# Patient Record
Sex: Male | Born: 1955 | Race: White | Hispanic: No | Marital: Married | Smoking: Former smoker
Health system: Southern US, Academic
[De-identification: ages and names within clinical notes are randomized; demographics above are authoritative.]

## PROBLEM LIST (undated history)

## (undated) DIAGNOSIS — D649 Anemia, unspecified: Secondary | ICD-10-CM

## (undated) DIAGNOSIS — E119 Type 2 diabetes mellitus without complications: Secondary | ICD-10-CM

## (undated) DIAGNOSIS — Z872 Personal history of diseases of the skin and subcutaneous tissue: Secondary | ICD-10-CM

## (undated) DIAGNOSIS — K635 Polyp of colon: Secondary | ICD-10-CM

## (undated) DIAGNOSIS — R9430 Abnormal result of cardiovascular function study, unspecified: Secondary | ICD-10-CM

## (undated) DIAGNOSIS — R339 Retention of urine, unspecified: Secondary | ICD-10-CM

## (undated) DIAGNOSIS — I2510 Atherosclerotic heart disease of native coronary artery without angina pectoris: Secondary | ICD-10-CM

## (undated) DIAGNOSIS — Z9989 Dependence on other enabling machines and devices: Secondary | ICD-10-CM

## (undated) DIAGNOSIS — L03119 Cellulitis of unspecified part of limb: Secondary | ICD-10-CM

## (undated) DIAGNOSIS — G629 Polyneuropathy, unspecified: Secondary | ICD-10-CM

## (undated) DIAGNOSIS — M109 Gout, unspecified: Secondary | ICD-10-CM

## (undated) DIAGNOSIS — R931 Abnormal findings on diagnostic imaging of heart and coronary circulation: Secondary | ICD-10-CM

## (undated) DIAGNOSIS — N183 Chronic kidney disease, stage 3 unspecified (CMS HCC): Secondary | ICD-10-CM

## (undated) DIAGNOSIS — Z7901 Long term (current) use of anticoagulants: Secondary | ICD-10-CM

## (undated) DIAGNOSIS — K5900 Constipation, unspecified: Secondary | ICD-10-CM

## (undated) DIAGNOSIS — Z950 Presence of cardiac pacemaker: Secondary | ICD-10-CM

## (undated) DIAGNOSIS — R609 Edema, unspecified: Secondary | ICD-10-CM

## (undated) DIAGNOSIS — M1990 Unspecified osteoarthritis, unspecified site: Secondary | ICD-10-CM

## (undated) DIAGNOSIS — I509 Heart failure, unspecified: Secondary | ICD-10-CM

## (undated) DIAGNOSIS — I4891 Unspecified atrial fibrillation: Secondary | ICD-10-CM

## (undated) DIAGNOSIS — I429 Cardiomyopathy, unspecified: Secondary | ICD-10-CM

## (undated) DIAGNOSIS — I10 Essential (primary) hypertension: Secondary | ICD-10-CM

## (undated) DIAGNOSIS — E785 Hyperlipidemia, unspecified: Secondary | ICD-10-CM

## (undated) HISTORY — PX: HX PACEMAKER DEFIBRILLATOR PLACEMENT: SHX43

## (undated) HISTORY — PX: HX COLONOSCOPY: 2100001147

## (undated) HISTORY — PX: HX CORONARY ARTERY BYPASS GRAFT: SHX141

## (undated) HISTORY — PX: HX CARDIAC ABLATION: 2100001323

---
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
## 2013-08-28 DIAGNOSIS — M869 Osteomyelitis, unspecified: Secondary | ICD-10-CM

## 2013-08-28 HISTORY — DX: Osteomyelitis, unspecified (CMS HCC): M86.9

## 2017-06-15 DIAGNOSIS — K8020 Calculus of gallbladder without cholecystitis without obstruction: Secondary | ICD-10-CM | POA: Insufficient documentation

## 2017-11-10 NOTE — Care Management Notes (Signed)
MARS INTAKE    Insurance Information:    Referring Provider and Contact Number:  Jerrell Belfast NP  Transfer Source and Contact Number:   Eula Listen [phone]  Transfer Emergent: emergent     Date Admitted:  11/10/2017  Diagnosis:  Urechal cyst, urinary retention , hypotensive   PMH:  DM2, A-Fib, Obesity, CHF, CKD, Pacemaker 2015      Dialysis:  no     Cancer:  no     Isolation:  no     Is patient established with family practice/attending?  no  Reason for transfer:  Higher level of care     Patient story/clinical presentation: As per Jerrell Belfast NP, patient admitted 11/10/2017 for UTI and hypotensive. Patient improving after getting NS boluses currently 119/93. CT scan shown urechal cyst. Patient had a few cc in bladder and unable to void. Staff unable to get foley placed. Morris NP spoke with Dr. Matthew Folks who recommended medicine admission. Patient Flu negative as well. Patient accepted to medicine step down.     qSOFA Score >= 2 = Positive                 Value                   Score    Respiratory Rate >= 22 breaths per minute = 1 point 20 0   Systolic Blood Pressure <= 100 mmHg = 1 point 119 0   Altered Mental Status: GCS <15 = 1 point Lethargic (improving with fluids) 1   Total Score   1     Serum Lactate Level >= 13mol/L (36 mg/ dL)= Positive:  Initial call level (if not available put NA) 3.0/2.6   Followed up call level        Positive Screen: no    Current vital signs:    HR:     77   BP:     119/93   Resp:     20   Temp:     38.0   Sats:     95   O2:     2LNC       Per MD labs WNL unless noted below.    Labs:  WBC (3.6-11) 10.4   HGB (13.1-17.3)    Hct (39.8-50.2)    PLT (140-400)    Na (135-145)    K+ (3.5-5.1)    CL (96-111)    CO2 (23-35)    BUN (8-26)    Cr (0.62-1.27) 3.6   Glucose (60-105)    Ca (8.5-10.4)    Mag (1.6-2.5)    Phos (2.4-4.7)    BNP (<100)    D-Dimer (<233)    AST (8-41)    ALT (<55)    Alk Phos (<150)    Amylase (25-125)    Lipase (10-80)    T. Bili (0.3-1.3)    PT (8.7-13.2)    PTT  (25.1-36.5)    INR (0.8-1.2) 2.0   Trip-1 (<0.03)    CK    CPK    CK-MB    ABG ph    ABG PCO2    ABG PO2    ABG HCO3    Base def/exc    LP Glucose    LP Neutrophil    LP Protein    LP WBC    LP Other            Radiology (please place images on image grid): CT scan abdomen shown 4.2 structure at dome  of bladder, urechal cyst  EKG:  IV Access:  Medications, IVF, Drips: Zosyn 3.75, Vanc 1g, NS boluses     Oxygen/Bipap/Vent settings:    TV:        Peep:        FiO2:        Rate:            Pt class and accommodation: inpatient, step down   Service and accepting MD: medicine. Dr. Westley Hummer    *Send Text Page to accepting service MD. *

## 2017-11-11 ENCOUNTER — Inpatient Hospital Stay (HOSPITAL_COMMUNITY): Payer: No Typology Code available for payment source

## 2017-11-11 ENCOUNTER — Encounter (HOSPITAL_COMMUNITY): Payer: Self-pay | Admitting: Anatomic Pathology & Clinical Pathology

## 2017-11-11 ENCOUNTER — Inpatient Hospital Stay (HOSPITAL_COMMUNITY): Payer: No Typology Code available for payment source | Admitting: Pediatrics

## 2017-11-11 ENCOUNTER — Inpatient Hospital Stay
Admission: EM | Admit: 2017-11-11 | Discharge: 2017-11-22 | DRG: 871 | Disposition: A | Discharge status: Home Health | Payer: No Typology Code available for payment source | Source: Other Acute Inpatient Hospital | Attending: Internal Medicine | Admitting: Internal Medicine

## 2017-11-11 DIAGNOSIS — E1122 Type 2 diabetes mellitus with diabetic chronic kidney disease: Secondary | ICD-10-CM | POA: Diagnosis present

## 2017-11-11 DIAGNOSIS — R578 Other shock: Secondary | ICD-10-CM

## 2017-11-11 DIAGNOSIS — I272 Pulmonary hypertension, unspecified: Secondary | ICD-10-CM | POA: Diagnosis present

## 2017-11-11 DIAGNOSIS — I130 Hypertensive heart and chronic kidney disease with heart failure and stage 1 through stage 4 chronic kidney disease, or unspecified chronic kidney disease: Secondary | ICD-10-CM | POA: Diagnosis present

## 2017-11-11 DIAGNOSIS — Z7901 Long term (current) use of anticoagulants: Secondary | ICD-10-CM

## 2017-11-11 DIAGNOSIS — N179 Acute kidney failure, unspecified: Secondary | ICD-10-CM

## 2017-11-11 DIAGNOSIS — R339 Retention of urine, unspecified: Secondary | ICD-10-CM

## 2017-11-11 DIAGNOSIS — K8020 Calculus of gallbladder without cholecystitis without obstruction: Secondary | ICD-10-CM

## 2017-11-11 DIAGNOSIS — K828 Other specified diseases of gallbladder: Secondary | ICD-10-CM

## 2017-11-11 DIAGNOSIS — N401 Enlarged prostate with lower urinary tract symptoms: Secondary | ICD-10-CM | POA: Diagnosis present

## 2017-11-11 DIAGNOSIS — M7981 Nontraumatic hematoma of soft tissue: Secondary | ICD-10-CM | POA: Diagnosis present

## 2017-11-11 DIAGNOSIS — L97319 Non-pressure chronic ulcer of right ankle with unspecified severity: Secondary | ICD-10-CM | POA: Diagnosis present

## 2017-11-11 DIAGNOSIS — N170 Acute kidney failure with tubular necrosis: Secondary | ICD-10-CM | POA: Diagnosis present

## 2017-11-11 DIAGNOSIS — E11622 Type 2 diabetes mellitus with other skin ulcer: Secondary | ICD-10-CM | POA: Diagnosis present

## 2017-11-11 DIAGNOSIS — E440 Moderate protein-calorie malnutrition: Secondary | ICD-10-CM | POA: Diagnosis present

## 2017-11-11 DIAGNOSIS — E1142 Type 2 diabetes mellitus with diabetic polyneuropathy: Secondary | ICD-10-CM | POA: Diagnosis present

## 2017-11-11 DIAGNOSIS — R6521 Severe sepsis with septic shock: Secondary | ICD-10-CM

## 2017-11-11 DIAGNOSIS — I255 Ischemic cardiomyopathy: Secondary | ICD-10-CM | POA: Diagnosis present

## 2017-11-11 DIAGNOSIS — Z6841 Body Mass Index (BMI) 40.0 and over, adult: Secondary | ICD-10-CM

## 2017-11-11 DIAGNOSIS — Z79899 Other long term (current) drug therapy: Secondary | ICD-10-CM

## 2017-11-11 DIAGNOSIS — I509 Heart failure, unspecified: Secondary | ICD-10-CM

## 2017-11-11 DIAGNOSIS — S81811A Laceration without foreign body, right lower leg, initial encounter: Secondary | ICD-10-CM | POA: Diagnosis present

## 2017-11-11 DIAGNOSIS — L03115 Cellulitis of right lower limb: Secondary | ICD-10-CM | POA: Diagnosis present

## 2017-11-11 DIAGNOSIS — Z794 Long term (current) use of insulin: Secondary | ICD-10-CM

## 2017-11-11 DIAGNOSIS — T83098A Other mechanical complication of other indwelling urethral catheter, initial encounter: Secondary | ICD-10-CM

## 2017-11-11 DIAGNOSIS — E7800 Pure hypercholesterolemia, unspecified: Secondary | ICD-10-CM | POA: Diagnosis present

## 2017-11-11 DIAGNOSIS — N189 Chronic kidney disease, unspecified: Secondary | ICD-10-CM

## 2017-11-11 DIAGNOSIS — E785 Hyperlipidemia, unspecified: Secondary | ICD-10-CM | POA: Diagnosis present

## 2017-11-11 DIAGNOSIS — I890 Lymphedema, not elsewhere classified: Secondary | ICD-10-CM | POA: Diagnosis present

## 2017-11-11 DIAGNOSIS — I2510 Atherosclerotic heart disease of native coronary artery without angina pectoris: Secondary | ICD-10-CM

## 2017-11-11 DIAGNOSIS — Z7952 Long term (current) use of systemic steroids: Secondary | ICD-10-CM

## 2017-11-11 DIAGNOSIS — R9431 Abnormal electrocardiogram [ECG] [EKG]: Secondary | ICD-10-CM

## 2017-11-11 DIAGNOSIS — Z95810 Presence of automatic (implantable) cardiac defibrillator: Secondary | ICD-10-CM

## 2017-11-11 DIAGNOSIS — Z7982 Long term (current) use of aspirin: Secondary | ICD-10-CM

## 2017-11-11 DIAGNOSIS — I959 Hypotension, unspecified: Secondary | ICD-10-CM

## 2017-11-11 DIAGNOSIS — R570 Cardiogenic shock: Secondary | ICD-10-CM | POA: Diagnosis present

## 2017-11-11 DIAGNOSIS — Z9861 Coronary angioplasty status: Secondary | ICD-10-CM

## 2017-11-11 DIAGNOSIS — I5023 Acute on chronic systolic (congestive) heart failure: Secondary | ICD-10-CM | POA: Diagnosis not present

## 2017-11-11 DIAGNOSIS — Z951 Presence of aortocoronary bypass graft: Secondary | ICD-10-CM

## 2017-11-11 DIAGNOSIS — Z87891 Personal history of nicotine dependence: Secondary | ICD-10-CM

## 2017-11-11 DIAGNOSIS — N183 Chronic kidney disease, stage 3 (moderate): Secondary | ICD-10-CM

## 2017-11-11 DIAGNOSIS — T45515A Adverse effect of anticoagulants, initial encounter: Secondary | ICD-10-CM | POA: Diagnosis present

## 2017-11-11 DIAGNOSIS — M549 Dorsalgia, unspecified: Secondary | ICD-10-CM | POA: Diagnosis not present

## 2017-11-11 DIAGNOSIS — I10 Essential (primary) hypertension: Secondary | ICD-10-CM

## 2017-11-11 DIAGNOSIS — I472 Ventricular tachycardia: Secondary | ICD-10-CM | POA: Diagnosis not present

## 2017-11-11 DIAGNOSIS — R748 Abnormal levels of other serum enzymes: Secondary | ICD-10-CM

## 2017-11-11 DIAGNOSIS — A419 Sepsis, unspecified organism: Principal | ICD-10-CM | POA: Diagnosis present

## 2017-11-11 DIAGNOSIS — I517 Cardiomegaly: Secondary | ICD-10-CM

## 2017-11-11 DIAGNOSIS — R338 Other retention of urine: Secondary | ICD-10-CM | POA: Diagnosis present

## 2017-11-11 DIAGNOSIS — E876 Hypokalemia: Secondary | ICD-10-CM | POA: Diagnosis not present

## 2017-11-11 DIAGNOSIS — E1165 Type 2 diabetes mellitus with hyperglycemia: Secondary | ICD-10-CM | POA: Diagnosis present

## 2017-11-11 DIAGNOSIS — I872 Venous insufficiency (chronic) (peripheral): Secondary | ICD-10-CM

## 2017-11-11 DIAGNOSIS — N390 Urinary tract infection, site not specified: Secondary | ICD-10-CM | POA: Diagnosis present

## 2017-11-11 DIAGNOSIS — I482 Chronic atrial fibrillation: Secondary | ICD-10-CM | POA: Diagnosis present

## 2017-11-11 DIAGNOSIS — M069 Rheumatoid arthritis, unspecified: Secondary | ICD-10-CM | POA: Diagnosis present

## 2017-11-11 DIAGNOSIS — R1310 Dysphagia, unspecified: Secondary | ICD-10-CM | POA: Diagnosis present

## 2017-11-11 DIAGNOSIS — I4891 Unspecified atrial fibrillation: Secondary | ICD-10-CM

## 2017-11-11 DIAGNOSIS — G4733 Obstructive sleep apnea (adult) (pediatric): Secondary | ICD-10-CM | POA: Diagnosis present

## 2017-11-11 DIAGNOSIS — R162 Hepatomegaly with splenomegaly, not elsewhere classified: Secondary | ICD-10-CM

## 2017-11-11 DIAGNOSIS — E119 Type 2 diabetes mellitus without complications: Secondary | ICD-10-CM

## 2017-11-11 DIAGNOSIS — M109 Gout, unspecified: Secondary | ICD-10-CM | POA: Diagnosis present

## 2017-11-11 DIAGNOSIS — N12 Tubulo-interstitial nephritis, not specified as acute or chronic: Secondary | ICD-10-CM | POA: Diagnosis present

## 2017-11-11 DIAGNOSIS — N329 Bladder disorder, unspecified: Secondary | ICD-10-CM

## 2017-11-11 DIAGNOSIS — N35919 Unspecified urethral stricture, male, unspecified site: Secondary | ICD-10-CM | POA: Diagnosis present

## 2017-11-11 DIAGNOSIS — D649 Anemia, unspecified: Secondary | ICD-10-CM | POA: Diagnosis not present

## 2017-11-11 HISTORY — DX: Chronic kidney disease, stage 3 unspecified (CMS HCC): N18.30

## 2017-11-11 HISTORY — DX: Constipation, unspecified: K59.00

## 2017-11-11 HISTORY — DX: Edema, unspecified: R60.9

## 2017-11-11 HISTORY — DX: Retention of urine, unspecified: R33.9

## 2017-11-11 HISTORY — DX: Polyp of colon: K63.5

## 2017-11-11 HISTORY — DX: Cellulitis of unspecified part of limb: L03.119

## 2017-11-11 HISTORY — DX: Presence of cardiac pacemaker: Z95.0

## 2017-11-11 HISTORY — DX: Essential (primary) hypertension: I10

## 2017-11-11 HISTORY — DX: Unspecified atrial fibrillation (CMS HCC): I48.91

## 2017-11-11 HISTORY — DX: Unspecified osteoarthritis, unspecified site: M19.90

## 2017-11-11 HISTORY — DX: Long term (current) use of anticoagulants: Z79.01

## 2017-11-11 HISTORY — DX: Heart failure, unspecified (CMS HCC): I50.9

## 2017-11-11 HISTORY — DX: Anemia, unspecified: D64.9

## 2017-11-11 HISTORY — DX: Gout, unspecified: M10.9

## 2017-11-11 HISTORY — DX: Polyneuropathy, unspecified: G62.9

## 2017-11-11 HISTORY — DX: Morbid (severe) obesity due to excess calories (CMS HCC): E66.01

## 2017-11-11 HISTORY — DX: Type 2 diabetes mellitus without complications (CMS HCC): E11.9

## 2017-11-11 HISTORY — DX: Atherosclerotic heart disease of native coronary artery without angina pectoris: I25.10

## 2017-11-11 HISTORY — DX: Personal history of diseases of the skin and subcutaneous tissue: Z87.2

## 2017-11-11 HISTORY — DX: Hyperlipidemia, unspecified: E78.5

## 2017-11-11 HISTORY — DX: Dependence on other enabling machines and devices: Z99.89

## 2017-11-11 HISTORY — DX: Cardiomyopathy, unspecified (CMS HCC): I42.9

## 2017-11-11 HISTORY — DX: Abnormal result of cardiovascular function study, unspecified: R94.30

## 2017-11-11 LAB — TYPE AND SCREEN
ABO/RH(D): A NEG
ANTIBODY SCREEN: NEGATIVE

## 2017-11-11 LAB — URINALYSIS, MICROSCOPIC
HYALINE CASTS: 4 /lpf — ABNORMAL HIGH (ref <4.0)
RBCS: 3 /hpf (ref <6.0)
WBCS: 22 /hpf — ABNORMAL HIGH (ref <4.0)

## 2017-11-11 LAB — HEPATIC FUNCTION PANEL
ALBUMIN: 2.6 g/dL — ABNORMAL LOW (ref 3.4–4.8)
ALBUMIN: 2.6 g/dL — ABNORMAL LOW (ref 3.4–4.8)
ALKALINE PHOSPHATASE: 93 U/L (ref <150)
ALKALINE PHOSPHATASE: 93 U/L (ref <150)
ALT (SGPT): 10 U/L (ref <55)
ALT (SGPT): 9 U/L (ref <55)
AST (SGOT): 35 U/L (ref 8–48)
AST (SGOT): 36 U/L (ref 8–48)
BILIRUBIN DIRECT: 2.1 mg/dL — ABNORMAL HIGH (ref <0.3)
BILIRUBIN DIRECT: 2.2 mg/dL — ABNORMAL HIGH (ref <0.3)
BILIRUBIN TOTAL: 3 mg/dL — ABNORMAL HIGH (ref 0.3–1.3)
BILIRUBIN TOTAL: 2.9 mg/dL — ABNORMAL HIGH (ref 0.3–1.3)
PROTEIN TOTAL: 6.3 g/dL (ref 6.0–8.0)
PROTEIN TOTAL: 6.1 g/dL (ref 6.0–8.0)

## 2017-11-11 LAB — VENOUS BLOOD GAS/LACTATE
%FIO2 (VENOUS): 32 %
BASE DEFICIT: 0.8 mmol/L (ref <=3.0)
BICARBONATE (VENOUS): 23.5 mmol/L (ref 22.0–26.0)
LACTATE: 1.8 mmol/L — ABNORMAL HIGH (ref 0.0–1.3)
PCO2 (VENOUS): 44 mm/Hg (ref 41.00–51.00)
PH (VENOUS): 7.36 (ref 7.31–7.41)
PO2 (VENOUS): 37 mm/Hg (ref 35.0–50.0)

## 2017-11-11 LAB — TROPONIN-I
TROPONIN I: 78 ng/L — ABNORMAL HIGH (ref 0–30)
TROPONIN I: 56 ng/L — ABNORMAL HIGH (ref 0–30)

## 2017-11-11 LAB — CBC WITH DIFF
BASOPHIL #: 0.05 x10ˆ3/uL (ref 0.00–0.20)
EOSINOPHIL #: 0 x10ˆ3/uL (ref 0.00–0.50)
HGB: 12.3 g/dL — ABNORMAL LOW (ref 12.5–16.3)
LYMPHOCYTE #: 0.28 x10ˆ3/uL — ABNORMAL LOW (ref 1.00–4.80)
MCH: 32 pg (ref 27.4–33.0)
MCHC: 33.3 g/dL (ref 32.5–35.8)
MCV: 96.2 fL (ref 78.0–100.0)
MONOCYTE #: 0.49 x10ˆ3/uL (ref 0.30–1.00)
MPV: 9.1 fL (ref 7.5–11.5)
NEUTROPHIL #: 8.87 x10ˆ3/uL — ABNORMAL HIGH (ref 1.50–7.70)
PLATELETS: 108 x10ˆ3/uL — ABNORMAL LOW (ref 140–450)
RBC: 3.83 x10ˆ6/uL — ABNORMAL LOW (ref 4.06–5.63)
RDW: 19.5 % — ABNORMAL HIGH (ref 12.0–15.0)

## 2017-11-11 LAB — URINALYSIS, MACROSCOPIC
BILIRUBIN: NEGATIVE mg/dL
NITRITE: NEGATIVE
PH: 6 (ref 5.0–8.0)
PROTEIN: 30 mg/dL — AB

## 2017-11-11 LAB — FIBRINOGEN: FIBRINOGEN: 550 mg/dL — ABNORMAL HIGH (ref 200–400)

## 2017-11-11 LAB — BASIC METABOLIC PANEL
ANION GAP: 12 mmol/L (ref 4–13)
BUN/CREA RATIO: 24 — ABNORMAL HIGH (ref 6–22)
BUN: 88 mg/dL — ABNORMAL HIGH (ref 8–25)
CALCIUM: 8.1 mg/dL — ABNORMAL LOW (ref 8.5–10.2)
CHLORIDE: 102 mmol/L (ref 96–111)
CO2 TOTAL: 23 mmol/L (ref 22–32)
CREATININE: 3.71 mg/dL — ABNORMAL HIGH (ref 0.62–1.27)
ESTIMATED GFR: 17 mL/min/1.73mˆ2 — ABNORMAL LOW (ref >59)
GLUCOSE: 212 mg/dL — ABNORMAL HIGH (ref 65–139)
POTASSIUM: 4.7 mmol/L (ref 3.5–5.1)
SODIUM: 137 mmol/L (ref 136–145)

## 2017-11-11 LAB — CLOSTRIDIUM DIFFICILE TOXIN DETECTION: CLOSTRIDIUM DIFFICILE TOXIN DETECTION: NEGATIVE

## 2017-11-11 LAB — PT/INR
INR: 2.23 — ABNORMAL HIGH (ref 0.80–1.20)
PROTHROMBIN TIME: 25.7 seconds — ABNORMAL HIGH (ref 9.5–14.1)

## 2017-11-11 LAB — POC BLOOD GLUCOSE (RESULTS): GLUCOSE, POC: 177 mg/dl — ABNORMAL HIGH (ref 70–105)

## 2017-11-11 LAB — PTT (PARTIAL THROMBOPLASTIN TIME): APTT: 82 seconds — ABNORMAL HIGH (ref 24.1–38.5)

## 2017-11-11 LAB — CREATININE URINE, RANDOM: CREATININE RANDOM URINE: 76 mg/dL

## 2017-11-11 LAB — SODIUM, RANDOM URINE: SODIUM RANDOM URINE: 29 mmol/L

## 2017-11-11 LAB — PROCALCITONIN: PROCALCITONIN: 15.54 ng/mL — ABNORMAL HIGH (ref <0.50)

## 2017-11-11 LAB — DIGOXIN LEVEL: DIGOXIN LEVEL: 0.6 ng/mL — ABNORMAL LOW (ref 0.8–2.0)

## 2017-11-11 LAB — MRSA SCREEN, PCR, RAPID: MRSA COLONIZATION SCREEN: NEGATIVE

## 2017-11-11 LAB — UREA NITROGEN, RANDOM URINE: UREA NITROGEN RANDOM URINE: 778 mg/dL

## 2017-11-11 LAB — C-REACTIVE PROTEIN(CRP),INFLAMMATION: CRP INFLAMMATION: 262.4 mg/L — ABNORMAL HIGH (ref <=8.0)

## 2017-11-11 MED ORDER — SENNOSIDES 8.6 MG-DOCUSATE SODIUM 50 MG TABLET
1.0000 | ORAL_TABLET | Freq: Two times a day (BID) | ORAL | Status: DC
Start: 2017-11-11 — End: 2017-11-22
  Administered 2017-11-11: 0 via ORAL
  Administered 2017-11-11 – 2017-11-12 (×3): 1 via ORAL
  Administered 2017-11-13 – 2017-11-15 (×5): 0 via ORAL
  Administered 2017-11-16 – 2017-11-20 (×9): 1 via ORAL
  Administered 2017-11-20: 0 via ORAL
  Administered 2017-11-21: 1 via ORAL
  Administered 2017-11-21 – 2017-11-22 (×2): 0 via ORAL
  Filled 2017-11-11 (×14): qty 1

## 2017-11-11 MED ORDER — SODIUM CHLORIDE 0.9 % (FLUSH) INJECTION SYRINGE
2.0000 mL | INJECTION | Freq: Three times a day (TID) | INTRAMUSCULAR | Status: DC
Start: 2017-11-11 — End: 2017-11-12
  Administered 2017-11-11: 0 mL
  Administered 2017-11-11 (×2): 2 mL
  Administered 2017-11-12 (×2): 0 mL

## 2017-11-11 MED ORDER — NOREPINEPHRINE BITARTRATE 16 MG/250 ML (64 MCG/ML) IN 0.9 % NACL IV
0.0300 ug/kg/min | INTRAVENOUS | Status: DC
Start: 2017-11-11 — End: 2017-11-12
  Administered 2017-11-11 (×3): 0.08 ug/kg/min via INTRAVENOUS
  Administered 2017-11-12: 0.03 ug/kg/min via INTRAVENOUS
  Administered 2017-11-12: 0.08 ug/kg/min via INTRAVENOUS
  Administered 2017-11-12: 0 ug/kg/min via INTRAVENOUS
  Administered 2017-11-12: 0.08 ug/kg/min via INTRAVENOUS
  Administered 2017-11-12: 0.06 ug/kg/min via INTRAVENOUS
  Administered 2017-11-12: 0.04 ug/kg/min via INTRAVENOUS
  Administered 2017-11-12: 0.08 ug/kg/min via INTRAVENOUS
  Filled 2017-11-11 (×2): qty 250

## 2017-11-11 MED ORDER — HYDROMORPHONE 2 MG/ML INJECTION SYRINGE
0.6000 mg | INJECTION | Freq: Once | INTRAMUSCULAR | Status: AC | PRN
Start: 2017-11-11 — End: 2017-11-11

## 2017-11-11 MED ORDER — SODIUM CHLORIDE 0.9 % (FLUSH) INJECTION SYRINGE
2.0000 mL | INJECTION | INTRAMUSCULAR | Status: DC | PRN
Start: 2017-11-11 — End: 2017-11-12

## 2017-11-11 MED ORDER — SODIUM CHLORIDE 0.9 % (FLUSH) INJECTION SYRINGE
2.0000 mL | INJECTION | INTRAMUSCULAR | Status: DC | PRN
Start: 2017-11-11 — End: 2017-11-22

## 2017-11-11 MED ORDER — INSULIN LISPRO 100 UNIT/ML SUB-Q SSIP
3.00 [IU] | INJECTION | Freq: Four times a day (QID) | SUBCUTANEOUS | Status: DC | PRN
Start: 2017-11-11 — End: 2017-11-14
  Administered 2017-11-11: 6 [IU] via SUBCUTANEOUS
  Administered 2017-11-11 – 2017-11-12 (×3): 3 [IU] via SUBCUTANEOUS
  Administered 2017-11-14: 9 [IU] via SUBCUTANEOUS
  Administered 2017-11-14: 3 [IU] via SUBCUTANEOUS
  Filled 2017-11-11: qty 3

## 2017-11-11 MED ORDER — CEFEPIME 2 GRAM/100 ML IN DEXTROSE PREMIX IVPB - EXTENDED INFUSION
2.0000 g | INJECTION | Freq: Once | INTRAVENOUS | Status: AC
Start: 2017-11-11 — End: 2017-11-11
  Administered 2017-11-11: 2 g via INTRAVENOUS
  Administered 2017-11-11: 0 g via INTRAVENOUS
  Filled 2017-11-11: qty 100

## 2017-11-11 MED ORDER — NOREPINEPHRINE BITARTRATE 1 MG/ML INTRAVENOUS SOLUTION
INTRAVENOUS | Status: AC
Start: 2017-11-11 — End: 2017-11-11
  Filled 2017-11-11: qty 12

## 2017-11-11 MED ORDER — PERFLUTREN LIPID MICROSPHERES 1.1 MG/ML INTRAVENOUS SUSPENSION
2.00 mL | INTRAVENOUS | Status: AC
Start: 2017-11-11 — End: 2017-11-12
  Administered 2017-11-12: 2 mL via INTRAVENOUS

## 2017-11-11 MED ORDER — LINEZOLID 600 MG TABLET
600.0000 mg | ORAL_TABLET | Freq: Two times a day (BID) | ORAL | Status: DC
Start: 2017-11-11 — End: 2017-11-13
  Administered 2017-11-11 – 2017-11-13 (×5): 600 mg via ORAL
  Filled 2017-11-11 (×6): qty 1

## 2017-11-11 MED ORDER — SODIUM CHLORIDE 0.9 % (FLUSH) INJECTION SYRINGE
2.0000 mL | INJECTION | Freq: Three times a day (TID) | INTRAMUSCULAR | Status: DC
Start: 2017-11-11 — End: 2017-11-22
  Administered 2017-11-11: 0 mL
  Administered 2017-11-11 – 2017-11-12 (×3): 2 mL
  Administered 2017-11-12: 0 mL
  Administered 2017-11-12: 2 mL
  Administered 2017-11-13 (×2): 0 mL
  Administered 2017-11-13: 2 mL
  Administered 2017-11-14: 0 mL
  Administered 2017-11-14 – 2017-11-16 (×7): 2 mL
  Administered 2017-11-16 – 2017-11-17 (×2): 0 mL
  Administered 2017-11-17: 2 mL
  Administered 2017-11-17: 0 mL
  Administered 2017-11-18: 10 mL
  Administered 2017-11-18 (×2): 0 mL
  Administered 2017-11-19 – 2017-11-20 (×5): 2 mL
  Administered 2017-11-21: 0 mL
  Administered 2017-11-21 – 2017-11-22 (×3): 2 mL

## 2017-11-11 MED ORDER — DIGOXIN 125 MCG (0.125 MG) TABLET
125.0000 ug | ORAL_TABLET | Freq: Every day | ORAL | Status: DC
Start: 2017-11-12 — End: 2017-11-22
  Administered 2017-11-12 – 2017-11-22 (×11): 125 ug via ORAL
  Filled 2017-11-11 (×11): qty 1

## 2017-11-11 MED ORDER — HYDROMORPHONE 2 MG/ML INJECTION SYRINGE
INJECTION | INTRAMUSCULAR | Status: AC
Start: 2017-11-11 — End: 2017-11-11
  Administered 2017-11-11: 12:00:00 0.6 mg via INTRAVENOUS
  Filled 2017-11-11: qty 1

## 2017-11-11 MED ORDER — PIPERACILLIN-TAZOBACTAM 3.375 GRAM/50 ML DEXTROSE(ISO-OS) IV PIGGYBACK
3.3750 g | INJECTION | Freq: Three times a day (TID) | INTRAVENOUS | Status: DC
Start: 2017-11-11 — End: 2017-11-11

## 2017-11-11 MED ORDER — ELECTROLYTE-A INTRAVENOUS SOLUTION BOLUS
1000.00 mL | INTRAVENOUS | Status: AC
Start: 2017-11-11 — End: 2017-11-11
  Administered 2017-11-11: 1000 mL via INTRAVENOUS
  Administered 2017-11-11: 0 mL via INTRAVENOUS

## 2017-11-11 MED ORDER — IOVERSOL 320 MG IODINE/ML INTRAVENOUS SOLUTION
30.00 mL | INTRAVENOUS | Status: AC
Start: 2017-11-11 — End: 2017-11-11
  Administered 2017-11-11: 30 mL via INTRAVESICAL

## 2017-11-11 MED ORDER — DOCUSATE SODIUM 100 MG CAPSULE
100.0000 mg | ORAL_CAPSULE | Freq: Two times a day (BID) | ORAL | Status: DC
Start: 2017-11-11 — End: 2017-11-22
  Administered 2017-11-11: 100 mg via ORAL
  Administered 2017-11-11: 0 mg via ORAL
  Administered 2017-11-12: 100 mg via ORAL
  Administered 2017-11-13 – 2017-11-15 (×6): 0 mg via ORAL
  Administered 2017-11-16 – 2017-11-20 (×9): 100 mg via ORAL
  Administered 2017-11-20 – 2017-11-22 (×3): 0 mg via ORAL
  Filled 2017-11-11 (×14): qty 1

## 2017-11-11 MED ORDER — WARFARIN 5 MG TABLET
5.0000 mg | ORAL_TABLET | Freq: Every day | ORAL | Status: DC
Start: 2017-11-11 — End: 2017-11-12
  Administered 2017-11-11: 5 mg via ORAL
  Filled 2017-11-11 (×2): qty 1

## 2017-11-11 MED ORDER — PIPERACILLIN-TAZOBACTAM 4.5 GRAM/100 ML DEXTROSE(ISO-OSM) IV PIGGYBACK
4.5000 g | INJECTION | Freq: Once | INTRAVENOUS | Status: DC
Start: 2017-11-11 — End: 2017-11-11

## 2017-11-11 MED ORDER — CEFEPIME 2 GRAM/100 ML IN DEXTROSE PREMIX IVPB - EXTENDED INFUSION
2.0000 g | INJECTION | INTRAVENOUS | Status: DC
Start: 2017-11-12 — End: 2017-11-12
  Administered 2017-11-12: 0 g via INTRAVENOUS
  Filled 2017-11-11: qty 100

## 2017-11-11 MED ADMIN — ketorolac 30 mg/mL (1 mL) injection solution: INTRAVENOUS | @ 13:00:00

## 2017-11-11 MED ADMIN — nystatin 100,000 unit/gram topical powder: INTRAVENOUS | @ 13:00:00 | NDC 00574200815

## 2017-11-11 NOTE — Nurses Notes (Signed)
MICU-2 resident paged about pt 16 beat run of vtach, asymptomatic. BP 101/66. No new orders at this time, will continue to monitor.

## 2017-11-11 NOTE — Care Management Notes (Signed)
QSOFA SCREENING    qSOFA Score >= 2 = Positive              Value               Score    Respiratory Rate >= 22 breaths per minute = 1 point 20 0   Systolic Blood Pressure <= 100 mmHg = 1 point 64/46 1   Altered Mental Status: GCS <15 = 1 point yes 1   Total Score  2     Serum Lactate Level > = 47mmol/L (36 mg/ dL)= Positive:  Initial call level (if not available put NA) 3.0   Followed up call level 2.6        Positive Screen: yes  Antibiotics:  yes - Vancomycin, Zosyn  Fluids/Pressor:  yes - 2L NS  Blood Cultures:  yes - Pending  Serum Lactate:   yes - 3.0 - 2.6    Jerrell Belfast, NP @ [facility] ICU spoke with Dr. Silas Flood, MICU Fellow. Patient accepted to [facility] for Septic Shock and UTI.      Steve Rattler, RN

## 2017-11-11 NOTE — Procedures (Signed)
Difficult Foley Catheter Placement (URO)   Procedure Date: 11/11/2017 Procedure Time: 12:45     Indication: Difficult Foley Catheter Placement per primary team    Procedure:   1.  Urethral dilation (10-20 Fr) with Heyman dilators  2.  MD Foley Catheter Placement    Requesting Service: MICU    Reason For Consult: unable to pass catheter due to resistance at tip of penis    ICU Status: YES  Cystoscopy Required: NO  Dilation Required: YES  Guidewire Required: YES  Catheter Placed: 16 Fr council  Reason for Difficulty by Primary Team/Nursing (determined by Urologist): fossa navicularis stricture    Level of Difficulty: 5  1 Foley from Kit placed (16 or 18 Fr Latex)   2 Different Catheter Placed, no intervention   3 Difficult Exposure (Edema, Phimosis, Retropubic Urethra in Fem)   4 Guidewire Required (Council or modified Council Catheter)   5 Dilation Required (Heyman, S-curve, or dilation meatus)   6 Cystoscopy Required (Bedside)   7 Failed (Required SPT or Aspiration/OR)       DESCRIPTION OF PROCEDURE: The risks and benefits of Foley catheter placement were discussed with the patient who expressed understanding and verbally agreed to continuing with the procedure. He was laid in the supine position and his genitalia were prepped and draped in the standard sterile fashion. Ten mL of 2% Lidocaine Uro-Jet were infused into the urethra. Using strict sterile technique, a standard zip wire was inserted atraumatically through the meatus and into the bladder without resistance.  Next,    Serial dilatation from 10-20 [place] with Heyman dilators was performed.  A 16 French council Foley catheter was then placed over the wire and through urethral meatus and into bladder with no resistance encountered. Immediate return of clear-yellow urine was noted. The balloon was inflated with 10 mL of sterile normal saline. The catheter was then attached to a urinary drainage bag and the catheter was secured using a stat lock. The patient  tolerated this procedure well with no known complications. Dr. Thurmon Fair was present for the entire procedure.    Lavon Paganini. Christoper Fabian, MD  Resident, PGY-2  [facility] Department of Urology  Pager [phone]  11/11/2017, 12:45    I was present, participated and supervised the entire procedure.  Thurmon Fair, MD  11/11/2017, 14:25  Raymond Gurney., MD, [place] [facility]  Associate Program Director, Urology  Associate Professor of Urology  [facility] Department of Urology

## 2017-11-11 NOTE — H&P (Signed)
MICU H&P     Name: Arias, Andrew, 62 y.o. male Date of Service:  11/11/2017   Date of Birth:  [birth year] Date of Admission:  11/11/2017   PCP: No Pcp Length of Stay:  LOS: 0 days    MRN: [id number] Attending: Zachery Conch, MD   Code Status: Full Code Admitted for: Septic shock (CMS HCC)     HPI:   Information obtained from: patient and history reviewed via medical record    Andrew Arias is a 62 y.o. male with CAD s/p CABG, CHF, DM2, A-fib s/p ablation on warfarin, paced with AICD, CKD III, and BPH who presents with septic shock likely from a UTI/cystitis.  Pt is oriented but lethargic and a poor historian, so the majority of hx is obtained from records review as pt was transferred from [place] presented to [facility] yesterday d/t AMS, fatigue, and anorexia with decreased PO intake for 2-3 days prior.  Pt cannot recall events of going to the hospital or transfer here.  Denies hx of recent dysuria or polyuria, F/C.  ROS notable only for SOB, but pt states this is unchanged from baseline.  Appeared to have urinary retention with 300 cc on bladder scan and straight cath was difficult to pass, producing minimal prurulent fluid.  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  CT head for AMS was unremarkable.  Pt received one dose each of vancomycin and zosyn at the OSH and 2L fluid bolus.  He began having further confusion and was hypotensive requiring levophed support.  ECG at OSH also notable for possible pacemaker failure, but the pt had no cardiac symptoms.  He was transferred on 100 ml/hr mIVF but no total given was reported; central line placed prior to transfer.        ROS    Other than ROS in the HPI, all other systems were negative.    Medical History:    Past Medical \ Surgical History:   Past Medical History:   Diagnosis Date   . A-fib (CMS HCC)     s/p ablation   . Anemia    . Cardiomyopathy (CMS [place])    . CHF (congestive heart  failure) (CMS HCC)    . CKD (chronic kidney disease), stage III (CMS HCC)    . Colonic polyp    . Constipation    . DM2 (diabetes mellitus, type 2) (CMS HCC)    . Gout    . History of cellulitis    . HLD (hyperlipidemia)    . HTN (hypertension)    . Pacemaker     Past Surgical History:   Procedure Laterality Date   . HX CORONARY ARTERY BYPASS GRAFT        Home Medications:  Outpatient Medications Marked as Taking for the 11/11/17 encounter [facility] Encounter)   Medication Sig   . allopurinol (ZYLOPRIM) 300 mg Oral Tablet Take 300 mg by mouth Once a day   . aspirin 81 mg Oral Tablet, Chewable Take 324 mg by mouth Once a day   . bumetanide (BUMEX) 2 mg Oral Tablet Take 2 mg by mouth Twice daily   . carvedilol (COREG) 12.5 mg Oral Tablet Take 12.5 mg by mouth Twice daily with food   . colchicine 0.6 mg Oral Tablet Take 0.6 mg by mouth Every Monday, Wednesday and Friday   . cyanocobalamin (VITAMIN B12) 1,000 mcg/mL Injection Solution 1,000 mcg by Subcutaneous route  Once a day   . digoxin (LANOXIN) 125 mcg Oral Tablet Take 0.125 mg by mouth Once a day   . gabapentin (NEURONTIN) 300 mg Oral Capsule Take 300 mg by mouth Every morning   . gabapentin (NEURONTIN) 300 mg Oral Capsule Take 600 mg by mouth Every evening   . insulin lispro (HUMALOG) 100 unit/mL Subcutaneous Solution by Subcutaneous route Three times daily before meals   . Leflunomide (ARAVA) 20 mg Oral Tablet Take 20 mg by mouth Once a day   . magnesium oxide (MAG-OX) 400 mg (241.3 mg magnesium) Oral Tablet Take 400 mg by mouth Once a day   . metFORMIN (GLUCOPHAGE) 500 mg Oral Tablet Take 500 mg by mouth Twice daily with food   . pioglitazone (ACTOS) 15 mg Oral Tablet Take 15 mg by mouth Once a day   . simvastatin (ZOCOR) 20 mg Oral Tablet Take 20 mg by mouth Every evening   . warfarin (COUMADIN) 1 mg Oral Tablet Take 1 mg by mouth Every evening      Allergies:  He has No Known Allergies.      Family History:  His family history includes Coronary Artery Disease  in his father.   Social History:  He  reports that he drinks alcohol. He  reports that he quit smoking about 7 years ago. He does not have any smokeless tobacco history on file. He  reports that he does not use drugs.       Objective:     VITAL SIGNS:  Temperature: 36.6 C (97.9 F) BP (Non-Invasive): (!) 83/58 Heart Rate: 72   Respiratory Rate: 18 SpO2-1: 99 % Weight: (!) 162.5 kg (358 lb 4 oz)        INTAKE & OUTPUT:    Intake/Output Summary (Last 24 hours) at 11/11/2017 1314  Last data filed at 11/11/2017 1300  Gross per 24 hour   Intake 1090.95 ml   Output 300 ml   Net 790.95 ml           VENTILATOR SETTINGS:  Not on Ventilator     WEANING PARAMETERS:        PHYSICAL EXAMINATION:  General: NAD, appears chronically ill  HENT: NC/AT; oral mucous membranes dry  Eyes: conjunctivae clear, non-icteric; PERRLA, L lateral upper lid lesion  Neck: trachea midline  Cardiovascular: RRR; normal N9/G9; holosystolic murmur  Pulmonary / Chest: Normal respiratory effort; CTAB but mild crackles at bilateral bases.  NC in place  GI / Abdominal: Morbidly obese, soft, non-distended; BS normal; no TTP  Extremities: bilateral pitting edema; no cyanosis; well-perfused  Skin: Warm and dry, venous stasis dermatitis changes over bilateral legs with hyperpigmentation and some superficial fluid blisters, R>L  Genitourinary: mild suprapubic tenderness  Neurologic: CN grossly intact.  Oriented but lethargic with slowed mentation and speech  [place]: Eye opening: 4 spontaneous, Verbal resonse:  5 oriented, Best motor response:  6 obeys commands     LINES / TUBES / DRAINS:   Patient Lines/Drains/Airways Status    Active Line / Dialysis Catheter / Dialysis Graft / Drain / Airway / Wound     Name: Placement date: Placement time: Site: Days:    Peripheral IV Right Dorsal Metacarpals  (top of hand)  -   -       Peripheral IV Right Dorsal Metacarpals  (top of hand)  -   -       Central Triple Lumen  11/11/17   -   less than 1  Wound (Non-Surgical)  Lower;Left;Right Leg  11/11/17   0748   less than 1    Wound (Non-Surgical) Right Ankle  11/11/17   0730   less than 1                   Labs:     I have reviewed all labs    CBC Differential   Recent Labs     11/11/17  0757   WBC 9.7   HGB 12.3*   HCT 36.8   PLTCNT 108*    Recent Labs     11/11/17  0757   PMNS 92   LYMPHOCYTES 3   MONOCYTES 5   EOSINOPHIL 0   BASOPHILS 1  0.05   PMNABS 8.87*   LYMPHSABS 0.28*   MONOSABS 0.49   EOSABS 0.00      BMP LFTs   Recent Labs     11/11/17  0757   SODIUM 137   POTASSIUM 4.7   CHLORIDE 102   CO2 23   BUN 88*   CREATININE 3.71*   GLUCOSENF 212*   ANIONGAP 12   BUNCRRATIO 24*   GFR 17*   CALCIUM 8.1*   MAGNESIUM 2.0   PHOSPHORUS 3.3   ALBUMIN 2.6*     Recent Labs     11/11/17  0757   CALCIUM 8.1*   ALBUMIN 2.6*   MAGNESIUM 2.0   PHOSPHORUS 3.3    Recent Labs     11/11/17  0757 11/11/17  0923   TOTALPROTEIN 6.3  --    ALBUMIN 2.6*  --    TOTBILIRUBIN 3.0*  --    BILIRUBINCON 2.1*  --    UROBILINOGEN  --  Negative   AST 35  --    ALT 10  --    ALKPHOS 93  --       CoAgs Blood Gas:   Recent Labs     11/11/17  0756   PROTHROMTME 25.7*   INR 2.23*   APTT 82.0*   FIBRINOGEN 550*    Recent Labs     11/11/17  0757   FI02 32.0   PH 7.36   PCO2 44.00   PO2 37.0   BICARBONATE 23.5   BASEDEFICIT 0.8       Cardiac Markers Lipid Panel   Recent Labs     11/11/17  0757   TROPONINI 78*   BNP 663*    No results found for this encounter   Urine Analysis Other Labs   Recent Labs     11/11/17  0923   APPEARANCE Clear   COLOR Normal (Yellow)   SPECGRAVUR 1.015   GLUCOSE Negative   BILIRUBIN Negative   KETONES Negative   PHURINE 6.0   PROTEIN 30 *   UROBILINOGEN Negative   NITRITE Negative   LEUKOCYTES Moderate*   RBCS 3.0   WBCS 22.0*   BACTERIA Occasional or less    Recent Labs     11/11/17  0757   CREAPROINFLA 262.4*          Inpatient Medications:        Current Facility-Administered Medications:  ceFEPime (MAXIPIME) 2 g in iso-osmotic 100 mL premix IVPB 2 g Intravenous Once   [START ON  11/12/2017] ceFEPime (MAXIPIME) 2 g in iso-osmotic 100 mL premix IVPB 2 g Intravenous Q24H   docusate sodium (COLACE) capsule 100 mg Oral 2x/day   linezolid (ZYVOX) tablet 600 mg Oral 2x/day   norepinephrine (LEVOPHED) 16 mg in  NS 230mL premix infusion 0.03 mcg/kg/min Intravenous Continuous   NS flush syringe 2 mL Intracatheter Q8HRS   And      NS flush syringe 2-6 mL Intracatheter Q1 MIN PRN   NS flush syringe 2 mL Intracatheter Q8HRS   And      NS flush syringe 2-6 mL Intracatheter Q1 MIN PRN   perflutren lipid microspheres (DEFINITY) 1.3 mL in NS 10 mL (tot vol) injection 2 mL Intravenous Give in Cardiology   sennosides-docusate sodium (SENOKOT-S) 8.6-50mg  per tablet 1 Tab Oral 2x/day   SSIP insulin lispro (HUMALOG) 100 units/mL injection 3-9 Units Subcutaneous 4x/day PRN   warfarin (COUMADIN) tablet 5 mg Oral Daily       Drips:  Current Facility-Administered Medications   Medication Dose Frequency Last Rate   . norepinephrine (LEVOPHED) 16 mg in NS 261mL premix infusion  0.03 mcg/kg/min Continuous 0.08 mcg/kg/min (11/11/17 0800)       Imaging / Studies:    I have reviewed all imaging studies    Microbiology:    - blood Cx 3/17: pending  - urine Cx 3/17: pending  - MRSA screen: pending  - C diff negative    Antimicrobials Start date End date   1. linezolid 3/17    2. cefepime 3/17    3.      4.      5.          Medical Decision Making:     Assessment / Plan: Yer Olivencia is a 62 y.o. male here for Septic shock (CMS [place])    Septic shock 2/2 probable UTI  hypotension  bladder lesion, possible diverticulum:   Status/DDx: Stable, hemodynamics improving with fluid resuscitation and levophed.  S/p 2L at OSH  Workup:   - blood, urine Cx, CXR  - urology consulted, appreciate recs  - CT from [facility] uploaded  Tx:   - linezolid, cefepime; narrow pending Cx  - 1L plasmalyte  - tele     CHF, subtype undetermined  A-fib on warfarin  concern for pacemaker failure  elevated troponin:   Status/DDx: mild crackles on exam  but appears intravascularly depleted 2/2 sepsis.  No cardiac symptoms, no baseline O2 requirement  Workup:   - repeat ECG with possible pacemaker failure  - cardiology EPS consulted  - TTE  - BNP 663  - trop 71; continue trend  Tx:   - tele  - monitor INR; per pt pharmacy, takes 5 mg 1-2 tabs daily  - warfarin 5 mg qd, titrate prn  - digoxin level; hold med pending result    AKI on CKD III:  - likely 2/2 septic shock; per report, baseline Cr ~1.6; was 3.6 at OSH  - urine lytes  - strict I/Os    DM2:  - hold home PO agents  - SSI; titrate as needed  - holding gabapentin given AMS      Chronic medical problems:  - CAD s/p CABG  HTN  HLD: hold coreg, bumex, entresto (non-formulary), statin  - gout: hold allopurinol, colchicine  - BPH:   - Hypomagnesemia: hold PO supplement, replace prn  - leflunamide; unclear indication, will hold    ____________________________________________________________________    Prophylaxis:   - DVT: Warfarin   - GI: Not indicated  Analgesia: None  Nutrition: NPO  Therapy: OT and PT  Sedation: none  Bowel regimen: senna, colace    Consults: cardiology and urology      Virl Cagey. Caron Presume, MD  Transitional Year PGY-1  Pager: [phone]      ------------------------------------------------------------------------------------------------------------------------------------------------  Overnight events noted. Patient was examined during morning rounds on 11/11/17.     Likely septic shock from urosepsis and hypovolemia from poor PO intake. Uretheral stricture s/p dilation and placement of foley by Urology. CAD/ CHF s/p AICD. EPS/ AICD interrogation. Broad spectrum abx for now. Consult wound for LE lymphedema/bleb. Will need aggressive diuresis once stable hemodynamics. Trend renal function/lytes. Prognosis guarded.     Prophylaxis, nutrition and devices: as noted above    I saw and examined the patient, personally performed the critical or key portions of the service and discussed patient management with  the ICU team including the resident, fellow, nurse and ancillary staff. I reviewed the resident's note and agree with the history, findings and plan of care. Exceptions are edited/noted above. Laboratory, hemodynamic, respiratory support, and radiological data were also reviewed and discussed.     Critical Care time: 63 mins    Lovell Nuttall Jacquelyne Balint, MD  Section of Pulmonary, Critical Care and Sleep Medicine  Department of Medicine, [facility]  11/11/2017, 14:06

## 2017-11-11 NOTE — Consults (Addendum)
[facility]  INITIAL CONSULT NOTE    Patient: Andrew Arias, Andrew Arias, 62 y.o. male  MRN: [id number]  Date of Admission:  11/11/2017  Date of Service:  11/11/2017  Date of Birth:  18-Dec-1955  PCP: No Pcp.  Information obtained from: Patient, health care provider, history reviewed via medical record  Consult requested by: MICU    Consult for: foley placement, bladder diverticulum      HPI:    Andrew Arias is a 62 y.o. male with a history of DM, CAD s/p CABG, CHF, CKD who presents to our facility as a transfer from [place] with concern for septic shock believed to be due to UTI. He initially presented to outside hospital yesterday with a 2 day history of AMS and fatigue. There, a UA was indicative of infection and he was found to be in urinary retention with 300 cc on bladder scan. There, they had trouble passing a catheter. 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 At their facility he became hypotensive; although he initially responded to fluid resuscitation, he ultimately required pressors and was transferred to our MICU. On initial consultation, he is in no distress. Denies any pain or fevers/chills. He does not remember any information prior to admission. Creatinine elevated at 3.71 on arrival however I am unsure of his baseline.     ROS:     ROS Other than ROS in the HPI, all other systems were negative.    PAST MEDICAL/ FAMILY/ SOCIAL HISTORY:    PAST MEDICAL:    Past Medical History:   Diagnosis Date   . A-fib (CMS HCC)     s/p ablation   . Anemia    . Cardiomyopathy (CMS [place])    . CHF (congestive heart failure) (CMS HCC)    . CKD (chronic kidney disease), stage III (CMS HCC)    . Colonic polyp    . Constipation    . DM2 (diabetes mellitus, type 2) (CMS HCC)    . Gout    . History of cellulitis    . HLD (hyperlipidemia)    . HTN (hypertension)    . Pacemaker         Past  Surgical History:   Procedure Laterality Date   . HX CORONARY ARTERY BYPASS GRAFT              Medications Prior to Admission     Prescriptions    allopurinol (ZYLOPRIM) 300 mg Oral Tablet    Take 300 mg by mouth Once a day    aspirin 81 mg Oral Tablet, Chewable    Take 324 mg by mouth Once a day    bumetanide (BUMEX) 2 mg Oral Tablet    Take 2 mg by mouth Twice daily    carvedilol (COREG) 12.5 mg Oral Tablet    Take 12.5 mg by mouth Twice daily with food    colchicine 0.6 mg Oral Tablet    Take 0.6 mg by mouth Every Monday, Wednesday and Friday    cyanocobalamin (VITAMIN B12) 1,000 mcg/mL Injection Solution    1,000 mcg by Subcutaneous route Once a day    digoxin (LANOXIN) 125 mcg Oral Tablet    Take 0.125 mg by mouth Once a day    gabapentin (NEURONTIN) 300 mg Oral Capsule    Take 300 mg by mouth Every morning    gabapentin (NEURONTIN)  300 mg Oral Capsule    Take 600 mg by mouth Every evening    insulin lispro (HUMALOG) 100 unit/mL Subcutaneous Solution    by Subcutaneous route Three times daily before meals    Leflunomide (ARAVA) 20 mg Oral Tablet    Take 20 mg by mouth Once a day    magnesium oxide (MAG-OX) 400 mg (241.3 mg magnesium) Oral Tablet    Take 400 mg by mouth Once a day    metFORMIN (GLUCOPHAGE) 500 mg Oral Tablet    Take 500 mg by mouth Twice daily with food    pioglitazone (ACTOS) 15 mg Oral Tablet    Take 15 mg by mouth Once a day    simvastatin (ZOCOR) 20 mg Oral Tablet    Take 20 mg by mouth Every evening    warfarin (COUMADIN) 1 mg Oral Tablet    Take 1 mg by mouth Every evening        No Known Allergies    Family History  Family Medical History:     Problem Relation (Age of Onset)    Coronary Artery Disease Father          Social History  Social History     Socioeconomic History   . Marital status: Married     Spouse name: Not on file   . Number of children: Not on file   . Years of education: Not on file   . Highest education level: Not on file   Occupational History   . Not on file   Social  Needs   . Financial resource strain: Not on file   . Food insecurity:     Worry: Not on file     Inability: Not on file   . Transportation needs:     Medical: Not on file     Non-medical: Not on file   Tobacco Use   . Smoking status: Former Smoker     Last attempt to quit: 2012     Years since quitting: 7.2   Substance and Sexual Activity   . Alcohol use: Yes     Frequency: 2-4 times a month   . Drug use: Never   . Sexual activity: Not on file   Lifestyle   . Physical activity:     Days per week: Not on file     Minutes per session: Not on file   . Stress: Not on file   Relationships   . Social connections:     Talks on phone: Not on file     Gets together: Not on file     Attends religious service: Not on file     Active member of club or organization: Not on file     Attends meetings of clubs or organizations: Not on file     Relationship status: Not on file   . Intimate partner violence:     Fear of current or ex partner: Not on file     Emotionally abused: Not on file     Physically abused: Not on file     Forced sexual activity: Not on file   Other Topics Concern   . Not on file   Social History Narrative   . Not on file        PHYSICAL EXAMINATION:    Temperature: 36.6 C (97.9 F)  Heart Rate: 72  BP (Non-Invasive): (!) 83/58  Respiratory Rate: 18  SpO2-1: 99 %  Constitutional (e.g. vital signs, general  appearance) - 62 y.o. male, NAD  Eyes: Conjunctiva clear, sclera white   Ears, nose, mouth, and throat -  Trachea midline. Patient has two ears. Nose is midline, nostrils appear patent. Moist mucous membranes.    Cardiovascular - Pulse palpable.  Respiratory - Unlabored respiratory effort  Abdomen - Soft, non-distended, non-tender  Musculoskeletal - All four extremities present  Skin - Warm and dry  Neurological - CN II-XII grossly intact, A&O x 3  Psychiatric - Normal affect  Hematologic/lymphatic/immunologic - No petechiae.  Genitourinary - circumcised penis without masses or lesions. Attempted to pass 16 Fr  catheter however he has a fossa navicularis stricture. Bilateral descended testes without masses or lesions      Labs Ordered/ Reviewed   Reviewed: Labs:  Lab Results for Last 24 Hours:   Results for orders placed or performed during the hospital encounter of 11/11/17 (from the past 24 hour(s))   PTT (PARTIAL THROMBOPLASTIN TIME)   Result Value Ref Range    APTT 82.0 (H) 24.1 - 38.5 seconds   PT/INR   Result Value Ref Range    PROTHROMBIN TIME 25.7 (H) 9.5 - 14.1 seconds    INR 2.23 (H) 0.80 - 1.20   FIBRINOGEN   Result Value Ref Range    FIBRINOGEN 550 (H) 200 - 400 mg/dL   BASIC METABOLIC PANEL   Result Value Ref Range    SODIUM 137 136 - 145 mmol/L    POTASSIUM 4.7 3.5 - 5.1 mmol/L    CHLORIDE 102 96 - 111 mmol/L    CO2 TOTAL 23 22 - 32 mmol/L    ANION GAP 12 4 - 13 mmol/L    CALCIUM 8.1 (L) 8.5 - 10.2 mg/dL    GLUCOSE 212 (H) 65 - 139 mg/dL    BUN 88 (H) 8 - 25 mg/dL    CREATININE 3.71 (H) 0.62 - 1.27 mg/dL    BUN/CREA RATIO 24 (H) 6 - 22    ESTIMATED GFR 17 (L) >59 mL/min/1.7m^2   MAGNESIUM   Result Value Ref Range    MAGNESIUM 2.0 1.6 - 2.5 mg/dL   PHOSPHORUS   Result Value Ref Range    PHOSPHORUS 3.3 2.3 - 4.0 mg/dL   HEPATIC FUNCTION PANEL   Result Value Ref Range    ALBUMIN 2.6 (L) 3.4 - 4.8 g/dL    ALKALINE PHOSPHATASE 93 <150 U/L    ALT (SGPT) 10 <55 U/L    AST (SGOT) 35 8 - 48 U/L    BILIRUBIN TOTAL 3.0 (H) 0.3 - 1.3 mg/dL    BILIRUBIN DIRECT 2.1 (H) <0.3 mg/dL    PROTEIN TOTAL 6.3 6.0 - 8.0 g/dL   PROCALCITONIN   Result Value Ref Range    PROCALCITONIN SERUM 15.54 (H) <0.50 ng/mL   C-REACTIVE PROTEIN(CRP),INFLAMMATION   Result Value Ref Range    CRP INFLAMMATION 262.4 (H) <=8.0 mg/L   B-TYPE NATRIURETIC PEPTIDE   Result Value Ref Range    BNP 663 (H) <=100 pg/mL   TROPONIN-I   Result Value Ref Range    TROPONIN I 78 (H) 0 - 30 ng/L   VENOUS BLOOD GAS/LACTATE   Result Value Ref Range    %FIO2 (VENOUS) 32.0 %    PH (VENOUS) 7.36 7.31 - 7.41    PCO2 (VENOUS) 44.00 41.00 - 51.00 mm/Hg    PO2 (VENOUS) 37.0  35.0 - 50.0 mm/Hg    BASE DEFICIT 0.8 -3.0 - 3.0 mmol/L    BICARBONATE (VENOUS) 23.5 22.0 - 26.0 mmol/L  LACTATE 1.8 (H) 0.0 - 1.3 mmol/L   TYPE AND SCREEN   Result Value Ref Range    UNITS ORDERED NOT STATED         ABO/RH(D) A NEGATIVE     ANTIBODY SCREEN NEGATIVE     SPECIMEN EXPIRATION DATE 11/14/2017    CBC WITH DIFF   Result Value Ref Range    WBC 9.7 3.5 - 11.0 x10^3/uL    RBC 3.83 (L) 4.06 - 5.63 x10^6/uL    HGB 12.3 (L) 12.5 - 16.3 g/dL    HCT 36.8 36.7 - 47.0 %    MCV 96.2 78.0 - 100.0 fL    MCH 32.0 27.4 - 33.0 pg    MCHC 33.3 32.5 - 35.8 g/dL    RDW 19.5 (H) 12.0 - 15.0 %    PLATELETS 108 (L) 140 - 450 x10^3/uL    MPV 9.1 7.5 - 11.5 fL    NEUTROPHIL % 92 %    LYMPHOCYTE % 3 %    MONOCYTE % 5 %    EOSINOPHIL % 0 %    BASOPHIL % 1 %    NEUTROPHIL # 8.87 (H) 1.50 - 7.70 x10^3/uL    LYMPHOCYTE # 0.28 (L) 1.00 - 4.80 x10^3/uL    MONOCYTE # 0.49 0.30 - 1.00 x10^3/uL    EOSINOPHIL # 0.00 0.00 - 0.50 x10^3/uL    BASOPHIL # 0.05 0.00 - 0.20 x10^3/uL   POC BLOOD GLUCOSE (RESULTS)   Result Value Ref Range    GLUCOSE, POC 199 (H) 70 - 105 mg/dl   URINALYSIS, MACROSCOPIC   Result Value Ref Range    SPECIFIC GRAVITY 1.015 1.005 - 1.030    GLUCOSE Negative Negative mg/dL    PROTEIN 30  (A) Negative mg/dL    BILIRUBIN Negative Negative mg/dL    UROBILINOGEN Negative Negative mg/dL    PH 6.0 5.0 - 8.0    BLOOD Moderate (A) Negative mg/dL    KETONES Negative Negative mg/dL    NITRITE Negative Negative    LEUKOCYTES Moderate (A) Negative WBCs/uL    APPEARANCE Clear Clear    COLOR Normal (Yellow) Normal (Yellow)   URINALYSIS, MICROSCOPIC   Result Value Ref Range    WBCS 22.0 (H) <4.0 /hpf    RBCS 3.0 <6.0 /hpf    BACTERIA Occasional or less Occasional or less /hpf    HYALINE CASTS 4.0 (H) <4.0 /lpf    SQUAMOUS EPITHELIAL CELLS Occasional or less Occasional or less /lpf   CLOSTRIDIUM DIFFICILE TOXIN DETECTION   Result Value Ref Range    CLOSTRIDIUM DIFFICILE TOXIN DETECTION Negative Negative, Indeterminate        Radiology  Tests Ordered/ Reviewed          Impression:   62 y.o. male with:    1. Septic shock (likely urosepsis)  2. Urinary retention  3. 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 I suspect that this contributed to his urinary retention which then put him at risk for UTI/urosepsis  -recommend maintaining foley catheter during this admission and likely on discharge   -continue broad spectrum antibiotics, de-escalate per cultures  -continue to trend renal parameters  -Please call/page [facility] Urology with any questions, concerns, or changes in the interim     Lavon Paganini. Christoper Fabian,  MD  Resident, PGY-2  [facility] Department of Urology  Pager [phone]  11/11/2017, 12:44    I saw and examined the patient with resident.  I reviewed the resident's note.  I agree with the findings and plan of care as documented in the resident's note.  Any exceptions/additions are edited/noted.    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    Lavon Paganini. Christoper Fabian, MD  Resident, PGY-2  [facility] Department of Urology  Pager [phone]  11/11/2017, 20:19      On my review of CT renal calculi series and CT cystogram, I do not appreciate any stones, hydronephrosis, urachal remnant, or bladder diverticulum    Maintain foley catheter on discharge    We will have patient come back in 3 weeks for cystoscopy and urodynamic studies (schedulers have been notified).      Lavon Paganini. Christoper Fabian, MD  Resident, PGY-2  [facility] Department of Urology  Pager  [phone]  11/12/2017, 04:51

## 2017-11-11 NOTE — Ancillary Notes (Cosign Needed)
ICU INTAKE    62 year old male admitted to [facility] with lethargy, decreased urine output, poor PO intake.  Was hypotensive in ED.  Given 2L NS with improvement in BP.  Concern was for UTI.  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  Patient apparently incontinent with purulent appearing urine.      He was admitted to [facility] to await bed at [place] for medicine admit with urology consult after outside provider spoke with Dr. Matthew Folks.  Was started on maintenance @ 100cc/hr.  Given Vancomycin and Zosyn.  Overnight, BP trended down.  Given another liter bolus.  At time of call, central line was being placed to start levophed.  Last BP 64/46.      Patient accepted to MICU 2 for UTI and septic shock.      Chronic medical conditions include CHF, DM, A fib (on warfarin) morbid obesity, CKD (baseline Cr around 1.6).  Current Cr 3.6.        Valere Dross, MD

## 2017-11-12 ENCOUNTER — Inpatient Hospital Stay (HOSPITAL_COMMUNITY): Payer: No Typology Code available for payment source

## 2017-11-12 DIAGNOSIS — L989 Disorder of the skin and subcutaneous tissue, unspecified: Secondary | ICD-10-CM

## 2017-11-12 DIAGNOSIS — I509 Heart failure, unspecified: Secondary | ICD-10-CM

## 2017-11-12 DIAGNOSIS — I361 Nonrheumatic tricuspid (valve) insufficiency: Secondary | ICD-10-CM

## 2017-11-12 DIAGNOSIS — S91001A Unspecified open wound, right ankle, initial encounter: Secondary | ICD-10-CM

## 2017-11-12 DIAGNOSIS — E1122 Type 2 diabetes mellitus with diabetic chronic kidney disease: Secondary | ICD-10-CM

## 2017-11-12 DIAGNOSIS — E11628 Type 2 diabetes mellitus with other skin complications: Secondary | ICD-10-CM

## 2017-11-12 DIAGNOSIS — I340 Nonrheumatic mitral (valve) insufficiency: Secondary | ICD-10-CM

## 2017-11-12 DIAGNOSIS — I959 Hypotension, unspecified: Secondary | ICD-10-CM

## 2017-11-12 LAB — TROPONIN-I
TROPONIN I: 23 ng/L (ref 0–30)
TROPONIN I: 26 ng/L (ref 0–30)

## 2017-11-12 LAB — CBC
HCT: 36 % — ABNORMAL LOW (ref 36.7–47.0)
HGB: 12 g/dL — ABNORMAL LOW (ref 12.5–16.3)
MCH: 31.8 pg (ref 27.4–33.0)
MCHC: 33.2 g/dL (ref 32.5–35.8)
MCV: 95.8 fL (ref 78.0–100.0)
MPV: 9.3 fL (ref 7.5–11.5)
PLATELETS: 107 10*3/uL — ABNORMAL LOW (ref 140–450)
RBC: 3.76 10*6/uL — ABNORMAL LOW (ref 4.06–5.63)
RDW: 19.3 % — ABNORMAL HIGH (ref 12.0–15.0)
WBC: 9.1 10*3/uL (ref 3.5–11.0)

## 2017-11-12 LAB — ECG 12-LEAD
Atrial Rate: 64 {beats}/min
Calculated R Axis: -103 degrees
Calculated T Axis: 60 degrees
QRS Duration: 186 ms
QT Interval: 490 ms

## 2017-11-12 LAB — BASIC METABOLIC PANEL
ANION GAP: 11 mmol/L (ref 4–13)
BUN: 78 mg/dL — ABNORMAL HIGH (ref 8–25)
CALCIUM: 8.2 mg/dL — ABNORMAL LOW (ref 8.5–10.2)
CHLORIDE: 101 mmol/L (ref 96–111)
CO2 TOTAL: 24 mmol/L (ref 22–32)
CREATININE: 2.56 mg/dL — ABNORMAL HIGH (ref 0.62–1.27)
ESTIMATED GFR: 26 mL/min/{1.73_m2} — ABNORMAL LOW (ref >59)
GLUCOSE: 171 mg/dL — ABNORMAL HIGH (ref 65–139)
POTASSIUM: 4 mmol/L (ref 3.5–5.1)
SODIUM: 136 mmol/L (ref 136–145)

## 2017-11-12 LAB — PHOSPHORUS: PHOSPHORUS: 3 mg/dL (ref 2.3–4.0)

## 2017-11-12 LAB — INFLUENZA VIRUS TYPE A AND TYPE B, AND RESPIRATORY SYNCYTIAL VIRUS (RSV), PCR: INFLUENZA VIRUS TYPE A: NOT DETECTED

## 2017-11-12 LAB — POC BLOOD GLUCOSE (RESULTS)
GLUCOSE, POC: 178 mg/dl — ABNORMAL HIGH (ref 70–105)
GLUCOSE, POC: 132 mg/dl — ABNORMAL HIGH (ref 70–105)

## 2017-11-12 LAB — PT/INR: INR: 2.96 — ABNORMAL HIGH (ref 0.80–1.20)

## 2017-11-12 LAB — URINE CULTURE: URINE CULTURE: NO GROWTH

## 2017-11-12 LAB — HGA1C (HEMOGLOBIN A1C WITH EST AVG GLUCOSE): HEMOGLOBIN A1C: 8.2 % — ABNORMAL HIGH (ref 4.0–6.0)

## 2017-11-12 MED ORDER — CEFEPIME 2 GRAM/100 ML IN DEXTROSE PREMIX IVPB - EXTENDED INFUSION
2.00 g | INJECTION | Freq: Two times a day (BID) | INTRAVENOUS | Status: DC
Start: 2017-11-12 — End: 2017-11-13
  Administered 2017-11-12 (×2): 2 g via INTRAVENOUS
  Administered 2017-11-12: 0 g via INTRAVENOUS
  Administered 2017-11-13: 2 g via INTRAVENOUS
  Administered 2017-11-13: 0 g via INTRAVENOUS
  Filled 2017-11-12 (×3): qty 100

## 2017-11-12 MED ORDER — HEPARIN (PORCINE) 25,000 UNIT/250 ML (100 UNIT/ML) IN DEXTROSE 5 % IV
12.0000 [IU]/kg/h | INTRAVENOUS | Status: DC
Start: 2017-11-12 — End: 2017-11-12

## 2017-11-12 MED ORDER — NOREPINEPHRINE BITARTRATE 16 MG/250 ML (64 MCG/ML) IN 0.9 % NACL IV
0.0300 ug/kg/min | INTRAVENOUS | Status: DC
Start: 2017-11-12 — End: 2017-11-15
  Administered 2017-11-12 (×4): 0.03 ug/kg/min via INTRAVENOUS
  Administered 2017-11-13: 0.01 ug/kg/min via INTRAVENOUS
  Administered 2017-11-13 (×2): 0.03 ug/kg/min via INTRAVENOUS
  Administered 2017-11-13: 0 ug/kg/min via INTRAVENOUS
  Administered 2017-11-13 (×2): 0.03 ug/kg/min via INTRAVENOUS
  Administered 2017-11-13: 0.05 ug/kg/min via INTRAVENOUS
  Administered 2017-11-13: 0.07 ug/kg/min via INTRAVENOUS
  Administered 2017-11-13 (×3): 0.03 ug/kg/min via INTRAVENOUS
  Administered 2017-11-13: 0 ug/kg/min via INTRAVENOUS
  Administered 2017-11-13 – 2017-11-14 (×4): 0.03 ug/kg/min via INTRAVENOUS
  Administered 2017-11-14: 0.01 ug/kg/min via INTRAVENOUS
  Administered 2017-11-14: 0 ug/kg/min via INTRAVENOUS
  Administered 2017-11-14: 0.03 ug/kg/min via INTRAVENOUS
  Administered 2017-11-14: 0.01 ug/kg/min via INTRAVENOUS
  Administered 2017-11-14 (×2): 0.05 ug/kg/min via INTRAVENOUS
  Administered 2017-11-14: 0.01 ug/kg/min via INTRAVENOUS
  Administered 2017-11-14: 0 ug/kg/min via INTRAVENOUS
  Filled 2017-11-12 (×2): qty 250

## 2017-11-12 MED ORDER — SIMVASTATIN 20 MG TABLET
20.0000 mg | ORAL_TABLET | Freq: Every evening | ORAL | Status: DC
Start: 2017-11-12 — End: 2017-11-16
  Administered 2017-11-12 – 2017-11-15 (×4): 20 mg via ORAL
  Filled 2017-11-12 (×5): qty 1

## 2017-11-12 MED ORDER — ALBUMIN, HUMAN 5 % INTRAVENOUS SOLUTION
12.5000 g | Freq: Once | INTRAVENOUS | Status: AC
Start: 2017-11-12 — End: 2017-11-12
  Administered 2017-11-12: 12.5 g via INTRAVENOUS
  Administered 2017-11-12: 0 g via INTRAVENOUS

## 2017-11-12 MED ORDER — INSULIN GLARGINE (U-100) 100 UNIT/ML SUBCUTANEOUS SOLUTION
10.0000 [IU] | Freq: Every evening | SUBCUTANEOUS | Status: DC
Start: 2017-11-12 — End: 2017-11-14
  Administered 2017-11-12 – 2017-11-13 (×2): 10 [IU] via SUBCUTANEOUS
  Filled 2017-11-12 (×2): qty 10

## 2017-11-12 MED ORDER — ACETAMINOPHEN 325 MG TABLET
650.0000 mg | ORAL_TABLET | ORAL | Status: DC | PRN
Start: 2017-11-12 — End: 2017-11-22
  Administered 2017-11-12 – 2017-11-21 (×16): 650 mg via ORAL
  Filled 2017-11-12 (×17): qty 2

## 2017-11-12 MED ORDER — NYSTATIN 100,000 UNIT/GRAM TOPICAL POWDER
Freq: Two times a day (BID) | CUTANEOUS | Status: DC
Start: 2017-11-12 — End: 2017-11-22
  Administered 2017-11-17 – 2017-11-22 (×6): 0 via TOPICAL
  Filled 2017-11-12 (×2): qty 15

## 2017-11-12 MED ORDER — HEPARIN (PORCINE) 5,000 UNITS/ML BOLUS
60.0000 [IU]/kg | Freq: Once | INTRAMUSCULAR | Status: DC
Start: 2017-11-12 — End: 2017-11-12

## 2017-11-12 MED ORDER — WARFARIN 5 MG TABLET
5.00 mg | ORAL_TABLET | Freq: Every evening | ORAL | Status: DC
Start: 2017-11-12 — End: 2017-11-12
  Filled 2017-11-12: qty 1

## 2017-11-12 MED ADMIN — sodium chloride 0.9 % intravenous solution: ORAL | @ 08:00:00 | NDC 00338004903

## 2017-11-12 MED ADMIN — sodium chloride 0.9 % intravenous solution: ORAL | @ 08:00:00

## 2017-11-12 NOTE — Care Plan (Signed)
Discharge Plan:  Undetermined at this time  Met with patient and also spoke to patient's wife, Pamala Hurry via phone to complete initial care management assessment. Patient sitting up at bedside and answering questions appropriately but unable to stay awake. Contacted patient's wife via phone. Patient lives with his wife in a 2 story home with a 2 step entry with railing. Patient does not go upstairs to the 2nd floor. PCP: Dr. Lieutenant Diego, last visit was 2 weeks ago. Pharmacy: [place], [place]. Patient manages his own medications and they are affordable. Patient is not currently active with a home health agency but has been in the past. Patient's wife states he has used CPAP in the past, but not currently. Patient does not have a MPOA and does not wish to complete at this time. Discharge Plan: undetermined, PT/OT evals pending.     The patient will continue to be evaluated for developing discharge needs.

## 2017-11-12 NOTE — Progress Notes (Signed)
ECG comment:    The patient's ECG showed adequate bi-V capture with most likely posterolateral LV lead placement. There is no device failure as noted on unconfirmed ECG report.

## 2017-11-12 NOTE — Consults (Signed)
Andrew Arias  Vascular Surgery Consult   Note      Andrew Arias, Andrew Arias, 62 y.o. male  Date of Service: 11/12/2017  Date of Birth:  [birth year]    Hospital Day:  LOS: 1 day     REASON FOR CONSULT:  R Lower extremity wounds    CONSULTING PHYSICIAN: MICU    HPI:   Andrew Arias is a largely obese WM with h/o of tobacco abuse (quit smoking 1998), DM (A1c 8.2), peripheral neuropathy, CAD s/p CABG, CHF, CKD who presents to [facility] as a transfer from [place] with concern for septic shock believed to be due to UTI. While at [facility] wounds noted on admission and vascular surgery consulted for wound recommendations.     RLE wounds :    Onset: Patient reports that wound developed last Friday 3/15 and another wound developed >10 years ago.   Location: RLE, below the knee mid-shin and lateral malleus   Duration: Right lateral malleus wound has been consisently present since 2015. 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 Wound will not heal. He reports previous infection in ankle since 2015 and was treated at [facility]. Wounds to Right Mid shin are new since Friday.      Character: Right lateral malleus wound is scaly and about quarter size. Patient describes wound as mid right shin from swelling with fluid blisters.   Aggravating: Patient states that RLE blisters and lower extremity swelling is related to not having home Bumex for > 5 days.   Relief: denies positions or activity that aids in relief of s/s. Reports that he just wants his bumex given to reduce his swelling.    Patient denies having to wrap legs in past with compression or ever going to a wound center to have right malleus treated. He denies any known lower extremity PAD; ambulatory muscle cramping or rest pain, pulselessness of either lower extremities or motor dysfunction.     ROS   General: Denies fever, chills  Cardiac: Denies chest pain  Respiratory: Denies SOB  Gastrointestinal: Denies nausea,  vomiting, diarrhea or problems moving bowels  Musculoskeletal: Denies rest pain or claudication lower extremities. Reports new onset swelling since Friday.   Integumentary: Reports blisters on RLE and old wound right lateral Malleus since 2015.     All other systems were negative     Objective     Temperature: 36.5 C (97.7 F)  Heart Rate: 77  BP (Non-Invasive): 107/68  Respiratory Rate: (!) 25  SpO2-1: 99 %  Pain Score (Numeric, Faces): 4    Physical Exam  General:   Alert, calm, cooperative and in no acute distress.   HEENT:   Pupils equal, round, conjunctivae w/o injection, no icterus. Moist mucous membranes in oropharynx.   Cardiovascular:   + permeant bi-V pacer with rate in 70's. No JVD.    Right Left   Dorsalis pedis pulse: Palpable , + signal and Multiphasic Dorsalis pedis pulse: Palpable , + signal and Multiphasic   Posterior tibial: + signal and Multiphasic Posterior tibial: + signal     Lungs:   No wheezes. No accessory muscle use or cyanosis. On NC.  Abdomen:   Soft, largely round, non-tender and non-distended.   Skin:   Warm, dry, well-perfused;   Skin breakdown/lesion noted:   LLE :brownish discoloration/hyperpigmentation above ankles with scaly flaky skin.     RLE:   Mid-shin 7 cm L x 4.5 cm W x 0  cm D ruptured fluid filled lesion. + serious drainage.       Right Medial leg: several fluid filled lesion.      Right posterior calf: Fluid filled lesion.      Lateral Malleus: 2cm W x 2 cm L open lesion, with surrounding hard and dry plaquing skin. no drainage or foul odor.      Extremities:    No lower extremity tenderness. positive edema +4 Right; legs are asymmetric in appearance: R>L.  MAE x4   GU:   Foley and clear yellow  Neuro:   Alert and oriented to person, place, and time. Able to communicate well. Cranial nerves 2-12 grossly intact. 5/5 strength in all extremities bilaterally. Sensation intact in all extremities.   Psych:   Appropriate affect, behavior and cognition     Data Reviewed:     Labs:     BMP:     Recent Labs     11/12/17  0339   SODIUM 136   POTASSIUM 4.0   CHLORIDE 101   CO2 24   BUN 78*   CREATININE 2.56*   GLUCOSENF 171*   ANIONGAP 11   BUNCRRATIO 30*   GFR 26*   CALCIUM 8.2*     CBC Results Differential Results   Recent Labs     11/12/17  0339   WBC 9.1   HGB 12.0*   HCT 36.0*   PLTCNT 107*    Recent Results (from the past 30 hour(s))   CBC WITH DIFF    Collection Time: 11/11/17  7:57 AM   Result Value    WBC 9.7    NEUTROPHIL % 92    LYMPHOCYTE % 3    MONOCYTE % 5    EOSINOPHIL % 0    BASOPHIL % 1    BASOPHIL # 0.05        Coags:    Recent Labs     11/12/17  0339   PROTHROMTME 33.8*   INR 2.96*     Assessment  Patient is a largely obese 62 y.o WM with h/o of tobacco abuse (quit smoking 1998), DM (A1c 8.2), peripheral neuropathy, CAD s/p CABG, CHF, CKD who presents to [facility] as a transfer from [place] with concern for septic shock believed to be due to UTI. While at [facility] wounds noted on admission and vascular surgery consulted for wound recommendations.     Current Comorbid Conditions/Vascular Risk Factors:   -CAD Yes   -MI CAD Presentation - No symptoms or angina in last 14 days  -Pacemaker:Yes  -Congestive Heart Failure Yes Heart Failure Unspecified  -Dyslipidemia (total chol >200, LDL 130, HDL men <40, HDL women <50)Yes  -HTN- Yes on medications: Yes    -DM  Yes Type 2  Foot Ulcer Otherright malleus, Neuropathy  -HbA1C: 8.2  -CKD Yes Other   Stage 3    -Renal Disease:Yes . Currently on dialysis:  No  -Decubitus Ulcer:  No Ankle,   Right   Stage II (abrasion, blister, and partial thickness skin loss)     No  -Open Wound:  Yes  Yes    -Liver disease (chronic ETOH, Hep B, Hep C, cirrhosis, portal HTN, esophageal varices, congestive hepatopathy) : No  -Coagulopathy: No    -Dyspnea with rest : Yes with activity Yes  -COPDNo (chronic bronchitis, emphysema, treated with chronic inhaled meds)  -Inhaled medication or oral bronchodilator therapy at home:No  -Sleep apnea and uses  Bipap:No  -Active tobacco or nicotine use: No  -  Oxygen Dependent:No    History of or current Depression: No  Immunocompromised at present (systemic steroids, chemo, anti-rejection meds): No  Cancer within 5 years (doesn't include basal cell or squamous cell CA):No     CSHA Clinical Frailty Scale: 4- Vulnerable: Not dependent on others for daily help. Symptoms often limit activities.  Functional Health Status: Partially dependent - Requires assistance from another person for activities of daily living  Functional Disability: No    CARDIACVASULAR MEDICATIONS:     ASA: home medication, no on in hospital INR 2.9  Beta Blocker: home coreg -currently not on  ACEI/ARB: No  Statin: home meds yes, not on currently  Antiplatelets (Plavix, Brilinta, Effient): No  Anticoagulant:on warfarin at home  Spironolactone Indicated (Heart Failure): on Bumex at home, currently no on     Recommendations/Plan:   Patient has + palpable pulses and a negative H&P for lower extremity ischemia. No vascular surgical interventions are needed.     Primary team please call and order [facility] wound care team to evaluate wounds and make recommendations.    Wound orders until wound care team (Dr. Marcille Blanco) is consulted:                 -RLE mid shin and calf dressing: change daily. Remove mepilex from anterior calf. Appy xeroform to mid shin and calf daily. Cover with ABD pad, kerlix and ace compressive wrap.                -RLE lateral malleus wound: leave mepilex in place and change every 2 days. Wrap foot with kerlix and compressive ace wrap when wrapping wounds above ankle.                 - LLE: wrap with kerlix wrap and ace wrap daily.    When patient is hemodynamically stable, consider restarting home Bumex.   Please place official consult if not already done so.    Call with questions are concerns   Thanks for the referral      Teodora Medici, APRN,AGACNP-BC      I have reviewed the chart and examined the patient and I agree with the  NP as above.  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  Palpable distal pulses and acute right lower extremity venous stasis ulcer CEAP 6.  Recommend compressive wraps and elevation and antibiotic for cellulitis. follow up with [facility] as out patient and will assess for venous reflux.  Thanks    Bryna Colander, MD  11/12/2017, 17:38

## 2017-11-12 NOTE — Care Plan (Signed)
Patient out of bed to chair with sera steady, PT/OT orders implemented. Continuing antibiotics. Encouraging dietary intake, closely monitoring UO. Once levo gtt is weaned appropriately will be SDS.

## 2017-11-12 NOTE — Nurses Notes (Signed)
Patient transported to 5W with RN escort. Levo gtt infusing per MAR. Care relinquished.

## 2017-11-12 NOTE — Progress Notes (Signed)
Medical Intensive Care Unit, daily progress note  Patient: Andrew Arias, 62 y.o. male   MRN:  [id number]  Date: 11/12/2017   LOS: 1 Days    CC/Reason for ICU admission: Septic shock (CMS [place])    Subjective/Overnight events:   No acute events overnight.  Remains on a small dose of Levophed.  Denies any fevers or chills.  He feels very thirsty.    Vitals:  BP (!) 87/64   Pulse 72   Temp 36.7 C (98.1 F)   Resp (!) 37   Ht 1.93 m (6\' 4" )   Wt (!) 162.5 kg (358 lb 4 oz)   SpO2 96%   BMI 43.61 kg/m     I/O:      Intake/Output Summary (Last 24 hours) at 11/12/2017 0700  Last data filed at 11/12/2017 0700  Gross per 24 hour   Intake 2520.37 ml   Output 3575 ml   Net -1054.63 ml     Ventilator settings:   Not on a ventilator    Lines/Drains/Foley:   Peripheral IV, central triple-lumen, Foley catheter    Inpatient medications:    Reviewed.    Review of Systems - ALL NEGATIVE UNLESS BOLDED AND UNDERLINED:    Constitutional: night sweats, weight loss, fevers   Integumentary: jaundice, rash, skin lesions.   Eyes: blurred vision, eye pain, discharge, redness.   ENT: tinnitus, hearing loss, dysphagia  Cardiac: chest pain, shortness of breath, orthopnea, palpitations, syncope.   Respiratory: wheeze, dyspnea on exertion, cough, shortness of breath.  Heme/Lymph: lymphadenopathy in neck, axilla or groin.  Endocrine: polyuria, polydipsia, polyphagia.    GI: diarrhea, constipation, heartburn, nausea, vomiting.  GU: urgency, hematuria, dysuria.   Musculoskeletal: arthralgias, myalgias.  Neuro: focal weakness, loss of sensation.    Physical Exam:  Constitutional: Appears alert, well-developed and pleasant. No apparent distress. Obese.   Head/Neck:  Normocephalic and atraumatic.   Eyes: Non icteric and reactive.    Cardio: S1/S2, regular rate and rhythm. No murmurs or additional heart sounds appreciated.   Pulm: No increased work of breathing. Breathing rate normal. Clear to auscultation bilaterally. No wheezes, rales, crackles, or  rhonchi.    Extremities: erythematous right lower extremity with areas of weeping bullae.  Both lower extremities with lymphedematous changes.   Abdomen: Normal bowel sounds. Soft, nontender to palpation. Non-distended.   Skin: Skin warm and dry. No apparent lesions or rashes.   Neuro: Alert and oriented x3.    Psychiatric:  Normal affect, behavior, memory, thought content, judgement, and speech.       Labs:  BUN 70, creatinine 2.56, white count 9.1    Imaging:  CT cystogram essentially unremarkable  CT renal calculus with no obstruction, however there is perinephric stranding.    Assessment and Plan:  Patient Active Problem List   Diagnosis   . UTI (urinary tract infection)   . Morbid obesity   . Septic shock (CMS HCC)     Andrew Arias is a 62 y.o. male with history of coronary artery disease status post CABG, CHF, type 2 diabetes mellitus, AFib status post ablation, AICD, chronic kidney disease stage 3 and BPH who was admitted to the MICU for septic shock.    Septic shock secondary to urinary tract infection versus lower extremity cellulitis  - procal elevated at 15  - urology consult, appreciate recs.  - urine culture no growth today  - Remains on a small dose of Levophed, will wean as tolerated  - imaging findings suggest perinephric  stranding but no obstruction.  - continue linezolid and cefepime    Congestive heart failure  atrial fibrillation on warfarin  concern for pacemaker failure  -TTE pending BNP 663  -continue telemetry  -discontinue warfarin and hold heparin drip  -INR 2.96 continue to monitor daily  -continue digoxin, dig level normal  -once Levophed is weaned off will reintroduce Bumex.  -cardiology EP consulted for pacemaker interrogation    AKI on CKD stage III  -baseline creatinine 1.6. 3.6 at outside hospital.  -Strict I/O  -improving overall.    Lymphedema / lower extremity wounds  - wound care consulted, appreciate recs    T2DM  -blood sugars in the high 100; low 200s. Hgb A1C 8.2  -holding  home oral agents  -Sliding scale insulin; started lantus 10 units nightly     Chronic medical problems:  Neuropathy: hold gabapentin in setting of AKI  CAD s/p CABG / HTN / HLD: hold coreg, bumex, entresto. Restart statin.    Discussed with wife, who knows nothing about her husbands health or medications.    Prophylaxis:   DVT:   Holding at this time.   GI:  Not indicated  Analgesia: None  Antibiotics: Cefepime, linezolid  Nutrition: Diabetic diet  Therapy: PT/OT  Sedation: None    Andrew Albeiruti, MD 11/12/2017 08:15  [facility] of Medicine  Internal Medicine, PGY-3  Pager [phone]    ------------------------------------------------------------------------------------------------------------------------------------------------  Septic shock, unclear source. Cultures negative till date. 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 Weaning pressors. Wound consult noted. Broad spectrum abx for 48 hours, de-escalate as per the work up. AICD interrogated. Will need IV diuresis once shock status resolves. AKI improving.    I saw and examined the patient, personally performed the critical or key portions of the service and discussed patient management with the ICU team including the resident, fellow, nurse and ancillary staff. I reviewed the resident's note and agree with the history, findings and plan of care. Laboratory, hemodynamic, respiratory support, and radiological data were also reviewed and discussed. Exceptions are edited/noted above.    Critical Care time: 38 mins    Andrew Sailors Jacquelyne Balint, MD  Section of Pulmonary, Critical Care and Sleep Medicine  Department of Medicine, [facility]  11/12/2017, 15:20

## 2017-11-12 NOTE — Nurses Notes (Signed)
MICU-2 Resident notified of pt increasing ectopy and length of runs of vtach increasing. Last run of vtach was 17 beats long. Discussed pt's electrolytes, which were within normal limits. Resident stated to monitor for now, and notify of any changes or sustaining vtach. Will continue to monitor.

## 2017-11-12 NOTE — Nurses Notes (Addendum)
11/12/17 1738   Vital Signs   Heart Rate 75   Respiratory Rate (!) 7   BP (Non-Invasive) (!) 70/47   MAP (Non-Invasive) 56 mmHG       Call made to MICU 2 Resident. Nurse starting levo gtt. Per MICU, want to give albumin and hold off on restarting levophed. Awaiting albumin orders.     1800: Service text paged on orders. Awaiting reply/orders to be given.    1807: NBP 96/64 (72)  1830: NBP 75/43 (52)    MICU fellow notified. Restarting levo gtt per fellow until albumin is adminstered. Will continue to monitor.     1903: 73/52 (61) after albumin was administered. Levo gtt being ordered. Per fellow if titrating up will insert arterial line.

## 2017-11-12 NOTE — Care Management Notes (Addendum)
[place] Management Initial Evaluation    Patient Name: Andrew Arias  Date of Birth: 1956-03-05  Sex: male  Date/Time of Admission: 11/11/2017  7:20 AM  Room/Bed: 21/A  Payor: [place] / Plan: [place] [facility] / Product Type: MEDICARE [facility] /   Primary Care Providers:  Pcp, No (General)    Pharmacy Info:   Preferred Pharmacy     None        Emergency Contact Info:   Extended Emergency Contact Information  Primary Emergency Contact: Andrew Arias  Home Phone: [phone]  Relation: Wife  Secondary Emergency Contact: Andrew Arias Phone: [phone]  Relation: Other    History:   Andrew Arias is a 62 y.o., male, admitted inpatient with UTI/sepsis    Height/Weight: 193 cm ('6\' 4"' ) / (!) 162.5 kg (358 lb 4 oz)     LOS: 1 day   Admitting Diagnosis: UTI (urinary tract infection) [N39.0]    Assessment:      11/12/17 1042   Assessment Details   Assessment Type Admission   Date of Care Management Update 11/12/17   Date of Next DCP Update 11/15/17   Care Management Plan   Discharge Planning Status initial meeting   Projected Discharge Date 11/19/17   Discharge Needs Assessment   Equipment Currently Used at Home   (wife states he used a CPAP in the past but does not currently )   Equipment Needed After Discharge   (TBD)   Transportation Available family or friend will provide;car   Referral Information   Admission Type inpatient   Address Verified verified-no changes   Arrived From acute hospital, other   [place] Other - See Comments  [facility])   Insurance Verified verified-no change   ADVANCE DIRECTIVES   Does the Patient have an Advance Directive? No, Information Offered and Refused   LAY CAREGIVER    Appointed Lay Caregiver? I Decline   Employment/Financial   Financial Concerns none   Living Environment   Select an age group to open "lives with" row.  Adult   Lives With spouse   Living Arrangements house   Able to Return to Prior Arrangements yes   Home Safety   Home  Assessment: Stairs in Home  (patient states he does not go upstairs to the 2nd floor)   Home Accessibility stairs (2 railings present);stairs to enter home;bed and bath on same level   62 year old male admitted inpatient with UTI/septic shock. CXR, blood/urine cultures, CT renal calculi and CT cystogram completed. Urology consulted...urethral dilatation and foley placed at bedside. Patient on IV antibiotics (Cefepime and Zyvox).  PT/OT evals ordered.        Discharge Plan:  Undetermined at this time  Met with patient and also spoke to patient's wife, Andrew Arias via phone to complete initial care management assessment. Patient sitting up at bedside and answering questions appropriately but unable to stay awake. Contacted patient's wife via phone. Patient lives with his wife in a 2 story home with a 2 step entry with railing. Patient does not go upstairs to the 2nd floor. PCP: Dr. Nunzio Arias, last visit was 2 weeks ago. Pharmacy: [facility], [place]. Patient manages his own medications and they are affordable. Patient is not currently active with a home health agency but has been in the past. Patient's wife states he has used CPAP in the past, but not currently. Patient does not have a MPOA and does not wish to complete  at this time. Discharge Plan: undetermined, PT/OT evals pending.     **unable to locate patient's PCP in Epic Dr. Nunzio Arias [phone]      The patient will continue to be evaluated for developing discharge needs.     Case Manager: Harriette Ohara, [place] COORDINATOR  Phone: [phone]

## 2017-11-13 ENCOUNTER — Inpatient Hospital Stay (HOSPITAL_COMMUNITY): Payer: No Typology Code available for payment source

## 2017-11-13 ENCOUNTER — Encounter (HOSPITAL_COMMUNITY): Payer: Self-pay | Admitting: Family

## 2017-11-13 DIAGNOSIS — I493 Ventricular premature depolarization: Secondary | ICD-10-CM

## 2017-11-13 DIAGNOSIS — K820 Obstruction of gallbladder: Secondary | ICD-10-CM

## 2017-11-13 DIAGNOSIS — I771 Stricture of artery: Secondary | ICD-10-CM

## 2017-11-13 DIAGNOSIS — R570 Cardiogenic shock: Secondary | ICD-10-CM

## 2017-11-13 DIAGNOSIS — K8020 Calculus of gallbladder without cholecystitis without obstruction: Secondary | ICD-10-CM

## 2017-11-13 DIAGNOSIS — R160 Hepatomegaly, not elsewhere classified: Secondary | ICD-10-CM

## 2017-11-13 DIAGNOSIS — I5030 Unspecified diastolic (congestive) heart failure: Secondary | ICD-10-CM

## 2017-11-13 LAB — BASIC METABOLIC PANEL
BUN/CREA RATIO: 35 — ABNORMAL HIGH (ref 6–22)
BUN/CREA RATIO: 38 — ABNORMAL HIGH (ref 6–22)
BUN: 70 mg/dL — ABNORMAL HIGH (ref 8–25)
CALCIUM: 8.2 mg/dL — ABNORMAL LOW (ref 8.5–10.2)
CHLORIDE: 101 mmol/L (ref 96–111)
CO2 TOTAL: 25 mmol/L (ref 22–32)
CREATININE: 1.98 mg/dL — ABNORMAL HIGH (ref 0.62–1.27)
CREATININE: 1.88 mg/dL — ABNORMAL HIGH (ref 0.62–1.27)
ESTIMATED GFR: 37 mL/min/{1.73_m2} — ABNORMAL LOW (ref >59)
GLUCOSE: 135 mg/dL (ref 65–139)
GLUCOSE: 157 mg/dL — ABNORMAL HIGH (ref 65–139)
POTASSIUM: 3.8 mmol/L (ref 3.5–5.1)
POTASSIUM: 4.1 mmol/L (ref 3.5–5.1)

## 2017-11-13 LAB — CBC
HCT: 32.3 % — ABNORMAL LOW (ref 36.7–47.0)
HGB: 10.8 g/dL — ABNORMAL LOW (ref 12.5–16.3)
MCH: 31.8 pg (ref 27.4–33.0)
MCHC: 33.6 g/dL (ref 32.5–35.8)
MCV: 94.6 fL (ref 78.0–100.0)
MPV: 8.3 fL (ref 7.5–11.5)
PLATELETS: 70 10*3/uL — ABNORMAL LOW (ref 140–450)
RBC: 3.41 10*6/uL — ABNORMAL LOW (ref 4.06–5.63)
RDW: 18.4 % — ABNORMAL HIGH (ref 12.0–15.0)
WBC: 9.5 10*3/uL (ref 3.5–11.0)

## 2017-11-13 LAB — MAGNESIUM
MAGNESIUM: 2.2 mg/dL (ref 1.6–2.5)
MAGNESIUM: 2.4 mg/dL (ref 1.6–2.5)

## 2017-11-13 LAB — TROPONIN-I: TROPONIN I: 22 ng/L (ref 0–30)

## 2017-11-13 LAB — POC BLOOD GLUCOSE (RESULTS): GLUCOSE, POC: 129 mg/dl — ABNORMAL HIGH (ref 70–105)

## 2017-11-13 LAB — PHOSPHORUS
PHOSPHORUS: 2.5 mg/dL (ref 2.3–4.0)
PHOSPHORUS: 2.6 mg/dL (ref 2.3–4.0)

## 2017-11-13 LAB — PT/INR: INR: 3.78 — ABNORMAL HIGH (ref 0.80–1.20)

## 2017-11-13 MED ORDER — PREDNISONE 2.5 MG TABLET
2.50 mg | ORAL_TABLET | Freq: Every day | ORAL | Status: DC
Start: 2017-11-13 — End: 2017-11-13
  Administered 2017-11-13: 2.5 mg via ORAL
  Filled 2017-11-13 (×2): qty 1

## 2017-11-13 MED ORDER — LEFLUNOMIDE 10 MG TABLET
20.0000 mg | ORAL_TABLET | Freq: Every day | ORAL | Status: DC
Start: 2017-11-13 — End: 2017-11-13
  Administered 2017-11-13: 20 mg via ORAL
  Filled 2017-11-13 (×2): qty 2

## 2017-11-13 MED ORDER — CEFTRIAXONE 1 GRAM/50 ML IN DEXTROSE (ISO-OSMOT) INTRAVENOUS PIGGYBACK
1.0000 g | INJECTION | INTRAVENOUS | Status: AC
Start: 2017-11-13 — End: 2017-11-17
  Administered 2017-11-13: 0 g via INTRAVENOUS
  Administered 2017-11-13 – 2017-11-14 (×2): 1 g via INTRAVENOUS
  Administered 2017-11-14 – 2017-11-15 (×2): 0 g via INTRAVENOUS
  Administered 2017-11-15: 1 g via INTRAVENOUS
  Administered 2017-11-16: 0 g via INTRAVENOUS
  Administered 2017-11-16: 1 g via INTRAVENOUS
  Administered 2017-11-17: 0 g via INTRAVENOUS
  Administered 2017-11-17: 1 g via INTRAVENOUS
  Filled 2017-11-13 (×5): qty 50

## 2017-11-13 MED ORDER — ONDANSETRON HCL (PF) 4 MG/2 ML INJECTION SOLUTION
4.0000 mg | Freq: Three times a day (TID) | INTRAMUSCULAR | Status: DC | PRN
Start: 2017-11-13 — End: 2017-11-22
  Administered 2017-11-18: 4 mg via INTRAVENOUS
  Filled 2017-11-13: qty 2

## 2017-11-13 MED ORDER — HYDROCORTISONE SOD SUCCINATE 100 MG/2 ML VIAL WRAPPER
50.0000 mg | Freq: Four times a day (QID) | INTRAMUSCULAR | Status: DC
Start: 2017-11-13 — End: 2017-11-15
  Administered 2017-11-13 – 2017-11-15 (×7): 50 mg via INTRAVENOUS
  Filled 2017-11-13 (×7): qty 2

## 2017-11-13 MED ORDER — ALBUMIN, HUMAN 5 % INTRAVENOUS SOLUTION
12.50 g | INTRAVENOUS | Status: AC
Start: 2017-11-13 — End: 2017-11-13
  Administered 2017-11-13: 0 g via INTRAVENOUS
  Administered 2017-11-13: 12.5 g via INTRAVENOUS
  Filled 2017-11-13: qty 250

## 2017-11-13 MED ORDER — ONDANSETRON HCL (PF) 4 MG/2 ML INJECTION SOLUTION
INTRAMUSCULAR | Status: AC
Start: 2017-11-13 — End: 2017-11-13
  Administered 2017-11-13 (×2): 4 mg via INTRAVENOUS
  Filled 2017-11-13: qty 2

## 2017-11-13 MED ADMIN — norepinephrine bitartrate 16 mg/250 mL (64 mcg/mL) in 0.9 % NaCl IV: INTRAVENOUS | @ 04:00:00

## 2017-11-13 MED ADMIN — pantoprazole 40 mg intravenous solution: INTRAVENOUS | @ 16:00:00

## 2017-11-13 MED ADMIN — sodium chloride 0.9 % (flush) injection syringe: INTRAVENOUS | @ 01:00:00

## 2017-11-13 MED ADMIN — DEXTROSE 10% E48 W/ ADDITIVES: INTRAVENOUS | @ 15:00:00

## 2017-11-13 NOTE — Consults (Signed)
[facility]  HVI Advanced Wound Care Initial Consult    Andrew Arias, Andrew Arias, 62 y.o. male  Date of Birth:  12/28/1955  Date of service: 11/13/2017  Encounter Start Date: 11/11/2017  Inpatient Admission Date:  11/11/2017    Information Obtained from: patient and history reviewed via medical record  Chief Complaint: lower extremity venous wounds    PCP: No Pcp  Consult Requested By: lower extremity venous wounds  11/12/17 1738  IP CONSULT TO HVI - ADVANCED WOUND CARE OF LOWER EXTREMITY ONE TIME Complete   Process Instructions: Please page [phone] from Monday-Friday 7:30am - 3:30pm.   References: ON CALL ([place])   Provider: (Not yet assigned)   Question Answer Comment   Reason for Consult lower extremity venous wounds    Evaluate and: WRITE ORDERS AND GIVE RECOMMENDATIONS    Is Discharge Pending This Being Completed? No               HPI: (must include no less than 4 of the following main descriptors) Location (of pain): Quality (character of pain) Severity (minimal, mild, severe, scale or 1-10) Duration (how long has pain/sx present) Timing (when does pain/sx occur)  Context (activity at/before onset) Modifying Factors (what makes pain/sx  Better/worse) Associate Sign/Sx (what accompanies main pain/sx)     Andrew Arias is a 62 y.o., White male who presents with lower extremity venous wounds. He has a past medical history of CAD s/p CABG, CHF, Atrial fibrillation (s/p cardiac ablation), Pacemaker and defibrillator, Cardiomyopathy of EF of 25 %, Long Term anticoagulation on Warfarin, CKD III, Diabetes Mellitus type II (Hgb A1 c of 8.6), Neuropathy of bilateral feet,BPH, Gout, Arthritis, Osteomyelitis of right lateral malleolus in 2015, Anemia, Morbid Obesity, cellulitis. He states that Friday 11/09/2017 he "was out of it and I do not remember. My wife called an ambulance to go to the ER.". Patient was admitted to outside facility and was transferred here for a higher level of care. He states his last Bumex was 11/08/2017  night dose. He was found to be hypotensive and IV fluid was provided. He was later started on Levophed. The patient was admitted to ICU with continued Levophed. The patient was admitted for sepsis possible urosepsis with urinary retention.The HVI advanced wound care team was consulted for right lower extremity shin wound as well as open right ankle wound.    Consult day 2 for right lower extremity wound and right ankle wound    ROS:  MUST comment on all "Abnormal" findings   ROS   Constitutional: positive for alteration of consciousness  Eyes: negative  Ears, nose, mouth, throat, and face: negative  Respiratory: positive for dyspnea on exertion  Cardiovascular: positive for lower extremity edema  Gastrointestinal: negative  Genitourinary:positive for urinary retension  Integument/breast: positive for right ankle wound  Hematologic/lymphatic: negative  Musculoskeletal:positive for muscle weakness  Neurological: negative  Behavioral/Psych: negative  Endocrine: positive for diabetes  Allergic/Immunologic: negative      PAST MEDICAL/ FAMILY/ SOCIAL HISTORY:       Past Medical History:   Diagnosis Date   . A-fib (CMS HCC)     s/p ablation   . Anemia    . Cardiomyopathy (CMS [place])    . CHF (congestive heart failure) (CMS HCC)    . CKD (chronic kidney disease), stage III (CMS HCC)    . Colonic polyp    . Constipation    . DM2 (diabetes mellitus, type 2) (CMS HCC)    . Gout    .  History of cellulitis    . HLD (hyperlipidemia)    . HTN (hypertension)    . Pacemaker          No Known Allergies  Medications Prior to Admission     Prescriptions    allopurinol (ZYLOPRIM) 300 mg Oral Tablet    Take 300 mg by mouth Once a day    aspirin 81 mg Oral Tablet, Chewable    Take 324 mg by mouth Once a day    bumetanide (BUMEX) 2 mg Oral Tablet    Take 2 mg by mouth Twice daily    carvedilol (COREG) 12.5 mg Oral Tablet    Take 12.5 mg by mouth Twice daily with food    colchicine 0.6 mg Oral Tablet    Take 0.6 mg by mouth Every Monday,  Wednesday and Friday    digoxin (LANOXIN) 125 mcg Oral Tablet    Take 0.125 mg by mouth Once a day    gabapentin (NEURONTIN) 300 mg Oral Capsule    Take 300 mg by mouth Three times a day Take 600mg  in the morning, followed by 300mg  in the afternoon, then 600mg  in the evening.    Leflunomide (ARAVA) 20 mg Oral Tablet    Take 20 mg by mouth Once a day    magnesium oxide (MAG-OX) 400 mg (241.3 mg magnesium) Oral Tablet    Take 400 mg by mouth Once a day    metFORMIN (GLUCOPHAGE) 500 mg Oral Tablet    Take 500 mg by mouth Twice daily with food    pioglitazone (ACTOS) 15 mg Oral Tablet    Take 15 mg by mouth Once a day    predniSONE (DELTASONE) 2.5 mg Oral Tablet    Take 2.5 mg by mouth Once a day before lunch    sacubitril-valsartan (ENTRESTO) 49-51 mg Oral Tablet    Take 1 Tab by mouth Twice daily    simvastatin (ZOCOR) 20 mg Oral Tablet    Take 20 mg by mouth Every evening           Current Facility-Administered Medications:  acetaminophen (TYLENOL) tablet 650 mg Oral Q4H PRN   ceFEPime (MAXIPIME) 2 g in iso-osmotic 100 mL premix IVPB 2 g Intravenous Q12H   digoxin (LANOXIN) tablet 125 mcg Oral Daily   docusate sodium (COLACE) capsule 100 mg Oral 2x/day   insulin glargine (LANTUS) 100 units/mL injection 10 Units Subcutaneous NIGHTLY   linezolid (ZYVOX) tablet 600 mg Oral 2x/day   norepinephrine (LEVOPHED) 16 mg in NS 2101mL premix infusion 0.03 mcg/kg/min Intravenous Continuous   NS flush syringe 2 mL Intracatheter Q8HRS   And      NS flush syringe 2-6 mL Intracatheter Q1 MIN PRN   nystatin (NYSTOP) 100,000 units/g topical powder  Apply Topically 2x/day   sennosides-docusate sodium (SENOKOT-S) 8.6-50mg  per tablet 1 Tab Oral 2x/day   simvastatin (ZOCOR) tablet 20 mg Oral QPM   SSIP insulin lispro (HUMALOG) 100 units/mL injection 3-9 Units Subcutaneous 4x/day PRN     Past Surgical History:   Procedure Laterality Date   . HX CORONARY ARTERY BYPASS GRAFT           Family History:  Family Medical History:     Problem  Relation (Age of Onset)    Coronary Artery Disease Father        Social History     Tobacco Use   . Smoking status: Former Smoker     Last attempt to quit: 2012  Years since quitting: 7.2   Substance Use Topics   . Alcohol use: Yes     Frequency: 2-4 times a month   . Drug use: Never         PHYSICAL EXAMINATION: MUST comment on all "Abnormal" findings    Exam Temperature: 36.8 C (98.2 F)  Heart Rate: 79  BP (Non-Invasive): 109/62  Respiratory Rate: 19  SpO2-1: 96 %  Pain Score (Numeric, Faces): 0  Constitutional:  acutely ill, morbidly obese, appears stated age, no distress and vital signs reviewed  Eyes:  Conjunctiva clear.  ENT:  Nose without erythema. , Mouth mucous membranes moist. , face symmetrical  Neck:  supple, symmetrical, trachea midline  Respiratory:  respirations are non labored and regular with oxygen supplementation via nasal cannula  Cardiovascular:  pedal edema 2 plus+ right > left     with positive hemosideran staining of bilateral lower extremity, feet cool and dry and unable to palpate pedal pulses  Gastrointestinal:  Soft, non-tender, non-distended  Genitourinary:  indwelling foley catheter in place with dark tea colored urine in collection system  Musculoskeletal:  Head atraumatic and normocephalic and AROM  Integumentary:  skin warm to upper extremites and cool to bilateral feet, dystrophic toenails, right lateral open malleous wound, right shin anterior traumatic wound from possible skin tear vs ruptur of bullae, right ankle open wound with macerated edges, dry skin to bilateral lower extremities  Neurologic:  Grossly normal  Psychiatric:  Normal     Wound Assessment  Right lower extremity dressing      Right lower extremity shin wound      Wound #1  Type: Traumatic vs skin tear vs vascular ulceration  Location: Anterior, Lower and Right leg  Length: 7.1 cm  Width: 4.4 cm  Depth: 0.1 cm  Undermining/Tunneling: none noted on this exam  Wound Base %: pink tissue  Wound Edges:  rolled  Drainage amt: Large  Serous drainage with odor Absent  Peri-wd Skin: peri wound slight erythema amongst hemosiderin stain, no fluctuance, no crepitous and no necrosis      Right lateral malleolus      Wound #2  Type: Diabetic  Location:  Right ankle  Length: 2.4 cm  Width: 1.5 cm  Depth: 0.2 cm  Undermining/Tunneling: non noted on this exam  Wound Base %: fibrinous tissue  Wound Edges: rolled  Drainage amt: Small amount  Serous drainage with odor Absent  Peri-wd Skin: Peri wound skin with thick Xerosis, macerated edges, no crepitous, no necrosis and no fluctuance.      Labs Ordered/ Reviewed (Please indicate ordered or reviewed)   Reviewed: Labs:  Lab Results Today:    Results for orders placed or performed during the hospital encounter of 11/11/17 (from the past 24 hour(s))   INFLUENZA VIRUS TYPE A AND TYPE B, AND RESPIRATORY SYNCYTIAL VIRUS (RSV), PCR   Result Value Ref Range    INFLUENZA VIRUS TYPE A Not Detected Not Detected    INFLUENZA VIRUS TYPE B Not Detected Not Detected    RESPIRATORY SYNCYTIAL VIRUS (RSV) Not Detected Not Detected   POC BLOOD GLUCOSE (RESULTS)   Result Value Ref Range    GLUCOSE, POC 178 (H) 70 - 105 mg/dl   POC BLOOD GLUCOSE (RESULTS)   Result Value Ref Range    GLUCOSE, POC 124 (H) 70 - 105 mg/dl   TROPONIN-I   Result Value Ref Range    TROPONIN I 23 0 - 30 ng/L   POC BLOOD GLUCOSE (RESULTS)  Result Value Ref Range    GLUCOSE, POC 132 (H) 70 - 105 mg/dl   TROPONIN-I   Result Value Ref Range    TROPONIN I 26 0 - 30 ng/L   PT/INR daily X 7   Result Value Ref Range    PROTHROMBIN TIME 42.9 (H) 9.5 - 14.1 seconds    INR 3.78 (H) 0.80 - 1.20   CBC - DAY 2   Result Value Ref Range    WBC 9.5 3.5 - 11.0 x10^3/uL    RBC 3.41 (L) 4.06 - 5.63 x10^6/uL    HGB 10.8 (L) 12.5 - 16.3 g/dL    HCT 32.3 (L) 36.7 - 47.0 %    MCV 94.6 78.0 - 100.0 fL    MCH 31.8 27.4 - 33.0 pg    MCHC 33.6 32.5 - 35.8 g/dL    RDW 18.4 (H) 12.0 - 15.0 %    PLATELETS 70 (L) 140 - 450 x10^3/uL    MPV 8.3 7.5 - 11.5  fL   BASIC METABOLIC PANEL   Result Value Ref Range    SODIUM 134 (L) 136 - 145 mmol/L    POTASSIUM 3.8 3.5 - 5.1 mmol/L    CHLORIDE 101 96 - 111 mmol/L    CO2 TOTAL 24 22 - 32 mmol/L    ANION GAP 9 4 - 13 mmol/L    CALCIUM 8.2 (L) 8.5 - 10.2 mg/dL    GLUCOSE 135 65 - 139 mg/dL    BUN 70 (H) 8 - 25 mg/dL    CREATININE 1.98 (H) 0.62 - 1.27 mg/dL    BUN/CREA RATIO 35 (H) 6 - 22    ESTIMATED GFR 34 (L) >59 mL/min/1.52m^2   MAGNESIUM   Result Value Ref Range    MAGNESIUM 2.2 1.6 - 2.5 mg/dL   PHOSPHORUS   Result Value Ref Range    PHOSPHORUS 2.5 2.3 - 4.0 mg/dL   TROPONIN-I   Result Value Ref Range    TROPONIN I 22 0 - 30 ng/L   POC BLOOD GLUCOSE (RESULTS)   Result Value Ref Range    GLUCOSE, POC 129 (H) 70 - 105 mg/dl       Radiology Tests Ordered/ Reviewed (Please indicate ordered or reviewed)   Reviewed:  CXR 11/11/2017      ADDENDUM:    Body of the report should state biventricular pacemaker defibrillator is  noted.        Radiologist location ID: [id number]     Addended by Garen Lah, MD on 11/12/2017 9:14 AM      Study Result     [place]  Male, 62 years old.    XR AP MOBILE CHEST performed on 11/11/2017 10:50 AM.    REASON FOR EXAM:  Admitted with sepsis. Likely urinary source but  evaluating others.    TECHNIQUE: 1 view/1 image(s) submitted for interpretation.    FINDINGS: No priors available for comparison. The heart is enlarged and  there are postoperative changes from median sternotomy with a broken  sternotomy wire identified. Left basilar atelectasis is present. No  evidence for pulmonary edema.    Right central line tip projects over the mid SVC. Negative defibrillator is  noted.      IMPRESSION:  Cardiomegaly without acute process.            EKG 11/11/2017  Narrative     Ventricular-paced rhythm  Abnormal ECG  No previous ECGs available  Confirmed by [facility] MD, ROBERT ([id number]) on 11/12/2017 12:54:35  PM   Result Data     Result Status Result Component Value Units   Final result [3]  Ventricular rate 74 BPM    Atrial Rate 64 BPM    QRS Duration 186 ms    QT Interval 490 ms    QTC Calculation 543 ms    Calculated R Axis -103 degrees    Calculated T Axis 60 degrees     ECHO 11/12/2017  Conclusions:  1. Normal left ventricular size. Severely depressed left ventricular systolic function. LV Ejection Fraction is  25 %. Normal geometry.  2. Resting Segmental Wall Motion Analysis: Total wall motion score is 2.31. There is akinesis of the mid to  apical anterolateral wall. There is akinesis of the apical cap. There is akinesis of the apical inferoseptal  wall. The mid to apical anterior wall is not visualized. The mid to apical inferior wall is not visualized. The  remaining left ventricular segments demonstrate hypokinesis.  3. Mildly dilated right ventricle. Mildly depressed right ventricular systolic function.    ABI/PVR pending     IMPRESSION:    Right anterior lower extremity (shin) with traumatic wound of possible skin tear  Right ankle lateral malleolus with chronic open wound,Osteomyelitis of right lateral malleolus in 2015,  Moderate Protein Calorie Malnutrition with albumin of 2.6 on 11/11/2017  Sepsis with possible Urosepsis   Hypotension- fluid bolus and Levophed - being weaned as of 11/13/2017  BPH- urinary retention- indwelling foley catheter  CAD s/p CABG  CHF  Atrial fibrillation (s/p cardiac ablation)  Pacemaker and defibrillator  Cardiomyopathy of EF of 25 %  Long Term anticoagulation on Warfarin  CKD III  Diabetes Mellitus type II (Hgb A1 c of 8.6 on 11/12/2017)  Neuropathy of bilateral feet  BPH  Gout  Arthritis  Anemia  Morbid Obesity  Cellulitis.     Mr. Andrew Arias is a 62 year old caucasian male that was admitted with sepsis and hypotension. He has had fluid resuscitation as well as Levophed,. Levophed was being weaned. Patient has stated that Friday 11/09/2016 he had altered level of consciousness and was taken to the nearest hospital. He was treated and transferred to [facility] for a  higher level of care. He states he had not had Bumex since Thursday night and had increased edema of lower extremities. The right anterior shin with open wound traumatic possible skin care vs bullae. Right lateral malleolus open wound. Patient states he had osteomyelitis of the right ankle in 2015. He states he did follow with wound care at an outside facility. The osteo was treated with external fixator for 4 months and was in a skilled care facility during that time. He remained to follow with that medical provider. He stated after one year the wound closed. He has neuropathy of bilateral feet and did not know he had an open wound and no care has been done. He ambulates with a cane due to the right ankle.    He is multiple co morbidities the first one being morbidly obese with limited movement and ambulation with a cane. He has Diabetes Mellitus that is not controlled and will need tight glycemic control. He also will be having ABI and PVR of lower extremities. I would avoid compression of bilateral lower extremities due to EF of 25%. He also will need correction of moderate calorie protein malnutrition.   Legs elevated   Application of Rooke boots  Tight glycemic control of diabetes mellitus  Correction of Protein Calorie Malnutrition  Due to the patient over all health status and multiple co morbidities of decreased mobility, moderate protein malnutrition and skin fragility, pre exsiting wounds, Mr. Claiborne Stroble is high risk for further skin breakdown.    Recommendations (if Consult)   -Specialty Bed may stay on the bed he is presently on  -Meticulous Hygiene to the involved area  -Clean wound with Saline/Wound Cleanser and apply dressing of Santyl nickel thick with fluffed 4 x 4 to the right lateral malleolus, cover with ABD and rolled gauze  Q daily and prn  -Clean wound with saline/wound cleanser and apply dressing of Melgisorb AG , cover with ABD and kerlix to the right lower extremity Q day and  prn  -Offload heels from bed with placement of pillow underneath calves  - Rooke boots bilateral lower extrmities  -Albumin was 2.6 on 11/11/2017  -HA1C Level was 8.2 on 11/12/2017  -Nutrition Services consult for Protein Malnutrition  -PT/ OT to evaluate and treat for lower extremity weakness  -Case management for home health needs and discharge planning    I saw this patient independently of Dr.Fintan Grater and spent greater than 50% of a 45 minute visit counseling the patient on dressing changes as well as his bedside nurse. The nurse is aware not to apply compression to bilateral lower extremities. The right anterior lower extremity wound with Melgisorb AG. The right ankle will be dressed with Santyl daily. I spoke with the patient that he does not wear diabetic shoes. I informed him that with his insurance he can get new diabetic shoes yearly due to the neuropathy. He also is aware he should see a podiatrist yearly.    Steva Colder, APRN,FNP-BC beeper [phone]      The patient was seen independently by the APP  Unless directly noted, I did not evaluate the patient.  Signature required by employer    Roslyn Smiling, MD

## 2017-11-13 NOTE — Care Management Notes (Signed)
[facility]  Care Management Note    Patient Name: Andrew Arias  Date of Birth: 12/23/1955  Sex: male  Date/Time of Admission: 11/11/2017  7:20 AM  Room/Bed: 520/A  Payor: [place] / Plan: [place] [facility] / Product Type: MEDICARE [facility] /    LOS: 2 days   Primary Care Providers:  Pcp, No (General)    Admitting Diagnosis:  UTI (urinary tract infection) [N39.0]    Assessment:      11/13/17 0834   Patient Hand-Off   Clinical/Discharge Plan of Care Information Communicated to:  Medical Social Worker   Comments MSW Hensch     The patient will continue to be evaluated for developing discharge needs.     Case Manager: Harriette Ohara, [place] COORDINATOR  Phone: [phone]

## 2017-11-13 NOTE — Progress Notes (Signed)
MICU Progress Note     Name: Andrew, Arias, 62 y.o. male Date of Service:  11/13/2017   Date of Birth:  01-Jan-1956 Date of Admission:  11/11/2017   PCP: No Pcp Length of Stay:  LOS: 2 days    MRN: [id number] Attending: Zachery Conch, MD   Code Status: Full Code Admitted for: Septic shock (CMS [place])     Subjective / Interval Events:   No acute events overnight.  Mildly soft MAPs overnight, asymptomatic in terms of mentation; pt received albumin and levophed at low dose was restarted.        Objective:     VITAL SIGNS:  Temperature: 36.8 C (98.2 F) BP (Non-Invasive): (!) 94/58 Heart Rate: 84   Respiratory Rate: 19 SpO2-1: 93 % Weight: (!) 162 kg (357 lb 2.3 oz)        INTAKE & OUTPUT:    Intake/Output Summary (Last 24 hours) at 11/13/2017 1852  Last data filed at 11/13/2017 1800  Gross per 24 hour   Intake 837.06 ml   Output 1785 ml   Net -947.94 ml     Last Bowel Movement: 11/13/17     HEMODYNAMICS:        VENTILATOR SETTINGS:  Not on Ventilator     WEANING PARAMETERS:   N/A     PHYSICAL EXAMINATION:  General: NAD, appears chronically ill  HENT: NC/AT; oral mucous membranes moist  Eyes: conjunctivae clear, non-icteric; PERRLA, L lateral upper lid lesion  Neck: trachea midline  Cardiovascular: RRR; normal U9/N2; holosystolic murmur  Pulmonary / Chest: Normal respiratory effort; CTAB but mild crackles at bilateral bases.  NC in place  GI / Abdominal: Morbidly obese, soft, non-distended; BS normal; no TTP  Extremities: bilateral pitting edema; no cyanosis; well-perfused  Skin: Warm and dry, venous stasis dermatitis changes over bilateral legs with hyperpigmentation and some superficial fluid blisters, R>L; wound dressings in place  Neurologic: CN grossly intact. Mentation improved     LINES / TUBES / DRAINS:   - central line L arm  - PIV  - foley     Labs:     I have reviewed all labs    Inpatient Medications:    I have reviewed all medications    Imaging / Studies:    I have reviewed all imaging studies    Microbiology:     - blood Cx 3/17: ngtd  - blood Cx 3/18: ngtd  - urine Cx 3/17: ngtd  - MRSA negative  - C diff negative    Antimicrobials Start date End date   1. cefepime 3/17 3/19   2. linezolid 3/17 3/19   3. rocephin 3/19    4.      5.          Medical Decision Making:     Assessment / Plan: Andrew Arias is a 62 y.o. male here for Septic shock (CMS [place])    Septic shock 2/2 UTI/pyelonephritis vs LL cellulitis:   Status/DDx: Stable, but still requiring levophed.  Mentation greatly improved  Workup:   - blood, urine Cx negative  - urology consulted, appreciate recs  - RUQ to r/o cholecystitis  Tx:   - d/c linezolid, cefepime;   - narrow Abx to rocephin  - levophed, wean as tolerated     HFrEF  A-fib on warfarin  supratherapeutic INR  concern for cardiogenic shock component:   Status/DDx: No cardiac symptoms, no baseline O2 requirement.  TTE results and decreased UOP off low dose  levophed are concerning for possible cardiorenal syndrome  Workup:   - cardiology EPS consulted; pacer functioning correctly  - TTE; EF 25% with multiple regions of marked akinesis/hypokinesis  Tx:   - tele  - monitor INR  - hold warfarin  - continue digoxin    Bladder dome lesion:  - CT renal calculus protocol and cystogram reviewed  - urology consulted   - s/p uretheral stricture dilation   - no additional imaging requiring   - pt will need f/u cystoscopy outpatient; urology has referred   - pt to be d/c with foley    AKI on CKD III (improving):  - likely 2/2 septic shock; per report, baseline Cr ~1.6; was 3.6 at OSH  - urine lytes suggest pre-renal etioogy  - strict I/Os    Probable left psoas hematoma:  - noted on CT 3/18  - no hx recent trauma; likely 2/2 supratherapeutic INR    Lymphedema  lower extremity wounds:  - wound care consulted; appreciate recs    DM2, uncontrolled:  - A1c 8.2  - hold home PO agents  - SSI  - lantus 10 u qhs  - holding gabapentin given AMS on presentation and AKI      Chronic medical problems:  - CAD s/p CABG   HTN  HLD: hold coreg, bumex, entresto (non-formulary), statin  - gout: hold allopurinol, colchicine  - BPH: hold tamsulosin  - Hypomagnesemia: hold PO supplement, replace prn  - RA: restart leflunamide, prednisone    ____________________________________________________________________    Prophylaxis:   - DVT: held d/t INR   - GI: Not indicated  Analgesia: None  Nutrition: Diabetic  Therapy: OT and PT  Sedation: none  Last bowel movement: 3/19  Bowel regimen: senna, colace    Consults: urology      Virl Cagey. Caron Presume, MD  Transitional Year PGY-1  Pager: [phone]    -----------------------------------------------------------------------------------------------------------------------------------------------------  Shock- likely mixed... Septic (pyelonephritis and/or cellulitis) + cardiogenic component. Stress dose steroids to avoid secondary adrenal insufficiency. Pt on chronic prednisone therapy for RA. Abx de-escalated as above. Weaning pressors as tolerated by renal function/ mental status and hemodynamics. Prn albumin boluses. Consider inotropic support/ advanced HF consult for persistent pressors requirement. Prognosis guarded.     I saw and examined the patient, personally performed the critical or key portions of the service and discussed patient management with the ICU team including the resident, fellow, nurse and ancillary staff. I reviewed the resident's note and agree with the history, findings and plan of care. Laboratory, hemodynamic, respiratory support, and radiological data were also reviewed and discussed. Exceptions are edited/noted above.    Critical Care time: 37 mins    Dannilynn Gallina Jacquelyne Balint, MD  Section of Pulmonary, Critical Care and Sleep Medicine  Department of Medicine, [facility]  11/13/2017, 22:36

## 2017-11-13 NOTE — Nurses Notes (Signed)
11/13/17 2200   Vital Signs   Heart Rate 76   BP (Non-Invasive) (!) 82/41   MAP (Non-Invasive) 54 mmHG   Oxygen Therapy   SpO2-1 97 %   increased levo gtt

## 2017-11-13 NOTE — Nurses Notes (Signed)
Pt BP slowing trending down along with urine output. MICU 2 stated restart levo. Zenaida Niece, RN  11/13/2017, 13:26

## 2017-11-13 NOTE — Nurses Notes (Signed)
Called MICU, Larkin Ina, and notified him of pt having a 6 beat run of vtach. Pt is back in the 70s now. No new orders given. Will continue to monitor.

## 2017-11-13 NOTE — Nurses Notes (Signed)
Pt having 20 beat run [place]. MICU notified. Order for EKG/LABS being placed. Zenaida Niece, RN  11/13/2017, 13:50

## 2017-11-14 DIAGNOSIS — I255 Ischemic cardiomyopathy: Secondary | ICD-10-CM

## 2017-11-14 DIAGNOSIS — I5023 Acute on chronic systolic (congestive) heart failure: Secondary | ICD-10-CM

## 2017-11-14 LAB — ECG 12-LEAD
Atrial Rate: 82 {beats}/min
QT Interval: 478 ms
QTC Calculation: 558 ms
Ventricular rate: 82 {beats}/min

## 2017-11-14 LAB — URINALYSIS, MACROSCOPIC
COLOR: NORMAL
KETONES: NEGATIVE mg/dL
LEUKOCYTES: NEGATIVE WBCs/uL
NITRITE: NEGATIVE
PROTEIN: 100 mg/dL — AB
SPECIFIC GRAVITY: 1.01 (ref 1.005–1.030)
UROBILINOGEN: 2 mg/dL — AB

## 2017-11-14 LAB — BASIC METABOLIC PANEL
ANION GAP: 14 mmol/L — ABNORMAL HIGH (ref 4–13)
CALCIUM: 8.6 mg/dL (ref 8.5–10.2)
CO2 TOTAL: 17 mmol/L — ABNORMAL LOW (ref 22–32)
CREATININE: 1.78 mg/dL — ABNORMAL HIGH (ref 0.62–1.27)
ESTIMATED GFR: 39 mL/min/{1.73_m2} — ABNORMAL LOW (ref >59)
GLUCOSE: 282 mg/dL — ABNORMAL HIGH (ref 65–139)
POTASSIUM: 4.3 mmol/L (ref 3.5–5.1)
SODIUM: 132 mmol/L — ABNORMAL LOW (ref 136–145)

## 2017-11-14 LAB — TROPONIN-I
TROPONIN I: 17 ng/L (ref 0–30)
TROPONIN I: 13 ng/L (ref 0–30)

## 2017-11-14 LAB — PT/INR: PROTHROMBIN TIME: 46.8 seconds — ABNORMAL HIGH (ref 9.5–14.1)

## 2017-11-14 LAB — CBC
HCT: 33.6 % — ABNORMAL LOW (ref 36.7–47.0)
HGB: 11 g/dL — ABNORMAL LOW (ref 12.5–16.3)
MCH: 31.1 pg (ref 27.4–33.0)
MCHC: 32.9 g/dL (ref 32.5–35.8)
MCV: 94.6 fL (ref 78.0–100.0)
MPV: 9.3 fL (ref 7.5–11.5)
PLATELETS: 86 10*3/uL — ABNORMAL LOW (ref 140–450)
RBC: 3.55 10*6/uL — ABNORMAL LOW (ref 4.06–5.63)
RDW: 18.3 % — ABNORMAL HIGH (ref 12.0–15.0)
WBC: 13.8 10*3/uL — ABNORMAL HIGH (ref 3.5–11.0)

## 2017-11-14 LAB — URINALYSIS, MICROSCOPIC

## 2017-11-14 LAB — POC BLOOD GLUCOSE (RESULTS): GLUCOSE, POC: 282 mg/dl — ABNORMAL HIGH (ref 70–105)

## 2017-11-14 LAB — LIPASE: LIPASE: 96 U/L — ABNORMAL HIGH (ref 10–80)

## 2017-11-14 LAB — PHOSPHORUS: PHOSPHORUS: 2.6 mg/dL (ref 2.3–4.0)

## 2017-11-14 MED ORDER — COLLAGENASE CLOSTRIDIUM HISTOLYTICUM 250 UNIT/GRAM TOPICAL OINTMENT
TOPICAL_OINTMENT | Freq: Every day | CUTANEOUS | Status: DC
Start: 2017-11-14 — End: 2017-11-21
  Administered 2017-11-20: 0 via TOPICAL
  Filled 2017-11-14: qty 30

## 2017-11-14 MED ORDER — FUROSEMIDE 10 MG/ML INJECTION SOLUTION
40.0000 mg | Freq: Once | INTRAMUSCULAR | Status: AC
Start: 2017-11-14 — End: 2017-11-14
  Administered 2017-11-14: 40 mg via INTRAVENOUS
  Filled 2017-11-14: qty 4

## 2017-11-14 MED ORDER — INSULIN GLARGINE (U-100) 100 UNIT/ML SUBCUTANEOUS SOLUTION
20.0000 [IU] | Freq: Every day | SUBCUTANEOUS | Status: DC
Start: 2017-11-14 — End: 2017-11-15
  Administered 2017-11-14: 20 [IU] via SUBCUTANEOUS
  Filled 2017-11-14: qty 20

## 2017-11-14 MED ORDER — INSULIN LISPRO 100 UNIT/ML SUB-Q SSIP
4.00 [IU] | INJECTION | Freq: Four times a day (QID) | SUBCUTANEOUS | Status: DC | PRN
Start: 2017-11-14 — End: 2017-11-18
  Administered 2017-11-14: 12 [IU] via SUBCUTANEOUS
  Administered 2017-11-14 – 2017-11-15 (×5): 8 [IU] via SUBCUTANEOUS
  Administered 2017-11-16: 4 [IU] via SUBCUTANEOUS
  Administered 2017-11-16: 8 [IU] via SUBCUTANEOUS
  Administered 2017-11-16 – 2017-11-17 (×3): 4 [IU] via SUBCUTANEOUS

## 2017-11-14 MED ADMIN — magnesium hydroxide 400 mg/5 mL oral suspension: @ 21:00:00

## 2017-11-14 MED ADMIN — electrolyte-A intravenous solution: ORAL | @ 21:00:00 | NDC 00338022104

## 2017-11-14 MED ADMIN — ELECTROLYTE-A W/ ADDITIVES: INTRAVENOUS | @ 10:00:00

## 2017-11-14 NOTE — Progress Notes (Signed)
MICU Progress Note     Name: Torrian, [place], 62 y.o. male Date of Service:  11/15/2017   Date of Birth:  12-Sep-1955 Date of Admission:  11/11/2017   PCP: No Pcp Length of Stay:  LOS: 4 days    MRN: [id number] Attending: Zachery Conch, MD   Code Status: Full Code Admitted for: Septic shock (CMS [place])     Subjective / Interval Events:   No acute events overnight.  MAPs stable with levophed d/c early last evening.  Pt tolerating diet, has no pain/complaints.        Objective:     VITAL SIGNS:  Temperature: 36.6 C (97.9 F) BP (Non-Invasive): 115/82 Heart Rate: 77   Respiratory Rate: 20 SpO2: 95 % Weight: (!) 162 kg (357 lb 2.3 oz)        INTAKE & OUTPUT:    Intake/Output Summary (Last 24 hours) at 11/15/2017 1254  Last data filed at 11/15/2017 0600  Gross per 24 hour   Intake 7.75 ml   Output 1825 ml   Net -1817.25 ml     Last Bowel Movement: 11/14/17     HEMODYNAMICS:        VENTILATOR SETTINGS:  Not on Ventilator     WEANING PARAMETERS:   N/A     PHYSICAL EXAMINATION:  General: NAD, appears chronically ill  HENT: NC/AT; oral mucous membranes moist  Eyes: conjunctivae clear, non-icteric; PERRLA, L lateral upper lid lesion  Neck: trachea midline  Cardiovascular: RRR; normal V4/U9; holosystolic murmur  Pulmonary / Chest: Normal respiratory effort; CTAB but mild crackles at bilateral bases.  NC in place  GI / Abdominal: Morbidly obese, soft, non-distended; BS normal; no TTP  Extremities: bilateral pitting edema; no cyanosis; well-perfused  Skin: Warm and dry, venous stasis dermatitis changes over bilateral legs with hyperpigmentation and some superficial fluid blisters, R>L; wound dressings in place  Neurologic: CN grossly intact. Mentation improved     LINES / TUBES / DRAINS:   - central line L arm  - PIV  - foley     Labs:     I have reviewed all labs    Inpatient Medications:    I have reviewed all medications    Imaging / Studies:    I have reviewed all imaging studies    Microbiology:    - blood Cx 3/17: ngtd  - blood  Cx 3/18: ngtd  - urine Cx 3/17: ngtd  - MRSA negative  - C diff negative    Antimicrobials Start date End date   1. cefepime 3/17 3/19   2. linezolid 3/17 3/19   3. rocephin 3/19 3/23   4.      5.          Medical Decision Making:     Assessment / Plan: Dianna Deshler is a 62 y.o. male here for Septic shock (CMS [place])    Shock, likely combined septic and cardiogenic 2/2 UTI/pyelonephritis vs LL cellulitis (resolved):   Status/DDx: Stable, off pressors; nob obvious source identified  Workup:   - blood, urine Cx negative  - urology consulted, appreciate recs  - RUQ w/o acute cholecystitis but possible chronic changes  Tx:   - s/p linezolid, cefepime; continue rocephin x 7 days   - s/p levophed  - decrease hydrocortisone 50 to q 12hr, continue home prednisone 2.5  - hold home antihypertensives     HFrEF  A-fib on warfarin  supratherapeutic INR  cardiogenic shock (resolved):   Status/DDx: Pt follows  in cardiology with Dr. Jerral [place]ito in [place], [place]  Workup:   - cardiology EPS consulted; pacer functioning correctly  - TTE; EF 25% with multiple regions of marked akinesis/hypokinesis  - cardiology HF consulted; appreciate recs  Tx:   - tele  - monitor INR  - hold warfarin  - continue digoxin  - restart bumex 2 mg qd  - fluid restriction 2L, daily weight    Bladder dome lesion:  - CT renal calculus protocol and cystogram reviewed  - urology consulted   - s/p uretheral stricture dilation   - no additional imaging requiring   - pt will need f/u cystoscopy outpatient; urology has referred   - pt to be d/c with foley    AKI on CKD III (improving):  - likely 2/2 septic shock; per report, baseline Cr ~1.6; was 3.6 at OSH  - urine lytes suggest pre-renal etioogy  - strict I/Os    Probable left psoas hematoma:  - noted on CT 3/18, asymptomatic  - no hx recent trauma; likely 2/2 supratherapeutic INR    Lymphedema  lower extremity wounds:  - wound care and lymphedema OT consulted; appreciate recs    DM2, uncontrolled:  - A1c  8.2  - hold home PO agents  - SSI  - increase lantus to 25 u qhs  - holding gabapentin given AMS on presentation and AKI      Chronic medical problems:  - CAD s/p CABG  HTN  HLD: hold coreg, bumex, entresto (non-formulary), statin  - gout: hold allopurinol, colchicine  - BPH: hold tamsulosin  - Hypomagnesemia: hold PO supplement, replace prn  - RA: continue prednisone, hold leflunamide    ____________________________________________________________________    Prophylaxis:   - DVT: held d/t INR   - GI: Not indicated  Analgesia: None  Nutrition: Diabetic and Cardiac  Therapy: OT and PT  Sedation: none  Last bowel movement: 3/20  Bowel regimen: senna, colace    Consults: urology, cardiology EPS, cardiology HF, wound care      Virl Cagey. Caron Presume, MD  Transitional Year PGY-1  Pager: [phone]  ------------------------------------------------------------------------------------------------------------------------------------------------  Mixed shock resolved. Could have component of cardiogenic/septic and endocrine. Wean hydrocortisone. Resume PTA bumex, volume optimization. AKI- Monitor UOP. Tolerating PO. Wound care. PT/OT.     I saw and examined the patient, personally performed the critical or key portions of the service and discussed patient management with the ICU team including the resident, fellow, nurse and ancillary staff. I reviewed the resident's note and agree with the history, findings and plan of care. Laboratory, hemodynamic, respiratory support, and radiological data were also reviewed and discussed. Exceptions are edited/noted above.    Critical Care time: 36 mins    Marcianna Daily Jacquelyne Balint, MD  Section of Pulmonary, Critical Care and Sleep Medicine  Department of Medicine, [facility]  11/15/2017, 14:33

## 2017-11-14 NOTE — Consults (Signed)
[place]  Initial    Delford, Wingert, 62 y.o. male  Date of Service: 11/14/2017  Date of Birth: [birth year]  LOS: 3 days  Requesting Provider/Service: MICU 2    Reason for Admission: Heart Failure  History of Present Illness:       Consult H and P    62 y/o man with below PMH hx came with septic shock management and on levophed from [facility]. Pt was diagnosed with possible urosepsis here. He stated that he was out on Friday 11/09/2017 and does not remember much then wife called EMS and he was taken to ED and was admitted [facility]  And got transferred here for higher level of care. His home meds include Bumex 2 mg BID at home which he did not get here due to being on levophed. Pt also had R leg cellulitis and getting treatment with abx here and also wound care is f/u with this. Pt also had TTE here which again showed LVEF of 15% with dilated IVC here. Pt was also found to have bladder dome lesion and urology will f/u as outpt.  Pt stated that he live with his wife and his wife help him for daily activities but he can walk around house without getting sob, he can drive himself and he can take some steps by himself. Pt is compliant with his medications and he is very much into with his medication. Pt is on warfarin for his AF management here and currently holding due to L psoas hematoma and supratheraputic INR          CAD hx:  October 2015 he had undergone surgical revascularization having received a LIMA sequenced to the diagonal and LAD and saphenous vein graft to the obtuse marginal and free radial to the PDA of the right coronary artery. Unfortunately, there is no meaningful improvement with regard to his left ventricular function. His last echocardiogram of January 2017 reported an ejection fraction of 15%. This was similar to a study performed in October 2016.        PMH:  Diagnosis   . Essential hypertension, benign   . Pure hypercholesterolemia   . Coronary atherosclerosis of  native coronary artery   . Splenomegaly   . Postsurgical percutaneous transluminal coronary angioplasty status   . Cardiomyopathy (Notable Code)   . Dyspnea   . Long term current use of anticoagulant therapy   . Fatigue   . Shortness of breath   . Edema   . Chronic a-fib (Notable Code)   . Ventricular tachycardia seen on cardiac monitor (Notable Code)   . Acute on chronic systolic heart failure (Notable Code)   . Morbid obesity with BMI of 40.0-44.9, adult (Notable Code)   . CABG x 4 --LIMA->D,LIMA->LAD, SVG->OM, RIMA->PDA on 06/15/14 by Dr.Navid   . AICD (automatic cardioverter/defibrillator) present BiV ICD  . Sleep apnea   . Bacteremia   . Osteomyelitis of ankle (Notable Code)   . Pulmonary HTN (Notable Code)   . Decubitus ulcer of right ankle, stage 4 (Notable Code)   . Primary cardiomyopathy (Notable Code)   . Type 2 diabetes mellitus without complication (Notable Code)   . Encounter for long-term (current) use of medications   . CHF exacerbation (Notable Code)   . Screening for thyroid disorder   . Screening, anemia, deficiency, iron   . Cardiac LV ejection fraction 15% as shown on Echocardiogram of 09-08-2015  Home Meds:  Medication Sig   . allopurinol (ZYLOPRIM) 300 mg oral tablet Take 300 mg by mouth daily   . aspirin (ECOTRIN) 325 mg oral delayed-release tablet Take 325 mg by mouth daily   . bumetanide (BUMEX) 2 mg oral tablet Take by mouth daily 1-2 mg As need   . carvedilol (COREG) 12.5 mg oral tablet Take 12.5 mg by mouth 2 times a day with meals   . colchicine (COLCRYS) 0.6 mg oral tablet Take 0.6 mg by mouth 3 times per week   . cyanocobalamin (VITAMIN B-12) 1,000 mcg oral tablet Take 1,000 micrograms by mouth daily   . digoxin (LANOXIN) 125 mcg oral tablet Take 125 micrograms by mouth daily   . ferrous gluconate 324 mg (37.5 mg iron) oral tablet Take 1 tablet by mouth daily   . folic acid (FOLVITE) 478 mcg oral tablet Take 800 micrograms by mouth daily   . gabapentin (NEURONTIN) 300 mg oral  capsule Take 300 mg by mouth Patient is currently taking 300 mg in AM and 600 mg in PM   . insulin detemir (LEVEMIR FLEXPEN) 100 unit/mL (3 mL) subcutaneous flextouch Inject into the skin every morning and at bedtime 24 units in AM and 52 in PM    . isosorbide mononitrate (IMDUR) 30 mg oral extended-release tablet Take 30 mg by mouth daily   . magnesium oxide (MAG-OX) 400 mg oral tablet Take 400 mg by mouth daily   . potassium bicarbonate (EFFER-K) 25 mEq oral disintegrating tablet Take 25 mEq by mouth as needed   . predniSONE (DELTASONE) 2.5 mg oral tablet Take 2.5 mg by mouth daily   . warfarin (COUMADIN) 5 mg oral tablet TAKE ONE TO TWO TABLETS BY MOUTH DAILY AS DIRECTED BY [facility] DAPS   . docusate sodium (COLACE) 100 mg oral capsule Take 100 mg by mouth daily   . Ferrous Fumarate (FERRETTS) 325 mg (106 mg iron) oral tablet Take 1 tablet by mouth daily   . insulin aspart (NOVOLOG FLEXPEN) 100 unit/mL subcutaneous flexpen Inject into the skin 2 times a day before meals Sliding scale   . leflunomide (ARAVA) 10 mg oral tablet Take 10 mg by mouth daily   . metOLazone (ZAROXOLYN) 2.5 mg oral tablet Take 2.5 mg by mouth daily As need per kidney MD   . polyethylene glycol (MIRALAX) 17 gram oral packet Take 17 g by mouth daily   . Saccharomyces boulardii (FLORASTOR) 250 mg oral capsule Take 250 mg by mouth daily   . sacubitril-valsartan (ENTRESTO) 24-26 mg oral tablet Take by mouth 2 times a day   . simvastatin (ZOCOR) 20 mg oral tablet Take 20 mg by mouth at bedtime   . tamsulosin (FLOMAX) 0.4 mg oral extended-release capsule Take 0.4 mg by mouth at bedtime           Dyspnea:  Admits:  With moderate exertion   Edema: Admits Lower Extremity Edema: bilaterally  knee(s)  1+   Orthostasis  denies    pillow orthopnea denies    Paroxysmal nocturnal dyspnea denies    right upper quadrant pain denies    abdominal bloating denies    early satiety denies    nausea denies    anorexia denies    syncope denies    presyncope denies       exertional lightheadedness denies    defibrillator firings denies    palpations denies    chest pain Denies   ethanol denies    tobacco denies  Substance use denies     Risk Factors  Family history of heart failure: Yes. mother  Family history of sudden cardiac death: no  Arrhythmia present?No  Renal Failure:Yes,       - Chronic Kidney Disease  (CKD) Stage 3   Pulmonary Hypertension: no  Anemia: Yes,   Acute  Anemia source: blood loss  Malnutrition: No, dietitian consult      Past Medical History:   Diagnosis Date   . A-fib (CMS HCC)     s/p ablation   . Anemia    . Anticoagulant long-term use     Warfarin   . Arthritis    . CAD (coronary artery disease)     s/p CABG   . Cardiac LV ejection fraction 21-30%    . Cardiomyopathy (CMS [place])     EF 25%   . Cellulitis of lower extremity    . CHF (congestive heart failure) (CMS HCC)    . CKD (chronic kidney disease), stage III (CMS HCC)    . Colonic polyp    . Constipation    . DM2 (diabetes mellitus, type 2) (CMS HCC)    . Edema     bilateral lower extremity   . Gout    . History of cellulitis    . HLD (hyperlipidemia)    . HTN (hypertension)    . Morbid obesity (CMS [place])    . Neuropathy (CMS HCC)     bilateral feet   . Osteomyelitis (CMS [place]) 2015    right ankle and was treated with external fixator for 4 months followed by multiple debridements   . Pacemaker    . Urinary retention    . Use of cane as ambulatory aid     states due to his right ankle- also has a walker and wheelchair           Past Surgical History:   Procedure Laterality Date   . HX CARDIAC ABLATION     . HX COLONOSCOPY     . HX CORONARY ARTERY BYPASS GRAFT     . HX PACEMAKER DEFIBRILLATOR PLACEMENT             Patient Active Problem List   Diagnosis   . UTI (urinary tract infection)   . Morbid obesity   . Septic shock (CMS HCC)       Prior to Admission Medications:  Medications Prior to Admission     Prescriptions    allopurinol (ZYLOPRIM) 300 mg Oral Tablet    Take 300 mg by mouth Once a day     aspirin 81 mg Oral Tablet, Chewable    Take 324 mg by mouth Once a day    bumetanide (BUMEX) 2 mg Oral Tablet    Take 2 mg by mouth Twice daily    carvedilol (COREG) 12.5 mg Oral Tablet    Take 12.5 mg by mouth Twice daily with food    colchicine 0.6 mg Oral Tablet    Take 0.6 mg by mouth Every Monday, Wednesday and Friday    digoxin (LANOXIN) 125 mcg Oral Tablet    Take 0.125 mg by mouth Once a day    gabapentin (NEURONTIN) 300 mg Oral Capsule    Take 300 mg by mouth Three times a day Take 600mg  in the morning, followed by 300mg  in the afternoon, then 600mg  in the evening.    Leflunomide (ARAVA) 20 mg Oral Tablet    Take 20 mg by mouth Once a day  magnesium oxide (MAG-OX) 400 mg (241.3 mg magnesium) Oral Tablet    Take 400 mg by mouth Once a day    metFORMIN (GLUCOPHAGE) 500 mg Oral Tablet    Take 500 mg by mouth Twice daily with food    pioglitazone (ACTOS) 15 mg Oral Tablet    Take 15 mg by mouth Once a day    predniSONE (DELTASONE) 2.5 mg Oral Tablet    Take 2.5 mg by mouth Once a day before lunch    sacubitril-valsartan (ENTRESTO) 49-51 mg Oral Tablet    Take 1 Tab by mouth Twice daily    simvastatin (ZOCOR) 20 mg Oral Tablet    Take 20 mg by mouth Every evening          Past Family History:   Family Medical History:     Problem Relation (Age of Onset)    Arthritis-rheumatoid Father, Brother    Congestive Heart Failure Mother    Coronary Artery Disease Father          Social History  Social History     Substance and Sexual Activity   Alcohol Use Yes   . Frequency: 2-4 times a month      reports that he quit smoking about 7 years ago. His smoking use included cigarettes. He has never used smokeless tobacco.   reports that he does not use drugs.    ROS: Other than ROS in the HPI, all other systems were negative.      Exam:   Temperature: 36.3 C (97.3 F)  Heart Rate: 79  BP (Non-Invasive): 98/61  Respiratory Rate: 20  SpO2: 95 %  Pain Score (Numeric, Faces): 0    Date 11/14/17 0700 - 11/15/17 0659   Shift  0700-1459 1500-2259 2300-0659 24 Hour Total   INTAKE   I.V.(mL/kg/hr) 13.86   13.86     Norepinephrine Volume 13.86   13.86   Shift Total(mL/kg) 13.86(0.09)   13.86(0.09)   OUTPUT   Urine(mL/kg/hr) 525   525   Shift Total(mL/kg) 525(3.24)   525(3.24)   NET -511.14   -511.14   Weight (kg) 162 162 162 162       Current Weight:   Patient Vitals for the past 48 hrs:   Weight   11/12/17 1455 (!) 162 kg (357 lb 2.3 oz)       General:NAD,appears chronically ill  HENT:NC/AT; oral mucous membranesmoist  Eyes:conjunctivaeclear, non-icteric; PERRLA, L lateral upper lid lesion  Neck:trachea midline  Cardiovascular:RRR; normal A2/Z3; holosystolic murmur  Pulmonary / Chest:Normal respiratory effort; CTAB but mild crackles at bilateral bases. NC in place  GI / Abdominal:Morbidly obese, soft, non-distended; BS normal; no TTP  Extremities:bilateral pittingedema;nocyanosis; well-perfused  Skin:Warm and dry, venous stasis dermatitis changes over bilateral legs with hyperpigmentation and some superficial fluid blisters, R>L; wound dressings in place  Neurologic:CN grossly intact. Mentation improved    LABS:  Results for orders placed or performed during the hospital encounter of 11/11/17 (from the past 24 hour(s))   URINALYSIS, MACROSCOPIC AND MICROSCOPIC W/CULTURE REFLEX    Narrative    The following orders were created for panel order URINALYSIS, MACROSCOPIC AND MICROSCOPIC W/CULTURE REFLEX.  Procedure                               Abnormality         Status                     ---------                               -----------         ------  URINALYSIS, MACROSCOPIC[[id number]]      Abnormal            Final result               URINALYSIS, MICROSCOPIC[[id number]]      Abnormal            Final result                 Please view results for these tests on the individual orders.   TROPONIN-I   Result Value Ref Range    TROPONIN I 17 0 - 30 ng/L   LIPASE   Result Value Ref Range    LIPASE 96 (H) 10 -  80 U/L   URINALYSIS, MACROSCOPIC   Result Value Ref Range    SPECIFIC GRAVITY 1.010 1.005 - 1.030    GLUCOSE Negative Negative mg/dL    PROTEIN 100  (A) Negative mg/dL    BILIRUBIN Negative Negative mg/dL    UROBILINOGEN 2.0  (A) Negative mg/dL    PH 5.0 5.0 - 8.0    BLOOD Moderate (A) Negative mg/dL    KETONES Negative Negative mg/dL    NITRITE Negative Negative    LEUKOCYTES Negative Negative WBCs/uL    APPEARANCE Cloudy (A) Clear    COLOR Normal (Yellow) Normal (Yellow)   URINALYSIS, MICROSCOPIC   Result Value Ref Range    WBCS 5-10 (A) 2-5, 0-2, None /hpf    RBCS 2-5 2-5, 0-2, None /hpf    BACTERIA Occasional or less Occasional or less /hpf    AMORPHOUS SEDIMENT Light Light /hpf    HYALINE CASTS 0-2 2-5, 0-2 /lpf    GRANULAR CASTS 20-50 (A) (none) /lpf    MUCOUS Light Light /lpf    Narrative    MICROSCOPIC EXAM PEFORMED ON UNCENTRIFUGED URINE. 1 ML OR LESS OF SAMPLE SUBMITTED.   PT/INR daily X 7   Result Value Ref Range    PROTHROMBIN TIME 46.8 (H) 9.5 - 14.1 seconds    INR 4.13 (H) 0.80 - 1.20    Narrative    Coumadin therapy INR range for Conventional Anticoagulation is 2.0 to 3.0 and for Intensive Anticoagulation 2.5 to 3.5.   CBC - DAY 3   Result Value Ref Range    WBC 13.8 (H) 3.5 - 11.0 x10^3/uL    RBC 3.55 (L) 4.06 - 5.63 x10^6/uL    HGB 11.0 (L) 12.5 - 16.3 g/dL    HCT 33.6 (L) 36.7 - 47.0 %    MCV 94.6 78.0 - 100.0 fL    MCH 31.1 27.4 - 33.0 pg    MCHC 32.9 32.5 - 35.8 g/dL    RDW 18.3 (H) 12.0 - 15.0 %    PLATELETS 86 (L) 140 - 450 x10^3/uL    MPV 9.3 7.5 - 11.5 fL   BASIC METABOLIC PANEL   Result Value Ref Range    SODIUM 132 (L) 136 - 145 mmol/L    POTASSIUM 4.3 3.5 - 5.1 mmol/L    CHLORIDE 101 96 - 111 mmol/L    CO2 TOTAL 17 (L) 22 - 32 mmol/L    ANION GAP 14 (H) 4 - 13 mmol/L    CALCIUM 8.6 8.5 - 10.2 mg/dL    GLUCOSE 282 (H) 65 - 139 mg/dL    BUN 71 (H) 8 - 25 mg/dL    CREATININE 1.78 (H) 0.62 - 1.27 mg/dL    BUN/CREA RATIO 40 (H) 6 - 22    ESTIMATED GFR 39 (L) >59 mL/min/1.72m^2  Narrative     Hemolysis can alter results at this level (slight).   MAGNESIUM   Result Value Ref Range    MAGNESIUM 2.6 (H) 1.6 - 2.5 mg/dL    Narrative    Hemolysis can alter results at this level (slight).   PHOSPHORUS   Result Value Ref Range    PHOSPHORUS 2.6 2.3 - 4.0 mg/dL    Narrative    Hemolysis can alter results at this level (slight).   TROPONIN-I   Result Value Ref Range    TROPONIN I 13 0 - 30 ng/L   POC BLOOD GLUCOSE (RESULTS)   Result Value Ref Range    GLUCOSE, POC 130 (H) 70 - 105 mg/dl   POC BLOOD GLUCOSE (RESULTS)   Result Value Ref Range    GLUCOSE, POC 282 (H) 70 - 105 mg/dl   POC BLOOD GLUCOSE (RESULTS)   Result Value Ref Range    GLUCOSE, POC 198 (H) 70 - 105 mg/dl     Lab Results   Component Value Date    CO2 17 (L) 11/14/2017    CO2 25 11/13/2017    BUN 71 (H) 11/14/2017    BUN 72 (H) 11/13/2017    CREATININE 1.78 (H) 11/14/2017    CREATININE 1.88 (H) 11/13/2017    WBC 13.8 (H) 11/14/2017    WBC 9.5 11/13/2017    HGB 11.0 (L) 11/14/2017    HGB 10.8 (L) 11/13/2017     Lab Results   Component Value Date    AST 36 11/11/2017    AST 35 11/11/2017    ALT 9 11/11/2017    ALT 10 11/11/2017     No results found for: TRIG, HDL  No results found for: TSH  INR (no units)   Date Value   11/14/2017 4.13 (H)   11/13/2017 3.78 (H)   11/12/2017 2.96 (H)         Additional lab testing ordered: none    Chest X-ray:   11/11/2017  IMPRESSION:  Cardiomegaly without acute process.    TTE 11/12/2017:  Findings:  Left Ventricle: Normal left ventricular size. Severely depressed left ventricular systolic function. LV Ejection Fraction  is 25 %. Normal geometry. Paradoxical septal motion consistent with RV pacemaker.  Resting Segmental Wall Motion Analysis: Total wall motion score is 2.24. There is akinesis of the mid to apical  anterolateral wall. There is akinesis of the apical cap. There is akinesis of the apical inferoseptal wall. The remaining  left ventricular segments demonstrate hypokinesis.  Right Ventricle: Mildly dilated  right ventricle. Mildly depressed right ventricular systolic function. Echo density in  right ventricle suggestive of catheter, pacer lead, or ICD lead.  Left Atrium: The left atrium is normal in size.  Right Atrium: Mildly dilated right atrium.  Atrial Septum: The interatrial septum is normal in appearance.  Mitral Valve: There is moderate secondary mitral regurgitation.  Aortic Valve: Normal aortic valve. No aortic regurgitation seen. Trileaflet aortic valve. No Aortic Valve Stenosis.  Tricuspid Valve: There is moderate tricuspid regurgitation.  Pulmonic Valve: Normal pulmonic valve.  Pericardium: Normal pericardium with no pericardial effusion.  Aorta: Normal aortic root.  IVC: The inferior vena cava is dilated.  Pulmonary Artery: Normal pulmonary artery size.      Heart Failure Therapies:   ARN1/ACE-I/ARB: No: Why? hypotension  Beta Blocker: No: Why? hypotension  Aldosterone antagonist: No: Why? hypotension  Device: Yes   CRT-D    Family Communication:   Code Status:  Code Status Information  Code Status    Full Code            Assessment/Recommendations:  Acute on Chronic Systolic CHF. Requiring Cardiogenic Shock: no / ischemic cardiomyopathy/ Mild right ventricular dysfunction / Clinically mildly fluid overloaded on examination/ ACC/AHA Stage D, NYHA functional classIII    Diagnosis   . Essential hypertension, benign   . Pure hypercholesterolemia   . Coronary atherosclerosis of native coronary artery   . Splenomegaly   . Postsurgical percutaneous transluminal coronary angioplasty status   . Cardiomyopathy (Notable Code)   . Dyspnea   . Long term current use of anticoagulant therapy   . Fatigue   . Shortness of breath   . Edema   . Chronic a-fib (Notable Code)   . Ventricular tachycardia seen on cardiac monitor (Notable Code)   . Acute on chronic systolic heart failure (Notable Code)   . Morbid obesity with BMI of 40.0-44.9, adult (Notable Code)   . CABG x 4 --LIMA->D,LIMA->LAD, SVG->OM, RIMA->PDA on  06/15/14 by Dr.Navid   . AICD (automatic cardioverter/defibrillator) present   . Sleep apnea   . Bacteremia   . Osteomyelitis of ankle (Notable Code)   . Pulmonary HTN (Notable Code)   . Decubitus ulcer of right ankle, stage 4 (Notable Code)   . Primary cardiomyopathy (Notable Code)   . Type 2 diabetes mellitus without complication (Notable Code)   . Encounter for long-term (current) use of medications   . CHF exacerbation (Notable Code)   . Screening for thyroid disorder   . Screening, anemia, deficiency, iron   . Cardiac LV ejection fraction 15% as shown on Echocardiogram of 09-08-2015    Urosepsis  Supratheraputic INR  AKI over CKD stage III  AF on warfarin  DM type II    - Recommended starting lasix 40 mg IV onetime dose and we will monitor the UOP here. Please do Strict I/O, daily weight, fluid restriction and 2 g sodium diet.  - Recommended Lymphedema consult here.  - Please do icd ( [employer]) interrogation here if not done.  - Pt is on Bumex 2mg  BID, Entresto 49-51 mg BID and Coreg 12.5 mg BID at home and we will recommend to add it later when pt is off from the drip and after our evaluation.     Please place consult order in [place] if not done yet.    Thank you for allowing [place] to participate in the care of your patient.  If you have any questions please do not hesitate to call.     Holley Bouche, MD    Late entry for 11/14/17. I saw and examined the patient.  I reviewed the fellow's note.  I agree with the findings and plan of care as documented in the fellow's note.  Any exceptions/additions are edited/noted.      Royston Cowper Kellar Westberg, DO [facility]

## 2017-11-14 NOTE — Care Plan (Signed)
[place]  Physical Therapy Initial Evaluation    Patient Name: Andrew Arias  Date of Birth: [birth year]  Height: Height: 193 cm (6' 3.98")  Weight: Weight: (!) 162 kg (357 lb 2.3 oz)  Room/Bed: 520/A  Payor: [place] / Plan: [place] [facility] / Product Type: MEDICARE [facility] /     Assessment:      Patient stood with mod assist x 2. Ambulated 3 feet forward and back to chair with [place] and min assist x 2. May be able to return to home with 24/7 and home health with acute progress.    Discharge Needs:    Equipment Recommendation: none anticipated    Discharge Disposition: inpatient rehabilitation facility    JUSTIFICATION OF DISCHARGE RECOMMENDATION   Based on current diagnosis, functional performance prior to admission, and current functional performance, this patient requires continued PT services in inpatient rehabilitation facility in order to achieve significant functional improvements in these deficit areas: muscle performance, gait, locomotion, and balance.        Plan:   Current Intervention: balance training, bed mobility training, gait training, transfer training, stair training  To provide physical therapy services minimum of 2x/week  for duration of until discharge.    The risks/benefits of therapy have been discussed with the patient/caregiver and he/she is in agreement with the established plan of care.       Subjective & Objective        11/14/17 2440   Therapist Pager   PT Assigned/ Pager # [phone]   Rehab Session   Document Type evaluation   Total PT Minutes: 28   Patient Effort good   Symptoms Noted During/After Treatment fatigue   General Information   Patient Profile Reviewed? yes   Pertinent History of Current Functional Problem Andrew Arias is a 62 y.o. male with CAD s/p CABG, CHF, DM2, A-fib s/p ablation on warfarin, paced with AICD, CKD III, and BPH who presents with septic shock likely from a UTI/cystitis.  Pt is oriented but lethargic and a poor  historian, so the majority of hx is obtained from records review as pt was transferred from [place] presented to [facility] yesterday d/t AMS, fatigue, and anorexia with decreased PO intake for 2-3 days prior.  Pt cannot recall events of going to the hospital or transfer here.  Denies hx of recent dysuria or polyuria, F/C.  ROS notable only for SOB, but pt states this is unchanged from baseline.  Appeared to have urinary retention with 300 cc on bladder scan and straight cath was difficult to pass, producing minimal prurulent fluid.  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  CT head for AMS was unremarkable.  Pt received one dose each of vancomycin and zosyn at the OSH and 2L fluid bolus.  He began having further confusion and was hypotensive requiring levophed support.  ECG at OSH also notable for possible pacemaker failure, but the pt had no cardiac symptoms.  He was transferred on 100 ml/hr mIVF but no total given was reported; central line placed prior to transfer.   Medical Lines Telemetry;PIV Line;Foley Catheter   Respiratory Status room air   Mutuality/Individual Preferences   Individualized Care Needs OOB with sar stedy   Living Environment   Lives With spouse   Living Arrangements house   Home Assessment: Stairs in [place] Accessibility stairs to enter home  Number of Stairs to Enter Home 2   Number of Stairs Within Home 12   Living Environment Comment Stays on one level. Does sponge bath. Has W/C, [employer], cane and BST.   Functional Level Prior   Ambulation 1 - assistive equipment   Transferring 1 - assistive equipment   Toileting 1 - assistive equipment   Bathing 2 - assistive person   Dressing 0 - independent   Eating 0 - independent   Pre Treatment Status   Pre Treatment Patient Status Patient sitting in bedside chair or w/c;Call light within reach;Telephone within reach;Nurse approved session   Support Present Pre  Treatment  Nurse present   Communication Pre Treatment  Nurse   Cognitive Assessment/Interventions   Behavior/Mood Observations behavior appropriate to situation, WNL/WFL   Orientation Status oriented x 4   Attention WNL/WFL   Follows Commands WNL   Pain Assessment   Pre/Post Treatment Pain Comment C/O RLE pain.   RLE Assessment   RLE Assessment X-Exceptions   RLE Strength grossly 3/5   LLE Assessment   LLE Assessment X-Exceptions   LLE Strength grossly 3/5   Transfer Assessment/Treatment   Sit-Stand Independence moderate assist (50% patient effort);2 person assist required   Stand-Sit Independence moderate assist (50% patient effort);2 person assist required   Gait Assessment/Treatment   Independence  minimum assist (75% patient effort);2 person assist required   Assistive Device  walker, front wheeled   Distance in Feet 3   Balance Skill Training   Sitting Balance: Static fair + balance   Sitting, Dynamic (Balance) fair balance   Sit-to-Stand Balance fair balance   Standing Balance: Static fair balance   Standing Balance: Dynamic fair - balance   Post Treatment Status   Post Treatment Patient Status Patient sitting in bedside chair or w/c;Call light within reach;Telephone within reach   Support Present Post Treatment  None   Communication Post Treatement Nurse   Plan of Care Review   Plan Of Care Reviewed With patient   Physical Therapy Clinical Impression   Assessment Patient stood with mod assist x 2. Ambulated 3 feet forward and back to chair with [place] and min assist x 2. May be able to return to home with 24/7 and home health with acute progress.   Criteria for Skilled Therapeutic yes;meets criteria   Pathology/Pathophysiology Noted musculoskeletal   Impairments Found (describe specific impairments) muscle performance;gait, locomotion, and balance   Functional Limitations in Following  community/leisure   Disability: Inability to Perform community/leisure   Rehab Potential good, to achieve stated therapy goals      Therapy Frequency minimum of 2x/week   Predicted Duration of Therapy Intervention (days/wks) until discharge   Anticipated Equipment Needs at Discharge (PT) none anticipated   Anticipated Discharge Disposition inpatient rehabilitation facility   Evaluation Complexity Justification   Patient History: Co-morbidity/factors that impact Plan of Care 3 or more that impact Plan of Care   Examination Components 3 or more Exam elements addressed   Presentation Evolving: Symptoms, complaints, characteristics of condition changing &/or cognitive deficits present   Clinical Decision Making Moderate complexity   Evaluation Complexity Moderate complexity   Care Plan Goals   PT Rehab Goals Gait Training Goal;Stairs Training Goal   Gait Training  Goal, Distance to Achieve   Gait Training  Goal, Date Established 11/14/17   Gait Training  Goal, Time to Achieve by discharge   Gait Training  Goal, Independence Level contact guard assist   Gait Training  Goal, Assist Device walker,  rolling   Gait Training  Goal, Distance to Achieve 100   Stairs Training Goal   Stairs Training Goal, Date Established 11/14/17   Stairs Training Goal, Time to Achieve by discharge   Stairs Training Goal, Independence Level contact guard assist   Stairs Training Goal, Assist Device walker, rolling   Stairs Training Goal, Number of Stairs to Achieve 2  (curb type steps)   Planned Therapy Interventions, PT Eval   Planned Therapy Interventions (PT) balance training;bed mobility training;gait training;transfer training;stair training       Therapist:   Carollee Herter, PT   Pager #: [phone]

## 2017-11-14 NOTE — Care Plan (Signed)
[place]  Occupational Therapy Initial Evaluation    Patient Name: Andrew Arias  Date of Birth: 22-Oct-1955  Height: Height: 193 cm (6' 3.98")  Weight: Weight: (!) 162 kg (357 lb 2.3 oz)  Room/Bed: 520/A  Payor: [place] / Plan: [place] [facility] / Product Type: MEDICARE [facility] /     Assessment:   Mr Duva tolerated OT evaluation well. Pt requiring assist x1-2 for most ADLs, ADL transfers, & short distance fxnl mobility with use of FWW. Pt limited with fxnl activity d/t increased pain, decreased strength, endurance, balance, & generalized deconditioning. Pt would greatly benefit from continued skilled OT intervention in this setting and acute rehab to maximize pt (I) with ADLs & fxnl activity. Will update recommendations as pt progresses. Pt has good rehab potential to return home with 24/7 assist & [facility] OT pending pt progress.       Discharge Needs:   Equipment Recommendation: bariatric, tub bench    The patient presents with mobility limitations due to impaired balance, impaired strength and impaired functional activity tolerance that significantly impair/prevent patient's ability to participate in mobility-related activities of daily living (MRADLs) including  bathing. This functional mobility deficit can be sufficiently resolved with the use of a bariatric, tub bench in order to decrease the risk of falls, morbidity, and mortality in performance of these MRADLs.  Patient is able to safely use this assistive device.    Discharge Disposition: inpatient rehabilitation facility  The above recommendation is based upon the current examination and evaluation performed on this date. As subsequent sessions are completed, recommendations will be updated accordingly.    JUSTIFICATION OF DISCHARGE RECOMMENDATION   Based on current diagnosis, functional performance prior to admission, and current functional performance, this patient requires continued OT services in inpatient  rehabilitation facility  in order to achieve significant functional improvements.    Plan:   Current Intervention: ADL retraining, balance training, bed mobility training, endurance training, strengthening, transfer training    To provide Occupational therapy services 1x/day, minimum of 2x/week, until discharge, until goals are met.       The risks/benefits of therapy have been discussed with the patient/caregiver and he/she is in agreement with the established plan of care.       Subjective & Objective        11/14/17 0810   Therapist Pager   OT Assigned/ Pager # Sallie Maker [phone]   Rehab Session   Document Type evaluation   Total OT Minutes: 26   Patient Effort good   Symptoms Noted During/After Treatment fatigue   General Information   Patient Profile Reviewed? yes   Pertinent History of Current Functional Problem Shant Hence is a 62 y.o. male with CAD s/p CABG, CHF, DM2, A-fib s/p ablation on warfarin, paced with AICD, CKD III, and BPH who presents with septic shock likely from a UTI/cystitis.  Pt is oriented but lethargic and a poor historian, so the majority of hx is obtained from records review as pt was transferred from [place] presented to [facility] yesterday d/t AMS, fatigue, and anorexia with decreased PO intake for 2-3 days prior.  Pt cannot recall events of going to the hospital or transfer here.  Denies hx of recent dysuria or polyuria, F/C.  ROS notable only for SOB, but pt states this is unchanged from baseline.  Appeared to have urinary retention with 300 cc on bladder scan and straight cath was difficult to pass, producing minimal prurulent  fluid.  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  CT head for AMS was unremarkable.  Pt received one dose each of vancomycin and zosyn at the OSH and 2L fluid bolus.  He began having further confusion and was hypotensive requiring levophed support.  ECG at OSH also notable for  possible pacemaker failure, but the pt had no cardiac symptoms.  He was transferred on 100 ml/hr mIVF but no total given was reported; central line placed prior to transfer.    Medical Lines Telemetry;PIV Line;Foley Catheter   Respiratory Status room air   Existing Precautions/Restrictions full code;fall precautions   Pre Treatment Status   Pre Treatment Patient Status Patient sitting in bedside chair or w/c;Call light within reach;Telephone within reach;Nurse approved session   Support Present Pre Treatment  Nurse present   Communication Pre Treatment  Nurse   Communication Pre Treatment Comment Pt/RN agreeable to OT consult, seen with professional assist of PT   Mutuality/Individual Preferences   Individualized Care Needs OOB sara stedy A x2    Living Environment   Lives With spouse   Living Arrangements house   Home Assessment: Stairs in [place] Accessibility stairs within home;stairs to enter home   Number of Stairs to [place] 2   Number of Stairs Within [place] Pt reports living in a multi level home with his spouse. Pt stays on the 1st level.    Functional Level Prior   Ambulation 1 - assistive equipment   Transferring 1 - assistive equipment   Toileting 1 - assistive equipment   Bathing 2 - assistive person   Dressing 0 - independent   Eating 0 - independent   Prior Functional Level Comment Pt reports he was typically (MI) with ADLs. Pt uses cane for household mobility. Pt wife assists with sponge bathing. Pt completes grooming tasks seated. Pt wife completes IADLs. Pt reported his wife does not get around well d/t arthritis. Pt uses BSC over commode for toileting. Pt owns a lift chair.    Self-Care   Current Activity Tolerance fair   Equipment Currently Used at Home yes   Equipment Currently Used at [employer], Careers information officer, rolling;wheelchair  (lift chair)   Pain Assessment   Pre/Post Treatment Pain Comment c/o RLE pain did not formally rate       Coping/Psychosocial Response Interventions   Plan Of Care Reviewed With patient   Cognitive Assessment/Interventions   Behavior/Mood Observations behavior appropriate to situation, WNL/WFL;alert;cooperative   Orientation Status oriented x 4   Attention WNL/WFL   Follows Commands WNL   RUE Assessment   RUE Assessment X- Exceptions   RUE ROM grossly WFL    RUE Strength gross weakness observed   LUE Assessment   LUE Assessment X-Exceptions   LUE ROM grossly WFL    LUE Strength gross weakness observed   Mobility Assessment/Training   Mobility Comment fxnl mobility ~9f x2 with FWW & CGA x2    Bed Mobility Assessment/Treatment   Supine-Sit Independence not tested   Sit to Supine, Independence not tested   Transfer Assessment/Treatment   Sit-Stand Independence moderate assist (50% patient effort);2 person assist required;verbal cues required   Stand-Sit Independence moderate assist (50% patient effort);2 person assist required;verbal cues required   Sit-Stand-Sit, Assist Device walker, front wheeled   Transfer Impairments endurance;pain;strength decreased;postural control impaired   Bathing Assessment/Training   Comment Min A to donn hospital gown  Lower Body Dressing Assessment/Training   Comment doff/donn L sock, R sock not attempted 2/2 pain   Toileting Assessment/Training   Comment foley in place    Grooming Assessment/Training   Comment pt demonstrated WFL ROM for seated grooming ADLs   Self-Feeding Assessment/Training   Comment pt reported no concerns   Balance Skill Training   Comment use of FWW   Sitting Balance: Static fair + balance   Sitting, Dynamic (Balance) fair balance   Sit-to-Stand Balance fair balance   Standing Balance: Static fair balance   Standing Balance: Dynamic fair - balance   Identified Impairments Contributing to Balance Disturbance decreased strength;pain;decreased sensation  (endurance)   Therapeutic Exercise/Activity   Comment decreased fxnl activity tolerance    Post Treatment Status    Post Treatment Patient Status Patient sitting in bedside chair or w/c;Call light within reach;Telephone within reach   Support Present Post Treatment  None   Communication Post Treatement Nurse   Care Plan Goals   OT Rehab Goals Bed Mobility Goal;Grooming Goal;LB Dressing Goal;Toileting Goal;Transfer Training Goal;UB Dressing Goal   Bed Mobility Goal   Bed Mobility Goal, Date Established 11/14/17   Bed Mobility Goal, Time to Achieve by discharge   Bed Mobility Goal, Activity Type all bed mobility activities   Bed Mobility Goal, Independence Level modified independence   Grooming Goal   Grooming Goal, Date Established 11/14/17   Grooming Goal, Time to Achieve by discharge   Grooming Goal, Activity Type all grooming tasks   Grooming Goal, Independence  modified independence   Grooming Goal, Position sitting in chair   LB Dressing Goal   LB Dressing Goal, Date Established 11/14/17   LB Dressing Goal, Time to Achieve by discharge   LB Dressing Goal, Activity Type all lower body dressing tasks   LB Dressing Goal, Independence Level modified independence   Toileting Goal   Toileting Goal, Date Established 11/14/17   Toileting Goal, Time to Achieve by discharge   Toileting Goal, Activity Type all toileting tasks   Toileting Goal, Independence Level modified independence   Transfer Training Goal   Transfer Training Goal, Date Established 11/14/17   Transfer Training Goal, Time to Achieve by discharge   Transfer Training Goal, Activity Type bed-to-chair/chair-to-bed;sit-to-stand/stand-to-sit  (BSC over toilet)   Transfer Training Goal, Independence Level modified independence   Transfer Training Goal, Assist Device least restrictrictive assistive device   UB Dressing Goal   UB Dressing  Goal, Date Established 11/14/17   UB Dressing Goal, Time to Achieve by discharge   UB Dressing Goal, Activity Type all upper body dressing tasks   UB Dressing Goal, Independence Level modified independence   Planned Therapy Interventions,  OT Eval   Planned Therapy Interventions ADL retraining;balance training;bed mobility training;endurance training;strengthening;transfer training   Occupational Therapy Clinical Impression   Functional Level at Time of Session Mr Butrick tolerated OT evaluation well. Pt requiring assist x1-2 for most ADLs, ADL transfers, & short distance fxnl mobility with use of FWW. Pt limited with fxnl activity d/t increased pain, decreased strength, endurance, balance, & generalized deconditioning. Pt would greatly benefit from continued skilled OT intervention in this setting and acute rehab to maximize pt (I) with ADLs & fxnl activity. Will update recommendations as pt progresses. Pt has good rehab potential to return home with 24/7 assist & [facility] OT pending pt progress.    Criteria for Skilled Therapeutic Interventions Met (OT) yes;meets criteria;skilled treatment is necessary   Rehab Potential good, to achieve stated therapy goals  Therapy Frequency 1x/day;minimum of 2x/week   Predicted Duration of Therapy until discharge;until goals are met   Anticipated Equipment Needs at Discharge bariatric;tub bench   Anticipated Discharge Disposition inpatient rehabilitation facility   Evaluation Complexity Justification   Occupational Profile Review Expanded review   Performance Deficits 3-5 deficits;Strength;Endurance;Sensation;Balance;Mobility;Pain  (ADLs, MRADLs)   Clinical Decision Making Moderate analytic complexity   Evaluation Complexity Moderate       Therapist:   Sabino Snipes, MOT, OTR/L    Pager #: [phone]

## 2017-11-14 NOTE — Pharmacist Med Reconciliation (Signed)
Pharmacy Medication Reconciliation    Patient Name: Andrew Arias, Andrew Arias  Date of Service: 11/12/2017  Date of Admission: 11/11/2017  Date of Birth: 1956/06/21  Length of Stay:   1 day       Clarified Prior to Admission Medications:  Prior to Admission medications    Medication Sig Start Date End Date Taking Resumed Y/N Authorizing Provider Comments   allopurinol (ZYLOPRIM) 300 mg Oral Tablet Take 300 mg by mouth Once a day   Yes   Provider, Historical     aspirin 81 mg Oral Tablet, Chewable Take 324 mg by mouth Once a day   Yes   Provider, Historical     bumetanide (BUMEX) 2 mg Oral Tablet Take 2 mg by mouth Twice daily   Yes   Provider, Historical     carvedilol (COREG) 12.5 mg Oral Tablet Take 12.5 mg by mouth Twice daily with food   Yes   Provider, Historical     colchicine 0.6 mg Oral Tablet Take 0.6 mg by mouth Every Monday, Wednesday and Friday   Yes   Provider, Historical     digoxin (LANOXIN) 125 mcg Oral Tablet Take 0.125 mg by mouth Once a day   Yes   Provider, Historical     gabapentin (NEURONTIN) 300 mg Oral Capsule Take 300 mg by mouth Three times a day Take 600mg  in the morning, followed by 300mg  in the afternoon, then 600mg  in the evening.   Yes   Provider, Historical  *Note dose change   Leflunomide (ARAVA) 20 mg Oral Tablet Take 20 mg by mouth Once a day   Yes   Provider, Historical     magnesium oxide (MAG-OX) 400 mg (241.3 mg magnesium) Oral Tablet Take 400 mg by mouth Once a day   Yes   Provider, Historical     metFORMIN (GLUCOPHAGE) 500 mg Oral Tablet Take 500 mg by mouth Twice daily with food   Yes   Provider, Historical     pioglitazone (ACTOS) 15 mg Oral Tablet Take 15 mg by mouth Once a day   Yes   Provider, Historical     predniSONE (DELTASONE) 2.5 mg Oral Tablet Take 2.5 mg by mouth Once a day before lunch   Yes   Provider, Historical  *Med added after communication with pharmacy. Patient has filled this prescription consistently over the past 4 months   sacubitril-valsartan (ENTRESTO) 49-51 mg  Oral Tablet Take 1 Tab by mouth Twice daily   Yes   Provider, Historical  * Med added to profile after discussion with pharmacy   simvastatin (ZOCOR) 20 mg Oral Tablet Take 20 mg by mouth Every evening   Yes   Provider, Historical           Information was collected from:  Pharmacy    Patient's understanding of medications:  Was not able to assess patients comprehension or understanding of medications  Summary:    Prior to Admission Medications Being Held and Rationale:  Allopurinol, aspirin, bumetanide, coreg, colchicine, gabapentin, entresto, simvastatin, prednisone, pioglitazone, leflunomide, metformin     Other Medication Discrepancies from Home Medication List:  Patient was prescribed warfarin 5mg  and instructed to take 1 to 2 tablets per day (#180). This medication was last filled 05/12/17- if patient was taking 1 tab daily the patient would have enough to get through mid-March until refill was needed.     Pharmacist Recommendations:   Continue current treatment regimen    Acquanetta Belling, PHARMD

## 2017-11-14 NOTE — Progress Notes (Signed)
MICU Progress Note     Name: Andrew Arias, Andrew Arias, 62 y.o. male Date of Service:  11/14/2017   Date of Birth:  04-09-1956 Date of Admission:  11/11/2017   PCP: No Pcp Length of Stay:  LOS: 3 days    MRN: [id number] Attending: Zachery Conch, MD   Code Status: Full Code Admitted for: Septic shock (CMS [place])     Subjective / Interval Events:   No acute events overnight.  Continued soft MAPs requiring very low dose levophed.  Discussed probable exacerbated HF with patient and plan to consult the HF service today      Objective:     VITAL SIGNS:  Temperature: 36.5 C (97.7 F) BP (Non-Invasive): 106/73 Heart Rate: 77   Respiratory Rate: 20 SpO2: (!) 75 % Weight: (!) 162 kg (357 lb 2.3 oz)        INTAKE & OUTPUT:    Intake/Output Summary (Last 24 hours) at 11/14/2017 1713  Last data filed at 11/14/2017 1600  Gross per 24 hour   Intake 352.59 ml   Output 1775 ml   Net -1422.41 ml     Last Bowel Movement: 11/14/17     HEMODYNAMICS:        VENTILATOR SETTINGS:  Not on Ventilator     WEANING PARAMETERS:   N/A     PHYSICAL EXAMINATION:  General: NAD, appears chronically ill  HENT: NC/AT; oral mucous membranes moist  Eyes: conjunctivae clear, non-icteric; PERRLA, L lateral upper lid lesion  Neck: trachea midline  Cardiovascular: RRR; normal C5/E5; holosystolic murmur  Pulmonary / Chest: Normal respiratory effort; CTAB but mild crackles at bilateral bases.  NC in place  GI / Abdominal: Morbidly obese, soft, non-distended; BS normal; no TTP  Extremities: bilateral pitting edema; no cyanosis; well-perfused  Skin: Warm and dry, venous stasis dermatitis changes over bilateral legs with hyperpigmentation and some superficial fluid blisters, R>L; wound dressings in place  Neurologic: CN grossly intact. Mentation improved     LINES / TUBES / DRAINS:   - central line L arm  - PIV  - foley     Labs:     I have reviewed all labs    Inpatient Medications:    I have reviewed all medications    Imaging / Studies:    I have reviewed all imaging  studies    Microbiology:    - blood Cx 3/17: ngtd  - blood Cx 3/18: ngtd  - urine Cx 3/17: ngtd  - MRSA negative  - C diff negative    Antimicrobials Start date End date   1. cefepime 3/17 3/19   2. linezolid 3/17 3/19   3. rocephin 3/19 3/23   4.      5.          Medical Decision Making:     Assessment / Plan: Andrew Arias is a 62 y.o. male here for Septic shock (CMS [place])    Shock, likely combined septic and cardiogenic 2/2 UTI/pyelonephritis vs LL cellulitis:   Status/DDx: Stable, but still requiring levophed.  Mentation greatly improved  Workup:   - blood, urine Cx negative  - urology consulted, appreciate recs  - RUQ to r/o cholecystitis  Tx:   - s/p linezolid, cefepime; continue rocephin x 7 days   - levophed, wean as tolerated  - hydrocortisone 50 q6hr, wean once hemodynamically stable     HFrEF  A-fib on warfarin  supratherapeutic INR  cardiogenic shock:   Status/DDx: No baseline O2 requirement.  TTE results and decreased UOP off low dose levophed are concerning for possible cardiorenal syndrome  Workup:   - cardiology EPS consulted; pacer functioning correctly  - TTE; EF 25% with multiple regions of marked akinesis/hypokinesis  - cardiology HF consulted; appreciate recs  Tx:   - tele  - monitor INR  - hold warfarin  - continue digoxin  - lasix 40 IV x1, monitor UOP  - fluid restriction 2L, daily weight    Bladder dome lesion:  - CT renal calculus protocol and cystogram reviewed  - urology consulted   - s/p uretheral stricture dilation   - no additional imaging requiring   - pt will need f/u cystoscopy outpatient; urology has referred   - pt to be d/c with foley    AKI on CKD III (improving):  - likely 2/2 septic shock; per report, baseline Cr ~1.6; was 3.6 at OSH  - urine lytes suggest pre-renal etioogy  - strict I/Os    Probable left psoas hematoma:  - noted on CT 3/18, asymptomatic  - no hx recent trauma; likely 2/2 supratherapeutic INR    Lymphedema  lower extremity wounds:  - wound care and  lymphedema OT consulted; appreciate recs    DM2, uncontrolled:  - A1c 8.2  - hold home PO agents  - SSI  - increase lantus to 20 from 10 u qhs  - holding gabapentin given AMS on presentation and AKI      Chronic medical problems:  - CAD s/p CABG  HTN  HLD: hold coreg, bumex, entresto (non-formulary), statin  - gout: hold allopurinol, colchicine  - BPH: hold tamsulosin  - Hypomagnesemia: hold PO supplement, replace prn  - RA: continue prednisone, hold leflunamide    ____________________________________________________________________    Prophylaxis:   - DVT: held d/t INR   - GI: Not indicated  Analgesia: None  Nutrition: Diabetic and Cardiac  Therapy: OT and PT  Sedation: none  Last bowel movement: 3/19  Bowel regimen: senna, colace    Consults: urology, cardiology EPS, cardiology HF, wound care      Andrew Cagey. Caron Presume, MD  Transitional Year PGY-1  Pager: [phone]    -----------------------------------------------------------------------------------------------------------------------------------------------------  Shock- likely mixed... Septic (pyelonephritis and/or cellulitis) + cardiogenic component. Stress dose steroids to prevent secondary adrenal insufficiency. Abx de-escalated as above. Weaning pressors as tolerated by renal function/ mental status and hemodynamics. Advanced HF consult noted. Supra-therapeutic INR- warfarin on hold. Trend INR.     I saw and examined the patient, personally performed the critical or key portions of the service and discussed patient management with the ICU team including the resident, fellow, nurse and ancillary staff. I reviewed the resident's note and agree with the history, findings and plan of care. Laboratory, hemodynamic, respiratory support, and radiological data were also reviewed and discussed. Exceptions are edited/noted above.    Critical Care time: 45 mins    Andrew Schappert Jacquelyne Balint, MD  Section of Pulmonary, Critical Care and Sleep Medicine  Department of Medicine, [facility]  11/14/2017 17:35

## 2017-11-15 LAB — CBC WITH DIFF
BASOPHIL #: 0.02 x10ˆ3/uL (ref 0.00–0.20)
BASOPHIL %: 0 %
EOSINOPHIL #: 0.01 x10ˆ3/uL (ref 0.00–0.50)
EOSINOPHIL %: 0 %
EOSINOPHIL %: 0 %
HCT: 30.4 % — ABNORMAL LOW (ref 36.7–47.0)
HGB: 10.2 g/dL — ABNORMAL LOW (ref 12.5–16.3)
LYMPHOCYTE #: 0.2 x10ˆ3/uL — ABNORMAL LOW (ref 1.00–4.80)
LYMPHOCYTE %: 4 %
MCH: 31.8 pg (ref 27.4–33.0)
MCHC: 33.6 g/dL (ref 32.5–35.8)
MCV: 94.8 fL (ref 78.0–100.0)
MONOCYTE #: 0.44 x10ˆ3/uL (ref 0.30–1.00)
MONOCYTE %: 8 %
MONOCYTE %: 8 %
MPV: 9.3 fL (ref 7.5–11.5)
NEUTROPHIL #: 5.13 x10ˆ3/uL (ref 1.50–7.70)
NEUTROPHIL %: 89 %
PLATELETS: 79 10*3/uL — ABNORMAL LOW (ref 140–450)
PLATELETS: 79 x10ˆ3/uL — ABNORMAL LOW (ref 140–450)
RDW: 18.6 % — ABNORMAL HIGH (ref 12.0–15.0)
WBC: 5.8 x10ˆ3/uL (ref 3.5–11.0)

## 2017-11-15 LAB — POC BLOOD GLUCOSE (RESULTS)
GLUCOSE, POC: 245 mg/dl — ABNORMAL HIGH (ref 70–105)
GLUCOSE, POC: 243 mg/dl — ABNORMAL HIGH (ref 70–105)
GLUCOSE, POC: 228 mg/dl — ABNORMAL HIGH (ref 70–105)

## 2017-11-15 LAB — BASIC METABOLIC PANEL
ANION GAP: 9 mmol/L (ref 4–13)
BUN/CREA RATIO: 47 — ABNORMAL HIGH (ref 6–22)
BUN: 66 mg/dL — ABNORMAL HIGH (ref 8–25)
CALCIUM: 8.3 mg/dL — ABNORMAL LOW (ref 8.5–10.2)
CALCIUM: 8.3 mg/dL — ABNORMAL LOW (ref 8.5–10.2)
CHLORIDE: 102 mmol/L (ref 96–111)
CHLORIDE: 102 mmol/L (ref 96–111)
CO2 TOTAL: 25 mmol/L (ref 22–32)
GLUCOSE: 213 mg/dL — ABNORMAL HIGH (ref 65–139)
POTASSIUM: 3.9 mmol/L (ref 3.5–5.1)
SODIUM: 136 mmol/L (ref 136–145)

## 2017-11-15 LAB — PT/INR
INR: 4.03 — ABNORMAL HIGH (ref 0.80–1.20)
PROTHROMBIN TIME: 45.7 seconds — ABNORMAL HIGH (ref 9.5–14.1)

## 2017-11-15 LAB — PHOSPHORUS: PHOSPHORUS: 2.6 mg/dL (ref 2.3–4.0)

## 2017-11-15 LAB — MAGNESIUM: MAGNESIUM: 2.4 mg/dL (ref 1.6–2.5)

## 2017-11-15 MED ORDER — INSULIN GLARGINE (U-100) 100 UNIT/ML SUBCUTANEOUS SOLUTION
25.0000 [IU] | Freq: Every day | SUBCUTANEOUS | Status: DC
Start: 2017-11-15 — End: 2017-11-17
  Administered 2017-11-15 – 2017-11-16 (×2): 25 [IU] via SUBCUTANEOUS
  Filled 2017-11-15 (×2): qty 25

## 2017-11-15 MED ORDER — HYDROCORTISONE SOD SUCCINATE 100 MG/2 ML VIAL WRAPPER
50.00 mg | Freq: Two times a day (BID) | INTRAMUSCULAR | Status: AC
Start: 2017-11-15 — End: 2017-11-15
  Administered 2017-11-15: 50 mg via INTRAVENOUS
  Filled 2017-11-15: qty 2

## 2017-11-15 MED ORDER — PREDNISONE 2.5 MG TABLET
2.50 mg | ORAL_TABLET | Freq: Every morning | ORAL | Status: DC
Start: 2017-11-16 — End: 2017-11-22
  Administered 2017-11-16 – 2017-11-22 (×7): 2.5 mg via ORAL
  Filled 2017-11-15 (×7): qty 1

## 2017-11-15 MED ORDER — NYSTATIN 100,000 UNIT/ML ORAL SUSPENSION
5.0000 mL | Freq: Four times a day (QID) | ORAL | Status: DC
Start: 2017-11-15 — End: 2017-11-22
  Administered 2017-11-15 – 2017-11-18 (×13): 5 mL via ORAL
  Administered 2017-11-19 – 2017-11-21 (×10): 0 mL via ORAL
  Administered 2017-11-22: 5 mL via ORAL
  Filled 2017-11-15 (×30): qty 5

## 2017-11-15 MED ORDER — BUMETANIDE 1 MG TABLET
2.00 mg | ORAL_TABLET | Freq: Two times a day (BID) | ORAL | Status: DC
Start: 2017-11-15 — End: 2017-11-16
  Administered 2017-11-15 – 2017-11-16 (×3): 2 mg via ORAL
  Filled 2017-11-15 (×4): qty 2

## 2017-11-15 MED ADMIN — nystatin 100,000 unit/gram topical powder: TOPICAL | @ 22:00:00

## 2017-11-15 MED ADMIN — famotidine (PF) 20 mg/2 mL intravenous solution: SUBCUTANEOUS | @ 17:00:00

## 2017-11-15 MED ADMIN — heparin lock flush (porcine) 10 unit/mL intravenous solution: @ 06:00:00

## 2017-11-15 NOTE — Nurses Notes (Signed)
Notified MD Baxter Flattery pt reports he use to wear bipap at night at home until his machine broke. Pt stated his insurance would not cover a replacement and therefore he was not able to purchase another one.

## 2017-11-15 NOTE — H&P (Signed)
INTERNAL MEDICINE  History & Physical     Name: Andrew Arias, Andrew Arias, 62 y.o. male Date of Service:  11/15/2017   Date of Birth:  04/07/1956 Date of Admission:  11/11/2017   PCP: No Pcp Attending: Zachery Conch, MD   Room: 520/A      Code Status: Full Code Chief Complaint: had no chief complaint listed for this encounter.  Admitted for: Septic shock (CMS [place])     HISTORY OF PRESENTING ILLNESS:      Andrew Arias is a 62 y.o. male with past medical history of CAD status post CABG, CHF, uncontrolled DM 2, atrial fibrillation status post ablation on warfarin for anticoagulation, paced with AICD, CKD 3, gout, and RA who presented originally from [facility] for septic shock, now as a MICU transfer to the medicine service. Mr Rodeheaver presented to [facility] on 11/11/2017 with several days of fatigue, minimal p.o. Intake, and altered mental status.  At outside facility, patient appeared to have urinary retention with inability to be straight cathed, however was producing minimal purulent fluid.  CT abdomen demonstrated bladder dome lesion concerning for bladder diverticulum.  Patient was treated with vancomycin and Zosyn as well as 2 L fluid bolus; however, patient continued to have worsening confusion hypotension requiring Levophed pressure support.  At that time, he was transferred to [facility] for further evaluation and treatment.  Patient was treated for shock and infectious workup was generally unremarkable for obvious source of infection with blood cultures and urine cultures remain negative.  However, renal ultrasound demonstrated perinephric stranding concerning for underlying pyelonephritis.  Patient was initiated on stress dose steroids and broad-spectrum antibiotics, now de-escalated to Rocephin alone.  Urology was consulted, the patient required urethral dilation for Foley placement, which was most likely contribution to his urinary retention.  CT renal calculus and cystogram d will perform to fully characterize the  bladder lesion; however, per Urology, no additional imaging is required however the patient will need cystogram on outpatient basis and is to be discharged with Foley in place.  Patient was noted to have elevated creatinine of 3.6 on arrival, with baseline of 1.6 per outside records, most likely representing AKI on CKD 3 secondary to ATN in setting of sepsis.  Additionally, patient has history of chronic lower extremity wounds secondary to lymphedema with possible cellulitis considered as a possible infectious source, and wound care was consulted.  ECG on admission was notable for pacemaker, therefore Cardiology EPS interrogated pacemaker which was found to be functioning properly.  TTE demonstrated EF of 25% with multiple regions of akinesis/hypokinesis.  No echo records were available for comparison, however patient follows in Cardiology with Dr. Jerral Bonito in [place].  The Cardiology Heart failure service was consulted and recommended optimizing diuresis with resolution of AKI.  At time of transfer, patient's home Bumex was re-initiated while Coreg and Delene Loll continues to be held due to patient hypotension.  Patient had a supratherapeutic INR after receiving 1 dose of home warfarin; therefore, his warfarin was held and INR is now down trending.  Of note, the patient also had a left psoas hematoma noted on imaging most likely secondary to supratherapeutic INR, however the patient denies any known trauma and is asymptomatic.  At time of transfer to the medicine service, the patient is hemodynamically stable.  On interview today, the patient denies any memory of what brought him to the hospital and has no specific complaints at this time apart from some mild sore throat which he says is new overnight.  He says he has difficulty swallowing food.  He denies any other complaints including chest pain, pressure, shortness of breath, nausea, vomiting or diarrhea.  He also endorses increased swelling in his right leg  relative to his left which is more than baseline.  He also endorses a "weeping" fluid coming out of his right lower extremity.     MEDICAL HISTORY:    PAST MEDICAL & SURGICAL HISTORIES:   Past Medical History:   Diagnosis Date   . A-fib (CMS [facility])     s/p ablation   . Anemia    . Anticoagulant long-term use     Warfarin   . Arthritis    . CAD (coronary artery disease)     s/p CABG   . Cardiac LV ejection fraction 21-30%    . Cardiomyopathy (CMS [place])     EF 25%   . Cellulitis of lower extremity    . CHF (congestive heart failure) (CMS HCC)    . CKD (chronic kidney disease), stage III (CMS HCC)    . Colonic polyp    . Constipation    . DM2 (diabetes mellitus, type 2) (CMS HCC)    . Edema     bilateral lower extremity   . Gout    . History of cellulitis    . HLD (hyperlipidemia)    . HTN (hypertension)    . Morbid obesity (CMS [place])    . Neuropathy (CMS HCC)     bilateral feet   . Osteomyelitis (CMS [place]) 2015    right ankle and was treated with external fixator for 4 months followed by multiple debridements   . Pacemaker    . Urinary retention    . Use of cane as ambulatory aid     states due to his right ankle- also has a walker and wheelchair    Past Surgical History:   Procedure Laterality Date   . HX CARDIAC ABLATION     . HX COLONOSCOPY     . HX CORONARY ARTERY BYPASS GRAFT     . HX PACEMAKER DEFIBRILLATOR PLACEMENT          HOME MEDICATIONS:  Outpatient Medications Marked as Taking for the 11/11/17 encounter [facility] Encounter)   Medication Sig   . allopurinol (ZYLOPRIM) 300 mg Oral Tablet Take 300 mg by mouth Once a day   . aspirin 81 mg Oral Tablet, Chewable Take 324 mg by mouth Once a day   . bumetanide (BUMEX) 2 mg Oral Tablet Take 2 mg by mouth Twice daily   . carvedilol (COREG) 12.5 mg Oral Tablet Take 12.5 mg by mouth Twice daily with food   . colchicine 0.6 mg Oral Tablet Take 0.6 mg by mouth Every Monday, Wednesday and Friday   . digoxin (LANOXIN) 125 mcg Oral Tablet Take 0.125 mg by mouth Once a day   .  gabapentin (NEURONTIN) 300 mg Oral Capsule Take 300 mg by mouth Three times a day Take 600mg  in the morning, followed by 300mg  in the afternoon, then 600mg  in the evening.   . Leflunomide (ARAVA) 20 mg Oral Tablet Take 20 mg by mouth Once a day   . magnesium oxide (MAG-OX) 400 mg (241.3 mg magnesium) Oral Tablet Take 400 mg by mouth Once a day   . metFORMIN (GLUCOPHAGE) 500 mg Oral Tablet Take 500 mg by mouth Twice daily with food   . pioglitazone (ACTOS) 15 mg Oral Tablet Take 15 mg by mouth Once a day   .  predniSONE (DELTASONE) 2.5 mg Oral Tablet Take 2.5 mg by mouth Once a day before lunch   . sacubitril-valsartan (ENTRESTO) 49-51 mg Oral Tablet Take 1 Tab by mouth Twice daily   . simvastatin (ZOCOR) 20 mg Oral Tablet Take 20 mg by mouth Every evening        ALLERGIES:  He has No Known Allergies.     FAMILY HISTORY:  His family history includes Arthritis-rheumatoid in his brother and father; Congestive Heart Failure in his mother; Coronary Artery Disease in his father.    SOCIAL HISTORY:  He  reports that he drinks alcohol. He  reports that he quit smoking about 7 years ago. His smoking use included cigarettes. He has never used smokeless tobacco. He  reports that he does not use drugs.    REVIEW OF SYSTEMS:    Constitutional: Negative for fevers, chills, sweats, fatigue, malaise, weight loss.  Eyes: Negative for visual disturbance, redness.  Ears, nose, mouth, throat, and face: Negative for hearing loss, earaches, nasal congestion, positive for sore throat.  Respiratory: Negative for cough, sputum, hemoptysis, dyspnea on exertion.  Cardiovascular:  Positive for lower extremity edema right greater than left and increased weeping fluid in right leg; Negative for chest pain, palpitations, lower extremity edema.  Gastrointestinal: Negative for nausea, vomiting, change in bowel habits, abdominal pain.  Genitourinary: Negative for dysuria, hematuria.  Integument/breast:  Positive for right lower extremity rash as per  HPI; Negative pruritus.  Hematologic/lymphatic: Negative for easy bruising, lymphadenopathy.  Musculoskeletal: Negative for myalgias, arthralgias.  Neurological: Negative for headaches, dizziness, weakness.  Behavioral/Psych: Negative for anxiety, depression, sleep disturbance.  Endocrine: Negative for polyuria, polydipsia, temperature intolerance.  Allergic/Immunologic: Negative for urticaria, anaphylaxis.    EXAMINATION:      Temperature: 36.6 C (97.9 F) BP (Non-Invasive): 115/82 Heart Rate: 77   Respiratory Rate: 20 SpO2: 95 % Weight: (!) 162 kg (357 lb 2.3 oz)     General:  Obese appearing male; in no apparent distress.   Head: Normocephalic, atraumatic  Eyes: PERRLA, EOMI, sclareae non-icteric, conjunctivae clear.  Throat: Oropharynx with mild plaque like lesions surrounding uvula, mucous membranes moist.  Neck: Trachea midline, no thyromegaly or lymphadenopathy.   Cardiovascular: RRR, holosystolic murmur, rubs, gallops.  Respiratory: CTABL, no wheezes, rhonchi; mild crackles bilateral bases.  Abdominal: Bowel sounds normal; abdomen soft, non-tender to palpation  Extremities:  2+ pitting edema with right greater than left. Dorsalis pedis pulses 2+ bilaterally.   Skin: Warm and dry.  Venous stasis dermatitis changes over bilateral legs with hyperpigmentation and superficial fluid-filled blisters,   Neurological: Awake, A&O x 3. CNII-XII, strength and sensation grossly intact.  Psychiatric: Normal mood & affect. Thought process linear. Speech content normal    LABS  IMAGING  MICRO:      CBC Differential   Recent Labs     11/13/17  0245 11/14/17  0243 11/15/17  0521   WBC 9.5 13.8* 5.8   HGB 10.8* 11.0* 10.2*   HCT 32.3* 33.6* 30.4*   PLTCNT 70* 86* 79*    Recent Labs     11/15/17  0521   PMNS 89   LYMPHOCYTES 4   MONOCYTES 8   EOSINOPHIL 0   BASOPHILS 0  0.02   PMNABS 5.13   LYMPHSABS 0.20*   MONOSABS 0.44   EOSABS 0.01      BMP LFTs   Recent Labs     11/15/17  0521   SODIUM 136   POTASSIUM 3.9   CHLORIDE  102  CO2 25   BUN 66*   CREATININE 1.41*   GLUCOSENF 213*   ANIONGAP 9   BUNCRRATIO 47*   GFR 51*   CALCIUM 8.3*   MAGNESIUM 2.4   PHOSPHORUS 2.6    No results found for this encounter   CoAgs Blood Gas:   Recent Labs     11/15/17  0521   PROTHROMTME 45.7*   INR 4.03*    No results found for this encounter    Cardiac Markers Lipid Panel   Recent Labs     11/13/17  0245 11/13/17  2314 11/14/17  0240   TROPONINI 22 17 13     No results found for this encounter   Urine Analysis Other Labs   No results found for this encounter No results found for this encounter    Invalid input(s): PRL           No results found for any visits on 11/11/17 (from the past 24 hour(s)).    ASSESSMENT:      CHF, uncontrolled DM 2, atrial fibrillation status post ablation on warfarin for anticoagulation, paced with AICD, CKD 3, gout, and RA who presented originally from [facility] for shock, now as a MICU transfer to the medicine service.    Active Hospital Problems    Diagnosis   . Primary Problem: Septic shock (CMS [facility])   . UTI (urinary tract infection)        PLAN:      Shock, most likely combined septic/cardiogenic secondary to UTI/pyelonephritis versus lower extremity cellulitis  - blood cultures, urine cultures no growth today.  -urology consult to; appreciate recommendations.  - blood Cx 3/17: ngtd, blood Cx 3/18: ngtd, urine Cx 3/17: ngtd, MRSA negative, C diff negative  - right upper quadrant ultrasound on 11/13/2017 demonstrated partially distended gallbladder with gallstones and sludge.  Gallbladder wall is thickened measuring up to 0.6 cm, likely secondary to partial distended gallbladder or underlying chronic process.  Trace pericholecystic fluid.  -patient initially treated with linezolid, cefepime; de-escalated to Rocephin for total 7 day course on 11/13/2017, with stop date of 11/17/2017.   -continue empiric treatment with ceftriaxone.  -Levophed discontinued on 11/14/2016.  -continue weaning stress dose steroids to  baseline prednisone 2.5 mg (patient takes for RA on outpatient basis).  Patient initiated on stress dose steroids on 11/11/2016; receiving last dose tonight.  Set to receive 2.5 mg prednisone standard dose tomorrow morning.     HFrEF  A-fib on warfarin  supratherapeutic INR  cardiogenic shock:  Status/DDx:No baseline O2 requirement.  TTE results and decreased UOP off low dose levophed are concerning for possible cardiorenal syndrome  Workup:  - cardiology EPS consulted; pacer functioning correctly  - TTE; EF 25% with multiple regions of marked akinesis/hypokinesis  - cardiology HF consulted; appreciate recs  Tx:  - tele  - monitor INR  - hold warfarin  - continue digoxin  - lasix 40 IV x1, monitor UOP  - fluid restriction 2L, daily weight  - follow-up Cardiology heart failure recommendations, restart home heart failure medications (see chronic problem list in progress) with hemodynamically appropriate  -patient restarted on home Bumex today; continue to hold Coreg (due to element of cardiogenic shock/acute CHF exacerbation) and Entresta (due to to hypotension)  -lymphedema consult ordered; appreciate recs.  -will consider adding back Coreg/Entresta based on cardiology HF recommendation.    Bladder dome lesion:  - CT renal calculus protocol and cystogram reviewed  - urology consulted  - s/p uretheral stricture  dilation  - no additional imaging requiring  - pt will need f/u cystoscopy outpatient; urology has referred (3 week follow-up; orders placed in epic)  - pt to be d/c with foley    AKI on CKD III (improving):  - likely 2/2 septic shock; per report, baseline Cr ~1.6; was 3.6 at OSH  - urine lytes suggest pre-renal etioogy  - strict I/Os  -creatinine 1.41 at time of transfer.  -will continue to monitor with daily BMPs.    Probable left psoas hematoma:  - noted on CT 3/18, asymptomatic  - no hx recent trauma; likely 2/2 supratherapeutic INR  -will monitor.    Lymphedema  lower extremity wounds:  - wound  care and lymphedema OT consulted; appreciate recs    DM2, uncontrolled:  - A1c 8.2  - hold home PO agents  - SSI  - increase lantus to 20 from 10 u qhs  - holding gabapentin given AMS on presentation and AKI      Chronic medical problems:  - CAD s/p CABG  HTN  HLD: hold coreg, entresto (non-formulary), statin  - gout: hold allopurinol, colchicine  - BPH: hold tamsulosin  - Hypomagnesemia: hold PO supplement, replace prn  - RA: continue prednisone, hold leflunamide    ____________________________________________________________________    Prophylaxis:   - DVT: held d/t INR   - GI: Not indicated  Analgesia: None  Nutrition: Diabetic and Cardiac  Therapy: OT and PT  Bowel regimen: senna, colace          Joshua C. Lurline Hare MD, PhD   PGY1, Transitional Year Resident   Pager [phone]      Late entry for 11/15/17. I saw and examined the patient.  I reviewed the resident's note.  I agree with the findings and plan of care as documented in the resident's note.  Any exceptions/additions are edited/noted.    Lurene Shadow, MD

## 2017-11-15 NOTE — Nurses Notes (Signed)
Patient arrived from [facility].  Patient oriented to unit.  BA on and in place.  Patient alert and oriented X4 educated to call dont fall and verbalized understanding.  Diet and fliud restriction continued.  VS and assessment per flowsheet.  Will continue to monitor.

## 2017-11-15 NOTE — Care Management Notes (Signed)
[facility]  Care Management Note    Patient Name: Andrew Arias  Date of Birth: 08/10/1956  Sex: male  Date/Time of Admission: 11/11/2017  7:20 AM  Room/Bed: 520/A  Payor: [place] / Plan: [place] [facility] / Product Type: MEDICARE [facility] /    LOS: 4 days   Primary Care Providers:  Pcp, No (General)    Admitting Diagnosis:  UTI (urinary tract infection) [N39.0]    Assessment:      11/15/17 0945   Assessment Details   Assessment Type Continued Assessment   Date of Care Management Update 11/15/17   Date of Next DCP Update 11/16/17   Care Management Plan   Discharge Planning Status plan in progress   Projected Discharge Date 11/20/17   Discharge Needs Assessment   Discharge Facility/Level of Care Needs Home vs Acute Rehab (not psych)   OT lymphedema consulted.  PT/OT recommending in-pt rehab.  Checking on pts benefit coverage for in-pt rehab.  Will follow.    Discharge Plan:  Home vs acute rehab (not psych)      The patient will continue to be evaluated for developing discharge needs.     Case Manager: Rosann Auerbach, [place]  Phone: [phone]

## 2017-11-15 NOTE — Care Plan (Signed)
[facility]  Physical Therapy Progress Note      Patient Name: Andrew Arias  Date of Birth: 27-Dec-1955  Height:  193 cm (6' 3.98")  Weight:  (!) 162 kg (357 lb 2.3 oz)  Room/Bed: 520/A  Payor: [place] / Plan: [place] [facility] / Product Type: MEDICARE [facility] /     Assessment:     Patient needed mod assist x 2 to stand. Ambulated 12 feet with WW and min assist x 2. Unable to return home at this time.    Discharge Needs:   Equipment Recommendation: none anticipated    Discharge Disposition: inpatient rehabilitation facility    JUSTIFICATION OF DISCHARGE RECOMMENDATION   Based on current diagnosis, functional performance prior to admission, and current functional performance, this patient requires continued PT services in inpatient rehabilitation facility in order to achieve significant functional improvements in these deficit areas: muscle performance, gait, locomotion, and balance.      Plan:   Continue to follow patient according to established plan of care.  The risks/benefits of therapy have been discussed with the patient/caregiver and he/she is in agreement with the established plan of care.     Subjective & Objective:        11/15/17 1058   Therapist Pager   PT Assigned/ Pager # [phone]   Rehab Session   Document Type evaluation   Total PT Minutes: 15   Patient Effort good   Symptoms Noted During/After Treatment fatigue   General Information   Patient Profile Reviewed? yes   Medical Lines Telemetry;PIV Line   Respiratory Status room air   Mutuality/Individual Preferences   Individualized Care Needs OOB with sara stedy   Pre Treatment Status   Pre Treatment Patient Status Patient sitting in bedside chair or w/c;Call light within reach;Telephone within reach;Nurse approved session   Support Present Pre Treatment  Nurse present   Communication Pre Treatment  Nurse   Pain Assessment   Pre/Post Treatment Pain Comment No C/O pain.   Transfer Assessment/Treatment   Sit-Stand  Independence moderate assist (50% patient effort);2 person assist required   Stand-Sit Independence moderate assist (50% patient effort);2 person assist required   Sit-Stand-Sit, Assist Device walker, front wheeled   Gait Assessment/Treatment   Independence  minimum assist (75% patient effort);2 person assist required   Assistive Device  walker, front wheeled   Distance in Feet 12   Post Treatment Status   Post Treatment Patient Status Patient sitting in bedside chair or w/c;Call light within reach;Telephone within reach   Support Present Post Treatment  None   Communication Post Treatement Nurse   Plan of Care Review   Plan Of Care Reviewed With patient   Physical Therapy Clinical Impression   Assessment Patient needed mod assist x 2 to stand. Ambulated 12 feet with WW and min assist x 2. Unable to return home at this time.   Anticipated Equipment Needs at Discharge (PT) none anticipated   Anticipated Discharge Disposition inpatient rehabilitation facility   Planned Therapy Interventions, PT Eval   Planned Therapy Interventions (PT) balance training;bed mobility training;gait training;transfer training       Therapist:   Carollee Herter, PT   Pager #: [phone]

## 2017-11-15 NOTE — Care Plan (Signed)
[facility]  Occupational Therapy Progress Note    Patient Name: Andrew Arias  Date of Birth: 01/19/1956  Height:  193 cm (6' 3.98")  Weight:  (!) 162 kg (357 lb 2.3 oz)  Room/Bed: 520/A  Payor: [place] / Plan: [place] [facility] / Product Type: MEDICARE [facility] /     Assessment:    Pt tolerated OT treatment fairly well. Pt limited d/t fatigue this session. Pt participated in ADL transfers with FWW, fxnl mobility less than household distances with FWW, & LB dressing assist x1-2. More assist required for STS transfers. Will continue to follow pt. Would recommend acute rehab placement to increase pt (I) with ADLs & fxnl activity.       Discharge Needs:   Equipment Recommendation: bariatric, tub bench    Discharge Disposition:  inpatient rehabilitation facility   The above recommendation is based upon the current examination and evaluation performed on this date. As subsequent sessions are completed, recommendations will be updated accordingly.    JUSTIFICATION OF DISCHARGE RECOMMENDATION   Based on current diagnosis, functional performance prior to admission, and current functional performance, this patient requires continued OT services in inpatient rehabilitation facility in order to achieve significant functional improvements.    Plan:   Continue to follow patient according to established plan of care.  The risks/benefits of therapy have been discussed with the patient/caregiver and he/she is in agreement with the established plan of care.     Subjective & Objective:        11/15/17 1059   Therapist Pager   OT Assigned/ Pager # Kjersten [place] [phone]   Rehab Session   Document Type evaluation   Total OT Minutes: 15   Patient Effort good   Symptoms Noted During/After Treatment fatigue   General Information   Patient Profile Reviewed? yes   Medical Lines Telemetry;PIV Line   Respiratory Status room air   Existing Precautions/Restrictions full code;fall precautions   Pre Treatment Status   Pre  Treatment Patient Status Patient sitting in bedside chair or w/c;Telephone within reach;Call light within reach;Nurse approved session   Support Present Pre Treatment  Nurse present   Communication Pre Treatment  Nurse   Communication Pre Treatment Comment Pt/RN agreeable to OT consult, seen with professional assist of PT   Mutuality/Individual Preferences   Individualized Care Needs OOB via sara stedy A x2    Self-Care   Current Activity Tolerance moderate   Pain Assessment   Pre/Post Treatment Pain Comment no c/o pain   Coping/Psychosocial Response Interventions   Plan Of Care Reviewed With patient   Cognitive Assessment/Interventions   Behavior/Mood Observations behavior appropriate to situation, WNL/WFL;alert;cooperative   Orientation Status oriented x 4   Attention WNL/WFL   Follows Commands WNL   Mobility Assessment/Training   Mobility Comment fxnl mobility ~30ft with Min A & FWW, chair follow for safety    Transfer Assessment/Treatment   Sit-Stand Independence moderate assist (50% patient effort);2 person assist required   Stand-Sit Independence moderate assist (50% patient effort);2 person assist required   Sit-Stand-Sit, Assist Device walker, front wheeled   Transfer Safety Issues balance decreased during turns;sequencing ability decreased;step length decreased;weight-shifting ability decreased   Transfer Impairments endurance;strength decreased;postural control impaired   Lower Body Dressing Assessment/Training   Comment DEP A to donn socks, pt declined to attempt    Balance Skill Training   Comment use of FWW   Sitting Balance: Static fair + balance   Sit-to-Stand Balance fair balance  Standing Balance: Static fair balance   Standing Balance: Dynamic fair - balance   Post Treatment Status   Post Treatment Patient Status Patient sitting in bedside chair or w/c;Call light within reach;Telephone within reach   Support Present Post Treatment  None   Communication Post Treatement Nurse   Occupational Therapy  Clinical Impression   Functional Level at Time of Session Pt tolerated OT treatment fairly well. Pt limited d/t fatigue this session. Pt participated in ADL transfers with FWW, fxnl mobility less than household distances with FWW, & LB dressing assist x1-2. More assist required for STS transfers. Will continue to follow pt. Would recommend acute rehab placement to increase pt (I) with ADLs & fxnl activity.    Anticipated Equipment Needs at Discharge bariatric;tub bench   Anticipated Discharge Disposition inpatient rehabilitation facility       Therapist:   Sabino Snipes, MOT, OTR/L  Pager #: [phone]

## 2017-11-15 NOTE — OT Evaluation (Signed)
Lymphedema Therapy    Lymphedema Therapy eval completed- full report to follow.  Vascular studies and additional consultants notes reviewed.  I do not feel that the benefits of compression at this time outweigh the potential risks, and the pts overall personal health goals are not in line with making the significant commitment it would take to learn and follow through with a lymphedema therapy treatment program.  Will sign off.  Gordy Clement, OTR/L, CLT  pager [phone]

## 2017-11-15 NOTE — Progress Notes (Signed)
MICU Transfer Note     Name: Andrew Arias, Andrew Arias, 62 y.o. male Date of Service:  11/15/2017   Date of Birth:  13-Jul-1956 Date of Admission:  11/11/2017   PCP: No Pcp Length of Stay:  LOS: 4 days    MRN: [id number] Attending: Zachery Conch, MD   Code Status: Full Code Admitted for: Septic shock (CMS [facility])     HPI: Andrew Arias is a 62 y/o M with CAD s/p CABG, CHF, uncontrolled DM2, A-fib s/p ablation on warfarin and paced with AICD, CKD III, BPH, gout, and RA who presented as a transfer from [facility] for shock, on evaluation favored to be combined septic/cardiogenic.  Pt initially presented after experiencing several days of fatigue, minimal p.o. intake, and mild AMS.  At the outside facility he appeared to have urinary retention with inability to pass straight cath, but producing minimal purulent fluid.  CT abdomen noted a bladder dome lesion concerning for bladder diverticulum.  He received a dose of vancomycin and Zosyn and a 2L fluid bolus, continued to have worsening confusion and hypotension requiring Levophed support.  He was transferred to [facility] for further evaluation and treatment.  Patient was treated for shock and infectious workup was overall unremarkable for an obvious source with blood cultures and urine cultures remaining negative.  However renal ultrasound revealed perinephric stranding concerning for underlying pyelonephritis.  The patient was placed on stress dose steroids and broad-spectrum antibiotics were started, now narrowed to rocephin.  Urology was consulted, the patient required urethral dilation for Foley placement; this possibly contributed to his urinary retention.  CT renal calculus protocol and cystogram were performed to further characterize the bladder lesion; per urology, no additional imaging is required however the patient will need a cystoscopy on outpatient follow-up and is to be discharged with a Foley.  Patient was noted to have an elevated creatinine of  3.6, with a 1.6 baseline per outside records, favored as likely AKI on CKD 3 d/t ATN in the setting of sepsis.  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  The ECG on admission was notable for possible pacemaker further, so Cardiology EPS interrogated his pacemaker which was found to be working properly.  TTE showed EF 25% with multiple regions of akinesis/hypokinesis; no echo records were available for comparison, but the pt follows in cardiology with Dr. Jerral Bonito in [place].  Cardiology HF was consulted and recommended optimizing diuresis with resolution of AKI; at transfer, patient's home Bumex was restarted while Coreg and Entresto remained held due to hypotension.  The patient had a supratherapeutic INR and after receiving 1 dose of home warfarin, this was held and his INR is now downtreding.  A possible left psoas hematoma was noted incidentally on imaging, likely due to supratherapeutic INR, but the patient denies known trauma and has no symptoms from this.  A transfer, patient was hemodynamically stable.    Respiratory status:   Was the patient intubated: no    If yes, extubation date: NA   Current oxygen requirement: None (Room Air)    Vasopressor support or drips to decrease their blood pressures:     yes    If yes, discontinue date: 10/06/45    Complications while in the ICU: No  Patient arrest during hospitalization: no    Current lines / tubes / drains:  - central line  - PIV  - foley    Special  imaging/or procedures were performed:   - CT renal calculus protocol, cystogram    Antibiotics during ICU stay:  Antimicrobials Start date End date   1. cefepime 3/17 3/19   2. linezolid 3/17 3/19   3. rocephin 3/19 3/23   4.      5.        Indication: empiric sepsis tx, probable pyelonephritis    Is the patient on stress dosed steroids? Yes    Consults:  Cardiology, Urology, Wound Care    Pending labs,  procedures, or studies:   - none    Special considerations:   - none    Plan of care:   - continue empiric antibiotic treatment for sepsis; ceftriaxone with end date 3/23  - continue wean of stress dose steroids to baseline prednisone 2.5 mg daily for RA; restart leflunamide  - follow-up Cardiology heart failure recommendations, restart home heart failure medications (see chronic problem list in progress) with hemodynamically appropriate  - continue wound care for lower limb lymphedema  - Monitor INR, and restart warfarin when appropriate  - restart all other home medications as indicated  - optimize diabetic control; A1c 8.2    Code Status: Full Code  Medical Power of Attorney: No      Andrew Cagey. Caron Presume, MD  Transitional Year PGY-1  Pager: [phone]    ------------------------------------------------------------------------------------------------------------------------------------------------  I saw and examined the patient, personally performed the critical or key portions of the service and discussed patient management with the ICU team including the resident, fellow, nurse and ancillary staff. I reviewed the resident's note and agree with the history, findings and plan of care. Laboratory, hemodynamic, respiratory support, and radiological data were also reviewed and discussed. Exceptions are edited/noted above.    Critical Care time/teaching addendum: see progress note    Zachery Conch, MD  Section of Pulmonary, Critical Care and Sleep Medicine  Department of Medicine, [facility]  11/15/2017, 14:35

## 2017-11-15 NOTE — Consults (Signed)
[facility]  Follow up Note:    Caius, Silbernagel, 62 y.o. male  Date of Service: 11/15/2017  Date of Birth: [birth year]  LOS: 4 days  Requesting Provider/Service: MICU 2    Reason for Admission: Heart Failure        Subjective:      No overnight event and pt is off from pressor and now will be transfer to medicine team.      CAD hx:  October 2015 he had undergone surgical revascularization having received a LIMA sequenced to the diagonal and LAD and saphenous vein graft to the obtuse marginal and free radial to the PDA of the right coronary artery. Unfortunately, there is no meaningful improvement with regard to his left ventricular function. His last echocardiogram of January 2017 reported an ejection fraction of 15%. This was similar to a study performed in October 2016.        PMH:  Diagnosis   . Essential hypertension, benign   . Pure hypercholesterolemia   . Coronary atherosclerosis of native coronary artery   . Splenomegaly   . Postsurgical percutaneous transluminal coronary angioplasty status   . Cardiomyopathy (Notable Code)   . Dyspnea   . Long term current use of anticoagulant therapy   . Fatigue   . Shortness of breath   . Edema   . Chronic a-fib (Notable Code)   . Ventricular tachycardia seen on cardiac monitor (Notable Code)   . Acute on chronic systolic heart failure (Notable Code)   . Morbid obesity with BMI of 40.0-44.9, adult (Notable Code)   . CABG x 4 --LIMA->D,LIMA->LAD, SVG->OM, RIMA->PDA on 06/15/14 by Dr.Navid   . AICD (automatic cardioverter/defibrillator) present BiV ICD  . Sleep apnea   . Bacteremia   . Osteomyelitis of ankle (Notable Code)   . Pulmonary HTN (Notable Code)   . Decubitus ulcer of right ankle, stage 4 (Notable Code)   . Primary cardiomyopathy (Notable Code)   . Type 2 diabetes mellitus without complication (Notable Code)   . Encounter for long-term (current) use of medications   . CHF exacerbation (Notable Code)   . Screening for thyroid  disorder   . Screening, anemia, deficiency, iron   . Cardiac LV ejection fraction 15% as shown on Echocardiogram of 09-08-2015          Home Meds:  Medication Sig   . allopurinol (ZYLOPRIM) 300 mg oral tablet Take 300 mg by mouth daily   . aspirin (ECOTRIN) 325 mg oral delayed-release tablet Take 325 mg by mouth daily   . bumetanide (BUMEX) 2 mg oral tablet Take by mouth daily 1-2 mg As need   . carvedilol (COREG) 12.5 mg oral tablet Take 12.5 mg by mouth 2 times a day with meals   . colchicine (COLCRYS) 0.6 mg oral tablet Take 0.6 mg by mouth 3 times per week   . cyanocobalamin (VITAMIN B-12) 1,000 mcg oral tablet Take 1,000 micrograms by mouth daily   . digoxin (LANOXIN) 125 mcg oral tablet Take 125 micrograms by mouth daily   . ferrous gluconate 324 mg (37.5 mg iron) oral tablet Take 1 tablet by mouth daily   . folic acid (FOLVITE) 833 mcg oral tablet Take 800 micrograms by mouth daily   . gabapentin (NEURONTIN) 300 mg oral capsule Take 300 mg by mouth Patient is currently taking 300 mg in AM and 600 mg in PM   . insulin detemir (LEVEMIR FLEXPEN) 100 unit/mL (3 mL) subcutaneous flextouch  Inject into the skin every morning and at bedtime 24 units in AM and 52 in PM    . isosorbide mononitrate (IMDUR) 30 mg oral extended-release tablet Take 30 mg by mouth daily   . magnesium oxide (MAG-OX) 400 mg oral tablet Take 400 mg by mouth daily   . potassium bicarbonate (EFFER-K) 25 mEq oral disintegrating tablet Take 25 mEq by mouth as needed   . predniSONE (DELTASONE) 2.5 mg oral tablet Take 2.5 mg by mouth daily   . warfarin (COUMADIN) 5 mg oral tablet TAKE ONE TO TWO TABLETS BY MOUTH DAILY AS DIRECTED BY [facility] D[facility]   . docusate sodium (COLACE) 100 mg oral capsule Take 100 mg by mouth daily   . Ferrous Fumarate (FERRETTS) 325 mg (106 mg iron) oral tablet Take 1 tablet by mouth daily   . insulin aspart (NOVOLOG FLEXPEN) 100 unit/mL subcutaneous flexpen Inject into the skin 2 times a day before meals Sliding scale   .  leflunomide (ARAVA) 10 mg oral tablet Take 10 mg by mouth daily   . metOLazone (ZAROXOLYN) 2.5 mg oral tablet Take 2.5 mg by mouth daily As need per kidney MD   . polyethylene glycol (MIRALAX) 17 gram oral packet Take 17 g by mouth daily   . Saccharomyces boulardii (FLORASTOR) 250 mg oral capsule Take 250 mg by mouth daily   . sacubitril-valsartan (ENTRESTO) 24-26 mg oral tablet Take by mouth 2 times a day   . simvastatin (ZOCOR) 20 mg oral tablet Take 20 mg by mouth at bedtime   . tamsulosin (FLOMAX) 0.4 mg oral extended-release capsule Take 0.4 mg by mouth at bedtime           Dyspnea:  Admits:  With moderate exertion   Edema: Admits Lower Extremity Edema: bilaterally  knee(s)  1+   Orthostasis  denies    pillow orthopnea denies    Paroxysmal nocturnal dyspnea denies    right upper quadrant pain denies    abdominal bloating denies    early satiety denies    nausea denies    anorexia denies    syncope denies    presyncope denies    exertional lightheadedness denies    defibrillator firings denies    palpations denies    chest pain Denies   ethanol denies    tobacco denies    Substance use denies     Risk Factors  Family history of heart failure: Yes. mother  Family history of sudden cardiac death: no  Arrhythmia present?No  Renal Failure:Yes,       - Chronic Kidney Disease  (CKD) Stage 3   Pulmonary Hypertension: no  Anemia: Yes,   Acute  Anemia source: blood loss  Malnutrition: No, dietitian consult      Past Medical History:   Diagnosis Date   . A-fib (CMS HCC)     s/p ablation   . Anemia    . Anticoagulant long-term use     Warfarin   . Arthritis    . CAD (coronary artery disease)     s/p CABG   . Cardiac LV ejection fraction 21-30%    . Cardiomyopathy (CMS [place])     EF 25%   . Cellulitis of lower extremity    . CHF (congestive heart failure) (CMS HCC)    . CKD (chronic kidney disease), stage III (CMS HCC)    . Colonic polyp    . Constipation    . DM2 (diabetes mellitus, type 2) (CMS HCC)    .  Edema     bilateral  lower extremity   . Gout    . History of cellulitis    . HLD (hyperlipidemia)    . HTN (hypertension)    . Morbid obesity (CMS [place])    . Neuropathy (CMS HCC)     bilateral feet   . Osteomyelitis (CMS [place]) 2015    right ankle and was treated with external fixator for 4 months followed by multiple debridements   . Pacemaker    . Urinary retention    . Use of cane as ambulatory aid     states due to his right ankle- also has a walker and wheelchair           Past Surgical History:   Procedure Laterality Date   . HX CARDIAC ABLATION     . HX COLONOSCOPY     . HX CORONARY ARTERY BYPASS GRAFT     . HX PACEMAKER DEFIBRILLATOR PLACEMENT             Patient Active Problem List   Diagnosis   . UTI (urinary tract infection)   . Morbid obesity   . Septic shock (CMS HCC)       Prior to Admission Medications:  Medications Prior to Admission     Prescriptions    allopurinol (ZYLOPRIM) 300 mg Oral Tablet    Take 300 mg by mouth Once a day    aspirin 81 mg Oral Tablet, Chewable    Take 324 mg by mouth Once a day    bumetanide (BUMEX) 2 mg Oral Tablet    Take 2 mg by mouth Twice daily    carvedilol (COREG) 12.5 mg Oral Tablet    Take 12.5 mg by mouth Twice daily with food    colchicine 0.6 mg Oral Tablet    Take 0.6 mg by mouth Every Monday, Wednesday and Friday    digoxin (LANOXIN) 125 mcg Oral Tablet    Take 0.125 mg by mouth Once a day    gabapentin (NEURONTIN) 300 mg Oral Capsule    Take 300 mg by mouth Three times a day Take 600mg  in the morning, followed by 300mg  in the afternoon, then 600mg  in the evening.    Leflunomide (ARAVA) 20 mg Oral Tablet    Take 20 mg by mouth Once a day    magnesium oxide (MAG-OX) 400 mg (241.3 mg magnesium) Oral Tablet    Take 400 mg by mouth Once a day    metFORMIN (GLUCOPHAGE) 500 mg Oral Tablet    Take 500 mg by mouth Twice daily with food    pioglitazone (ACTOS) 15 mg Oral Tablet    Take 15 mg by mouth Once a day    predniSONE (DELTASONE) 2.5 mg Oral Tablet    Take 2.5 mg by mouth Once a day  before lunch    sacubitril-valsartan (ENTRESTO) 49-51 mg Oral Tablet    Take 1 Tab by mouth Twice daily    simvastatin (ZOCOR) 20 mg Oral Tablet    Take 20 mg by mouth Every evening          Past Family History:   Family Medical History:     Problem Relation (Age of Onset)    Arthritis-rheumatoid Father, Brother    Congestive Heart Failure Mother    Coronary Artery Disease Father          Social History  Social History     Substance and Sexual Activity   Alcohol Use Yes   .  Frequency: 2-4 times a month      reports that he quit smoking about 7 years ago. His smoking use included cigarettes. He has never used smokeless tobacco.   reports that he does not use drugs.    ROS: Other than ROS in the HPI, all other systems were negative.      Exam:   Temperature: 36.4 C (97.5 F)  Heart Rate: 77  BP (Non-Invasive): 115/82  Respiratory Rate: 20  SpO2: 95 %  Pain Score (Numeric, Faces): 0        Current Weight:   No data found.    General:NAD,appears chronically ill  HENT:NC/AT; oral mucous membranesmoist  Eyes:conjunctivaeclear, non-icteric; PERRLA, L lateral upper lid lesion  Neck:trachea midline  Cardiovascular:RRR; normal Y8/M5; holosystolic murmur  Pulmonary / Chest:Normal respiratory effort; CTAB but mild crackles at bilateral bases. NC in place  GI / Abdominal:Morbidly obese, soft, non-distended; BS normal; no TTP  Extremities:bilateral pittingedema;nocyanosis; well-perfused  Skin:Warm and dry, venous stasis dermatitis changes over bilateral legs with hyperpigmentation and some superficial fluid blisters, R>L; wound dressings in place  Neurologic:CN grossly intact. Mentation improved    LABS:  Results for orders placed or performed during the hospital encounter of 11/11/17 (from the past 24 hour(s))   PT/INR daily X 7   Result Value Ref Range    PROTHROMBIN TIME 45.7 (H) 9.5 - 14.1 seconds    INR 4.03 (H) 0.80 - 1.20    Narrative    Coumadin therapy INR range for Conventional Anticoagulation is 2.0  to 3.0 and for Intensive Anticoagulation 2.5 to 3.5.   BASIC METABOLIC PANEL   Result Value Ref Range    SODIUM 136 136 - 145 mmol/L    POTASSIUM 3.9 3.5 - 5.1 mmol/L    CHLORIDE 102 96 - 111 mmol/L    CO2 TOTAL 25 22 - 32 mmol/L    ANION GAP 9 4 - 13 mmol/L    CALCIUM 8.3 (L) 8.5 - 10.2 mg/dL    GLUCOSE 213 (H) 65 - 139 mg/dL    BUN 66 (H) 8 - 25 mg/dL    CREATININE 1.41 (H) 0.62 - 1.27 mg/dL    BUN/CREA RATIO 47 (H) 6 - 22    ESTIMATED GFR 51 (L) >59 mL/min/1.65m^2   CBC/DIFF    Narrative    The following orders were created for panel order CBC/DIFF.  Procedure                               Abnormality         Status                     ---------                               -----------         ------                     CBC WITH DIFF[[id number]]                Abnormal            Final result                 Please view results for these tests on the individual orders.   MAGNESIUM   Result Value Ref Range  MAGNESIUM 2.4 1.6 - 2.5 mg/dL   PHOSPHORUS   Result Value Ref Range    PHOSPHORUS 2.6 2.3 - 4.0 mg/dL   CBC WITH DIFF   Result Value Ref Range    WBC 5.8 3.5 - 11.0 x10^3/uL    RBC 3.21 (L) 4.06 - 5.63 x10^6/uL    HGB 10.2 (L) 12.5 - 16.3 g/dL    HCT 30.4 (L) 36.7 - 47.0 %    MCV 94.8 78.0 - 100.0 fL    MCH 31.8 27.4 - 33.0 pg    MCHC 33.6 32.5 - 35.8 g/dL    RDW 18.6 (H) 12.0 - 15.0 %    PLATELETS 79 (L) 140 - 450 x10^3/uL    MPV 9.3 7.5 - 11.5 fL    NEUTROPHIL % 89 %    LYMPHOCYTE % 4 %    MONOCYTE % 8 %    EOSINOPHIL % 0 %    BASOPHIL % 0 %    NEUTROPHIL # 5.13 1.50 - 7.70 x10^3/uL    LYMPHOCYTE # 0.20 (L) 1.00 - 4.80 x10^3/uL    MONOCYTE # 0.44 0.30 - 1.00 x10^3/uL    EOSINOPHIL # 0.01 0.00 - 0.50 x10^3/uL    BASOPHIL # 0.02 0.00 - 0.20 x10^3/uL   POC BLOOD GLUCOSE (RESULTS)   Result Value Ref Range    GLUCOSE, POC 278 (H) 70 - 105 mg/dl   POC BLOOD GLUCOSE (RESULTS)   Result Value Ref Range    GLUCOSE, POC 249 (H) 70 - 105 mg/dl   POC BLOOD GLUCOSE (RESULTS)   Result Value Ref Range    GLUCOSE, POC 245  (H) 70 - 105 mg/dl   POC BLOOD GLUCOSE (RESULTS)   Result Value Ref Range    GLUCOSE, POC 219 (H) 70 - 105 mg/dl     Lab Results   Component Value Date    CO2 25 11/15/2017    CO2 17 (L) 11/14/2017    BUN 66 (H) 11/15/2017    BUN 71 (H) 11/14/2017    CREATININE 1.41 (H) 11/15/2017    CREATININE 1.78 (H) 11/14/2017    WBC 5.8 11/15/2017    WBC 13.8 (H) 11/14/2017    HGB 10.2 (L) 11/15/2017    HGB 11.0 (L) 11/14/2017     Lab Results   Component Value Date    AST 36 11/11/2017    AST 35 11/11/2017    ALT 9 11/11/2017    ALT 10 11/11/2017     No results found for: TRIG, HDL  No results found for: TSH  INR (no units)   Date Value   11/15/2017 4.03 (H)   11/14/2017 4.13 (H)   11/13/2017 3.78 (H)         Additional lab testing ordered: none    Chest X-ray:   11/11/2017  IMPRESSION:  Cardiomegaly without acute process.    TTE 11/12/2017:  Findings:  Left Ventricle: Normal left ventricular size. Severely depressed left ventricular systolic function. LV Ejection Fraction  is 25 %. Normal geometry. Paradoxical septal motion consistent with RV pacemaker.  Resting Segmental Wall Motion Analysis: Total wall motion score is 2.24. There is akinesis of the mid to apical  anterolateral wall. There is akinesis of the apical cap. There is akinesis of the apical inferoseptal wall. The remaining  left ventricular segments demonstrate hypokinesis.  Right Ventricle: Mildly dilated right ventricle. Mildly depressed right ventricular systolic function. Echo density in  right ventricle suggestive of catheter, pacer lead, or ICD lead.  Left Atrium: The left atrium is normal in size.  Right Atrium: Mildly dilated right atrium.  Atrial Septum: The interatrial septum is normal in appearance.  Mitral Valve: There is moderate secondary mitral regurgitation.  Aortic Valve: Normal aortic valve. No aortic regurgitation seen. Trileaflet aortic valve. No Aortic Valve Stenosis.  Tricuspid Valve: There is moderate tricuspid regurgitation.  Pulmonic Valve:  Normal pulmonic valve.  Pericardium: Normal pericardium with no pericardial effusion.  Aorta: Normal aortic root.  IVC: The inferior vena cava is dilated.  Pulmonary Artery: Normal pulmonary artery size.      Heart Failure Therapies:   ARN1/ACE-I/ARB: No: Why? hypotension  Beta Blocker: No: Why? hypotension  Aldosterone antagonist: No: Why? hypotension  Device: Yes   CRT-D    Family Communication:   Code Status:  Code Status Information     Code Status    Full Code            Assessment/Recommendations:  Acute on Chronic Systolic CHF. Requiring Cardiogenic Shock: no / ischemic cardiomyopathy/ Mild right ventricular dysfunction / Clinically mildly fluid overloaded on examination/ ACC/AHA Stage D, NYHA functional classIII    Diagnosis   . Essential hypertension, benign   . Pure hypercholesterolemia   . Coronary atherosclerosis of native coronary artery   . Splenomegaly   . Postsurgical percutaneous transluminal coronary angioplasty status   . Cardiomyopathy (Notable Code)   . Dyspnea   . Long term current use of anticoagulant therapy   . Fatigue   . Shortness of breath   . Edema   . Chronic a-fib (Notable Code)   . Ventricular tachycardia seen on cardiac monitor (Notable Code)   . Acute on chronic systolic heart failure (Notable Code)   . Morbid obesity with BMI of 40.0-44.9, adult (Notable Code)   . CABG x 4 --LIMA->D,LIMA->LAD, SVG->OM, RIMA->PDA on 06/15/14 by Dr.Navid   . AICD (automatic cardioverter/defibrillator) present   . Sleep apnea   . Bacteremia   . Osteomyelitis of ankle (Notable Code)   . Pulmonary HTN (Notable Code)   . Decubitus ulcer of right ankle, stage 4 (Notable Code)   . Primary cardiomyopathy (Notable Code)   . Type 2 diabetes mellitus without complication (Notable Code)   . Encounter for long-term (current) use of medications   . CHF exacerbation (Notable Code)   . Screening for thyroid disorder   . Screening, anemia, deficiency, iron   . Cardiac LV ejection fraction 15% as shown on  Echocardiogram of 09-08-2015    Urosepsis  Supratheraputic INR  AKI over CKD stage III  AF on warfarin  DM type II    - Agree with Bumex 2mg  PO BID home dose here. He had UOP for 24 hrs for 2200 ml, negative 2179 ml and negative 3836 ml since admission here.  - Please do Strict I/O, daily weight, fluid restriction and 2 g sodium diet.  - Recommended Lymphedema consult here if not done yet.  - Please do icd ([employer]) interrogation here if not done yet.  - Will consider adding more CHF meds tomorrow depending on BP.  - Pt is on Bumex 2mg  BID, Entresto 49-51 mg BID and Coreg 12.5 mg BID at home and we will recommend to add it if BP is stable then starting from tomorrow starting with entresto 24-26 mg BID after our evaluation.     Please place consult order in [place] if not done yet.    Thank you for allowing [place] to participate in the care of your  patient.  If you have any questions please do not hesitate to call.     Holley Bouche, MD    Late entry for 11/15/17. I saw and examined the patient.  I reviewed the fellow's note.  I agree with the findings and plan of care as documented in the fellow's note.  Any exceptions/additions are edited/noted.      Royston Cowper Sidrah Harden, DO [facility]

## 2017-11-16 DIAGNOSIS — R791 Abnormal coagulation profile: Secondary | ICD-10-CM

## 2017-11-16 DIAGNOSIS — I890 Lymphedema, not elsewhere classified: Secondary | ICD-10-CM

## 2017-11-16 LAB — ECG 12-LEAD
Atrial Rate: 78 {beats}/min
Calculated T Axis: 33 degrees
QRS Duration: 196 ms
QT Interval: 510 ms
QT Interval: 510 ms
QTC Calculation: 602 ms
Ventricular rate: 84 {beats}/min

## 2017-11-16 LAB — MANUAL DIFFERENTIAL (CELLAVISION)
BASOPHIL #: 0 10*3/uL (ref 0.00–0.20)
BASOPHIL #: 0 x10?3/uL (ref 0.00–0.20)
EOSINOPHIL #: 0 10*3/uL (ref 0.00–0.50)
LYMPHOCYTE #: 0.23 x10ˆ3/uL — ABNORMAL LOW (ref 1.00–4.80)
LYMPHOCYTE %: 4 %
METAMYELOCYTES: 2 % (ref 0–2)
MONOCYTE #: 0.22 x10ˆ3/uL — ABNORMAL LOW (ref 0.30–1.00)
MYELOCYTES: 1 % — ABNORMAL HIGH (ref 0–0)
NEUTROPHIL #: 4.98 10*3/uL (ref 1.50–7.70)

## 2017-11-16 LAB — BASIC METABOLIC PANEL
ANION GAP: 9 mmol/L (ref 4–13)
ANION GAP: 11 mmol/L (ref 4–13)
BUN/CREA RATIO: 54 — ABNORMAL HIGH (ref 6–22)
BUN/CREA RATIO: 48 — ABNORMAL HIGH (ref 6–22)
BUN: 62 mg/dL — ABNORMAL HIGH (ref 8–25)
BUN: 57 mg/dL — ABNORMAL HIGH (ref 8–25)
CALCIUM: 8.4 mg/dL — ABNORMAL LOW (ref 8.5–10.2)
CALCIUM: 8.4 mg/dL — ABNORMAL LOW (ref 8.5–10.2)
CALCIUM: 8.2 mg/dL — ABNORMAL LOW (ref 8.5–10.2)
CHLORIDE: 105 mmol/L (ref 96–111)
CHLORIDE: 103 mmol/L (ref 96–111)
CHLORIDE: 103 mmol/L (ref 96–111)
CO2 TOTAL: 25 mmol/L (ref 22–32)
CO2 TOTAL: 26 mmol/L (ref 22–32)
CREATININE: 1.15 mg/dL (ref 0.62–1.27)
CREATININE: 1.2 mg/dL (ref 0.62–1.27)
ESTIMATED GFR: >59 mL/min/1.73mˆ2 (ref >59)
ESTIMATED GFR: >59 mL/min/1.73mˆ2 (ref >59)
GLUCOSE: 163 mg/dL — ABNORMAL HIGH (ref 65–139)
GLUCOSE: 193 mg/dL — ABNORMAL HIGH (ref 65–139)
GLUCOSE: 193 mg/dL — ABNORMAL HIGH (ref 65–139)
POTASSIUM: 3.5 mmol/L (ref 3.5–5.1)
POTASSIUM: 3.2 mmol/L — ABNORMAL LOW (ref 3.5–5.1)
SODIUM: 139 mmol/L (ref 136–145)
SODIUM: 140 mmol/L (ref 136–145)

## 2017-11-16 LAB — ADULT ROUTINE BLOOD CULTURE, SET OF 2 BOTTLES (BACTERIA AND YEAST): BLOOD CULTURE, ROUTINE: NO GROWTH

## 2017-11-16 LAB — PHOSPHORUS
PHOSPHORUS: 2.4 mg/dL (ref 2.3–4.0)
PHOSPHORUS: 1.9 mg/dL — ABNORMAL LOW (ref 2.3–4.0)

## 2017-11-16 LAB — CBC WITH DIFF
HCT: 30.9 % — ABNORMAL LOW (ref 36.7–47.0)
HCT: 30.9 % — ABNORMAL LOW (ref 36.7–47.0)
HGB: 10.4 g/dL — ABNORMAL LOW (ref 12.5–16.3)
MCH: 31.9 pg (ref 27.4–33.0)
MCHC: 33.7 g/dL (ref 32.5–35.8)
MCV: 94.8 fL (ref 78.0–100.0)
MPV: 8.8 fL (ref 7.5–11.5)
PLATELETS: 112 10*3/uL — ABNORMAL LOW (ref 140–450)
RBC: 3.26 x10ˆ6/uL — ABNORMAL LOW (ref 4.06–5.63)
RDW: 17.7 % — ABNORMAL HIGH (ref 12.0–15.0)
WBC: 5.6 x10ˆ3/uL (ref 3.5–11.0)

## 2017-11-16 LAB — PT/INR
INR: 4.16 — ABNORMAL HIGH (ref 0.80–1.20)
PROTHROMBIN TIME: 47.1 seconds — ABNORMAL HIGH (ref 9.5–14.1)

## 2017-11-16 LAB — POC BLOOD GLUCOSE (RESULTS)
GLUCOSE, POC: 163 mg/dl — ABNORMAL HIGH (ref 70–105)
GLUCOSE, POC: 167 mg/dl — ABNORMAL HIGH (ref 70–105)
GLUCOSE, POC: 215 mg/dl — ABNORMAL HIGH (ref 70–105)

## 2017-11-16 LAB — MAGNESIUM: MAGNESIUM: 1.8 mg/dL (ref 1.6–2.5)

## 2017-11-16 MED ORDER — POTASSIUM CHLORIDE ER 20 MEQ TABLET,EXTENDED RELEASE(PART/CRYST)
40.0000 meq | ORAL_TABLET | Freq: Once | ORAL | Status: AC
Start: 2017-11-17 — End: 2017-11-16
  Administered 2017-11-16: 40 meq via ORAL
  Filled 2017-11-16: qty 2

## 2017-11-16 MED ORDER — POTASSIUM CHLORIDE ER 20 MEQ TABLET,EXTENDED RELEASE(PART/CRYST)
40.0000 meq | ORAL_TABLET | ORAL | Status: AC
Start: 2017-11-16 — End: 2017-11-16
  Administered 2017-11-16: 40 meq via ORAL
  Filled 2017-11-16: qty 2

## 2017-11-16 MED ORDER — FUROSEMIDE 10 MG/ML INJECTION SOLUTION
80.0000 mg | Freq: Three times a day (TID) | INTRAMUSCULAR | Status: DC
Start: 2017-11-16 — End: 2017-11-16
  Administered 2017-11-16: 80 mg via INTRAVENOUS
  Filled 2017-11-16: qty 8

## 2017-11-16 MED ORDER — FUROSEMIDE 80 MG TABLET
80.0000 mg | ORAL_TABLET | Freq: Three times a day (TID) | ORAL | Status: DC
Start: 2017-11-16 — End: 2017-11-18
  Administered 2017-11-16 – 2017-11-17 (×4): 80 mg via ORAL
  Filled 2017-11-16 (×7): qty 1

## 2017-11-16 MED ORDER — ATORVASTATIN 80 MG TABLET
80.00 mg | ORAL_TABLET | Freq: Every evening | ORAL | Status: DC
Start: 2017-11-16 — End: 2017-11-22
  Administered 2017-11-16 – 2017-11-21 (×6): 80 mg via ORAL
  Filled 2017-11-16 (×7): qty 1

## 2017-11-16 MED ORDER — PHENOL 1.4 % MUCOSAL AEROSOL SPRAY
1.00 | INHALATION_SPRAY | Status: DC | PRN
Start: 2017-11-16 — End: 2017-11-22
  Filled 2017-11-16: qty 177

## 2017-11-16 MED ORDER — SACUBITRIL 49 MG-VALSARTAN 51 MG TABLET
1.00 | ORAL_TABLET | Freq: Two times a day (BID) | ORAL | Status: DC
Start: 2017-11-16 — End: 2017-11-22
  Administered 2017-11-16 – 2017-11-22 (×12): 1 via ORAL
  Filled 2017-11-16 (×14): qty 1

## 2017-11-16 MED ORDER — ASPIRIN 81 MG CHEWABLE TABLET
81.0000 mg | CHEWABLE_TABLET | Freq: Every day | ORAL | Status: DC
Start: 2017-11-16 — End: 2017-11-22
  Administered 2017-11-16 – 2017-11-22 (×7): 81 mg via ORAL
  Filled 2017-11-16 (×7): qty 1

## 2017-11-16 MED ADMIN — spironolactone 100 mg tablet: @ 13:00:00

## 2017-11-16 MED ADMIN — potassium chloride 20 mEq/15 mL oral liquid: ORAL | @ 13:00:00

## 2017-11-16 MED ADMIN — sodium chloride 0.9 % intravenous solution: SUBCUTANEOUS | @ 16:00:00 | NDC 00338004904

## 2017-11-16 NOTE — Pharmacy (Signed)
[facility] / [facility] [facility]  Therapeutic Drug Monitoring: Warfarin Progress Note    Andrew Arias is a 62 y.o. year old male on warfarin for afib     Goal INR: 2-3    Home dose: 5 mg nightly per patient    Outpatient INR Management: Unknown cardiologist in [place], [place] (will need to follow up)    Inpatient Management: Medicine 1    Concurrent Antiplatelet Agents: aspirin    Inpatient Warfarin Dosing:     Date INR Warfarin Dose   (@ 21:00) Bridging Regimen Drug Interactions Comments   03/17 2.23 5 mg      03/18 2.96 Held      03/19 3.78 Held  leflunomide may increase INR    03/20 4.13 Held      03/21 4.03 Held      03/22 4.16 (Hold)                                Assessment/Plan:  [name] 's INR today is supra-therapeutic    Pharmacist recommendation: Continue to hold warfarin. When warfarin deemed safe to restart, consider dosing very cautiously at a much lower dose. Per patient cannot afford DOACs.  Continue to check INR daily.    Monitoring:  Please continue to monitor PT/INR daily.  Monitor for signs/symptoms of bleeding.   Pharmacist will continue to make daily warfarin recommendations.   Please contact pharmacy with any questions.

## 2017-11-16 NOTE — Care Plan (Signed)
[facility]  Physical Therapy Progress Note      Patient Name: Andrew Arias  Date of Birth: 1955/10/24  Height:  193 cm (6' 3.98")  Weight:  (!) 152 kg (335 lb)  Room/Bed: 805/A  Payor: [facility] / Plan: [facility] / Product Type: MEDICARE [facility] /     Assessment:     Patient stood with min assist. Ambulated 30 feet with [employer] and min assist. Fatigues quickly. Would benefit from inpatient rehab but patient refused. Agreeable for home with home health. Needs to have further progress to be able to go home with home health.    Discharge Needs:   Equipment Recommendation: none anticipated    Discharge Disposition: home with 24/7 assistance, home with home health    JUSTIFICATION OF DISCHARGE RECOMMENDATION   Based on current diagnosis, functional performance prior to admission, and current functional performance, this patient requires continued PT services in home with 24/7 assistance, home with home health in order to achieve significant functional improvements in these deficit areas: muscle performance, gait, locomotion, and balance.      Plan:   Continue to follow patient according to established plan of care.  The risks/benefits of therapy have been discussed with the patient/caregiver and he/she is in agreement with the established plan of care.     Subjective & Objective:        11/16/17 1037   Therapist Pager   PT Assigned/ Pager # [phone]   Rehab Session   Document Type therapy progress note (daily note)   Total PT Minutes: 23   Patient Effort good   Symptoms Noted During/After Treatment fatigue   General Information   Patient Profile Reviewed? yes   Medical Lines Telemetry;PIV Line;Foley Catheter   Respiratory Status room air   Mutuality/Individual Preferences   Patient-Specific Goals (Include Timeframe) Not going to inpatient rehab. Wants home with home health.   Pre Treatment Status   Pre Treatment Patient Status Patient sitting in bedside chair or w/c;Call light  within reach;Telephone within reach;Nurse approved session;Sitter select activated   Support Present Pre Treatment  None   Communication Pre Treatment  Nurse   Pain Assessment   Pre/Post Treatment Pain Comment No C/O pain.   Transfer Assessment/Treatment   Sit-Stand [place] minimum assist (75% patient effort)   Stand-Sit [place] minimum assist (75% patient effort)   Sit-Stand-Sit, Assist Device walker, front wheeled   Gait Assessment/Treatment   [place]ependence  minimum assist (75% patient effort)   Assistive Device  walker, front wheeled   Distance in Feet 30   Balance Skill Training   Standing Balance: Static fair balance   Standing Balance: Dynamic fair - balance   Post Treatment Status   Post Treatment Patient Status Patient sitting in bedside chair or w/c;Sitter select activated;Call light within reach;Telephone within reach   Support Present Post Treatment  None   Communication Post Treatement Nurse   Plan of Care Review   Plan Of Care Reviewed With patient   Physical Therapy Clinical Impression   Assessment Patient stood with min assist. Ambulated 30 feet with [employer] and min assist. Fatigues quickly. Would benefit from inpatient rehab but patient refused. Agreeable for home with home health. Needs to have further progress to be able to go home with home health.   Anticipated Equipment Needs at Discharge (PT) none anticipated   Anticipated Discharge Disposition home with 24/7 assistance;home with home health   Planned Therapy Interventions, PT Eval  Planned Therapy Interventions (PT) balance training;bed mobility training;gait training;transfer training       Therapist:   Carollee Herter, PT   Pager #: [phone]

## 2017-11-16 NOTE — Nurses Notes (Signed)
As per telemetry, patient had some trigeminy. Patient is asymptomatic. Made the service aware. Will continue to monitor.

## 2017-11-16 NOTE — Care Management Notes (Signed)
[facility]  Care Management Note    Patient Name: Andrew Arias  Date of Birth: 1956-07-26  Sex: male  Date/Time of Admission: 11/11/2017  7:20 AM  Room/Bed: 805/A  Payor: [facility] / Plan: [facility] / Product Type: MEDICARE [facility] /    LOS: 5 days   Primary Care Providers:  Pcp, No (General)    Admitting Diagnosis:  UTI (urinary tract infection) [N39.0]    Assessment:      11/16/17 1224   Assessment Details   Assessment Type Continued Assessment   Date of Care Management Update 11/16/17   Date of Next DCP Update 11/19/17   Care Management Plan   Discharge Planning Status plan in progress   Projected Discharge Date 11/16/17   Discharge Needs Assessment   Discharge Facility/Level of Care Needs SNF Placement (Medicare certified)(code 3)       Discharge Plan:  Acute Rehab Placement/Return (not psych) (code 6)  MSW met with pt at bedside regarding PT/OT recommendations. Pt is agreeable to discharge to [facility] with preference for [facility] and [facility]. Referrals sent via [employer]. Per Vaughan Basta with [facility], bed available on Monday, 3/24, pending authorization. Freedom of choice signed by pt and placed on chart. Hospital NPI: [id number] Dr. Petra Kuba NPI: [id number].     The patient will continue to be evaluated for developing discharge needs.     Case Manager: Theodis Aguas  Phone: [phone]

## 2017-11-16 NOTE — Consults (Signed)
[place]  Follow up Note:    Gemayel, Mascio, 62 y.o. male  Date of Service: 11/16/2017  Date of Birth: 12/18/1955  LOS: 5 days  Requesting Provider/Service: MICU 2    Reason for Admission: Heart Failure        Subjective:      No overnight event including CP or new SOB. Pt was doing okay.      CAD hx:  October 2015 he had undergone surgical revascularization having received a LIMA sequenced to the diagonal and LAD and saphenous vein graft to the obtuse marginal and free radial to the PDA of the right coronary artery. Unfortunately, there is no meaningful improvement with regard to his left ventricular function. His last echocardiogram of January 2017 reported an ejection fraction of 15%. This was similar to a study performed in October 2016.        PMH:  Diagnosis   . Essential hypertension, benign   . Pure hypercholesterolemia   . Coronary atherosclerosis of native coronary artery   . Splenomegaly   . Postsurgical percutaneous transluminal coronary angioplasty status   . Cardiomyopathy (Notable Code)   . Dyspnea   . Long term current use of anticoagulant therapy   . Fatigue   . Shortness of breath   . Edema   . Chronic a-fib (Notable Code)   . Ventricular tachycardia seen on cardiac monitor (Notable Code)   . Acute on chronic systolic heart failure (Notable Code)   . Morbid obesity with BMI of 40.0-44.9, adult (Notable Code)   . CABG x 4 --LIMA->D,LIMA->LAD, SVG->OM, RIMA->PDA on 06/15/14 by Dr.Navid   . AICD (automatic cardioverter/defibrillator) present BiV ICD  . Sleep apnea   . Bacteremia   . Osteomyelitis of ankle (Notable Code)   . Pulmonary HTN (Notable Code)   . Decubitus ulcer of right ankle, stage 4 (Notable Code)   . Primary cardiomyopathy (Notable Code)   . Type 2 diabetes mellitus without complication (Notable Code)   . Encounter for long-term (current) use of medications   . CHF exacerbation (Notable Code)   . Screening for thyroid disorder   . Screening,  anemia, deficiency, iron   . Cardiac LV ejection fraction 15% as shown on Echocardiogram of 09-08-2015          Home Meds:  Medication Sig   . allopurinol (ZYLOPRIM) 300 mg oral tablet Take 300 mg by mouth daily   . aspirin (ECOTRIN) 325 mg oral delayed-release tablet Take 325 mg by mouth daily   . bumetanide (BUMEX) 2 mg oral tablet Take by mouth daily 1-2 mg As need   . carvedilol (COREG) 12.5 mg oral tablet Take 12.5 mg by mouth 2 times a day with meals   . colchicine (COLCRYS) 0.6 mg oral tablet Take 0.6 mg by mouth 3 times per week   . cyanocobalamin (VITAMIN B-12) 1,000 mcg oral tablet Take 1,000 micrograms by mouth daily   . digoxin (LANOXIN) 125 mcg oral tablet Take 125 micrograms by mouth daily   . ferrous gluconate 324 mg (37.5 mg iron) oral tablet Take 1 tablet by mouth daily   . folic acid (FOLVITE) 462 mcg oral tablet Take 800 micrograms by mouth daily   . gabapentin (NEURONTIN) 300 mg oral capsule Take 300 mg by mouth Patient is currently taking 300 mg in AM and 600 mg in PM   . insulin detemir (LEVEMIR FLEXPEN) 100 unit/mL (3 mL) subcutaneous flextouch Inject into the skin every  morning and at bedtime 24 units in AM and 52 in PM    . isosorbide mononitrate (IMDUR) 30 mg oral extended-release tablet Take 30 mg by mouth daily   . magnesium oxide (MAG-OX) 400 mg oral tablet Take 400 mg by mouth daily   . potassium bicarbonate (EFFER-K) 25 mEq oral disintegrating tablet Take 25 mEq by mouth as needed   . predniSONE (DELTASONE) 2.5 mg oral tablet Take 2.5 mg by mouth daily   . warfarin (COUMADIN) 5 mg oral tablet TAKE ONE TO TWO TABLETS BY MOUTH DAILY AS DIRECTED BY [facility] DAPS   . docusate sodium (COLACE) 100 mg oral capsule Take 100 mg by mouth daily   . Ferrous Fumarate (FERRETTS) 325 mg (106 mg iron) oral tablet Take 1 tablet by mouth daily   . insulin aspart (NOVOLOG FLEXPEN) 100 unit/mL subcutaneous flexpen Inject into the skin 2 times a day before meals Sliding scale   . leflunomide (ARAVA) 10 mg oral  tablet Take 10 mg by mouth daily   . metOLazone (ZAROXOLYN) 2.5 mg oral tablet Take 2.5 mg by mouth daily As need per kidney MD   . polyethylene glycol (MIRALAX) 17 gram oral packet Take 17 g by mouth daily   . Saccharomyces boulardii (FLORASTOR) 250 mg oral capsule Take 250 mg by mouth daily   . sacubitril-valsartan (ENTRESTO) 24-26 mg oral tablet Take by mouth 2 times a day   . simvastatin (ZOCOR) 20 mg oral tablet Take 20 mg by mouth at bedtime   . tamsulosin (FLOMAX) 0.4 mg oral extended-release capsule Take 0.4 mg by mouth at bedtime           Dyspnea:  Admits:  With moderate exertion   Edema: Admits Lower Extremity Edema: bilaterally  knee(s)  1+   Orthostasis  denies    pillow orthopnea denies    Paroxysmal nocturnal dyspnea denies    right upper quadrant pain denies    abdominal bloating denies    early satiety denies    nausea denies    anorexia denies    syncope denies    presyncope denies    exertional lightheadedness denies    defibrillator firings denies    palpations denies    chest pain Denies   ethanol denies    tobacco denies    Substance use denies     Risk Factors  Family history of heart failure: Yes. mother  Family history of sudden cardiac death: no  Arrhythmia present?No  Renal Failure:Yes,       - Chronic Kidney Disease  (CKD) Stage 3   Pulmonary Hypertension: no  Anemia: Yes,   Acute  Anemia source: blood loss  Malnutrition: No, dietitian consult      Past Medical History:   Diagnosis Date   . A-fib (CMS HCC)     s/p ablation   . Anemia    . Anticoagulant long-term use     Warfarin   . Arthritis    . CAD (coronary artery disease)     s/p CABG   . Cardiac LV ejection fraction 21-30%    . Cardiomyopathy (CMS [place])     EF 25%   . Cellulitis of lower extremity    . CHF (congestive heart failure) (CMS HCC)    . CKD (chronic kidney disease), stage III (CMS HCC)    . Colonic polyp    . Constipation    . DM2 (diabetes mellitus, type 2) (CMS HCC)    . Edema  bilateral lower extremity   . Gout    .  History of cellulitis    . HLD (hyperlipidemia)    . HTN (hypertension)    . Morbid obesity (CMS [place])    . Neuropathy (CMS HCC)     bilateral feet   . Osteomyelitis (CMS [place]) 2015    right ankle and was treated with external fixator for 4 months followed by multiple debridements   . Pacemaker    . Urinary retention    . Use of cane as ambulatory aid     states due to his right ankle- also has a walker and wheelchair           Past Surgical History:   Procedure Laterality Date   . HX CARDIAC ABLATION     . HX COLONOSCOPY     . HX CORONARY ARTERY BYPASS GRAFT     . HX PACEMAKER DEFIBRILLATOR PLACEMENT             Patient Active Problem List   Diagnosis   . UTI (urinary tract infection)   . Morbid obesity   . Septic shock (CMS HCC)       Prior to Admission Medications:  Medications Prior to Admission     Prescriptions    allopurinol (ZYLOPRIM) 300 mg Oral Tablet    Take 300 mg by mouth Once a day    aspirin 81 mg Oral Tablet, Chewable    Take 324 mg by mouth Once a day    bumetanide (BUMEX) 2 mg Oral Tablet    Take 2 mg by mouth Twice daily    carvedilol (COREG) 12.5 mg Oral Tablet    Take 12.5 mg by mouth Twice daily with food    colchicine 0.6 mg Oral Tablet    Take 0.6 mg by mouth Every Monday, Wednesday and Friday    digoxin (LANOXIN) 125 mcg Oral Tablet    Take 0.125 mg by mouth Once a day    gabapentin (NEURONTIN) 300 mg Oral Capsule    Take 300 mg by mouth Three times a day Take 600mg  in the morning, followed by 300mg  in the afternoon, then 600mg  in the evening.    Leflunomide (ARAVA) 20 mg Oral Tablet    Take 20 mg by mouth Once a day    magnesium oxide (MAG-OX) 400 mg (241.3 mg magnesium) Oral Tablet    Take 400 mg by mouth Once a day    metFORMIN (GLUCOPHAGE) 500 mg Oral Tablet    Take 500 mg by mouth Twice daily with food    pioglitazone (ACTOS) 15 mg Oral Tablet    Take 15 mg by mouth Once a day    predniSONE (DELTASONE) 2.5 mg Oral Tablet    Take 2.5 mg by mouth Once a day before lunch     sacubitril-valsartan (ENTRESTO) 49-51 mg Oral Tablet    Take 1 Tab by mouth Twice daily    simvastatin (ZOCOR) 20 mg Oral Tablet    Take 20 mg by mouth Every evening          Past Family History:   Family Medical History:     Problem Relation (Age of Onset)    Arthritis-rheumatoid Father, Brother    Congestive Heart Failure Mother    Coronary Artery Disease Father          Social History  Social History     Substance and Sexual Activity   Alcohol Use Yes   . Frequency: 2-4 times  a month      reports that he quit smoking about 7 years ago. His smoking use included cigarettes. He has never used smokeless tobacco.   reports that he does not use drugs.    ROS: Other than ROS in the HPI, all other systems were negative.      Exam:   Temperature: 36.3 C (97.3 F)  Heart Rate: 73  BP (Non-Invasive): 111/89  Respiratory Rate: 15  SpO2: (!) 85 %  Pain Score (Numeric, Faces): 7    Date 11/16/17 0700 - 11/17/17 0659   Shift 0700-1459 1500-2259 2300-0659 24 Hour Total   INTAKE   Shift Total(mL/kg)       OUTPUT   Urine(mL/kg/hr) 400   400   Shift Total(mL/kg) 400(2.63)   400(2.63)   NET -400   -400   Weight (kg) 151.95 151.95 151.95 151.95       Current Weight:   Patient Vitals for the past 48 hrs:   Weight   11/16/17 0500 (!) 152 kg (335 lb)       General:NAD,appears chronically ill  HENT:NC/AT; oral mucous membranesmoist  Eyes:conjunctivaeclear, non-icteric; PERRLA, L lateral upper lid lesion  Neck:trachea midline  Cardiovascular:RRR; normal I4/P8; holosystolic murmur  Pulmonary / Chest:Normal respiratory effort; CTAB but mild crackles at bilateral bases. NC in place  GI / Abdominal:Morbidly obese, soft, non-distended; BS normal; no TTP  Extremities:bilateral pittingedema;nocyanosis; well-perfused  Skin:Warm and dry, venous stasis dermatitis changes over bilateral legs with hyperpigmentation and some superficial fluid blisters, R>L; wound dressings in place  Neurologic:CN grossly intact. Mentation  improved    LABS:  Results for orders placed or performed during the hospital encounter of 11/11/17 (from the past 24 hour(s))   PT/INR daily X 7   Result Value Ref Range    PROTHROMBIN TIME 47.1 (H) 9.5 - 14.1 seconds    INR 4.16 (H) 0.80 - 1.20    Narrative    Coumadin therapy INR range for Conventional Anticoagulation is 2.0 to 3.0 and for Intensive Anticoagulation 2.5 to 3.5.   CBC/DIFF    Narrative    The following orders were created for panel order CBC/DIFF.  Procedure                               Abnormality         Status                     ---------                               -----------         ------                     CBC WITH KDXI[[id number]]                Abnormal            Final result               MANUAL DIFFERENTIAL (CEL.Marland KitchenMarland Kitchen[[id number]]  Abnormal            Final result                 Please view results for these tests on the individual orders.   BASIC METABOLIC PANEL   Result Value Ref Range    SODIUM 139 136 -  145 mmol/L    POTASSIUM 3.5 3.5 - 5.1 mmol/L    CHLORIDE 105 96 - 111 mmol/L    CO2 TOTAL 25 22 - 32 mmol/L    ANION GAP 9 4 - 13 mmol/L    CALCIUM 8.4 (L) 8.5 - 10.2 mg/dL    GLUCOSE 163 (H) 65 - 139 mg/dL    BUN 62 (H) 8 - 25 mg/dL    CREATININE 1.15 0.62 - 1.27 mg/dL    BUN/CREA RATIO 54 (H) 6 - 22    ESTIMATED GFR >59 >59 mL/min/1.33m^2   MAGNESIUM   Result Value Ref Range    MAGNESIUM 2.2 1.6 - 2.5 mg/dL   PHOSPHORUS   Result Value Ref Range    PHOSPHORUS 2.4 2.3 - 4.0 mg/dL   CBC WITH DIFF   Result Value Ref Range    WBC 5.6 3.5 - 11.0 x10^3/uL    RBC 3.26 (L) 4.06 - 5.63 x10^6/uL    HGB 10.4 (L) 12.5 - 16.3 g/dL    HCT 30.9 (L) 36.7 - 47.0 %    MCV 94.8 78.0 - 100.0 fL    MCH 31.9 27.4 - 33.0 pg    MCHC 33.7 32.5 - 35.8 g/dL    RDW 17.7 (H) 12.0 - 15.0 %    PLATELETS 112 (L) 140 - 450 x10^3/uL    MPV 8.8 7.5 - 11.5 fL   MANUAL DIFFERENTIAL (CELLAVISION)   Result Value Ref Range    METAMYELOCYTES 2 0 - 2 %    MYELOCYTES 1 (H) 0 - 0 %    NRBC # AUTOMATED 1.00 x10^3/uL     ANISOCYTOSIS Slight Slight, Moderate    MACROCYTOSIS Slight Slight, Moderate    NEUTROPHIL % 89 %    LYMPHOCYTE % 4 %    MONOCYTE % 4 %    EOSINOPHIL % 0 %    BASOPHIL % 0 %    NEUTROPHIL # 4.98 1.50 - 7.70 x10^3/uL    LYMPHOCYTE # 0.23 (L) 1.00 - 4.80 x10^3/uL    MONOCYTE # 0.22 (L) 0.30 - 1.00 x10^3/uL    EOSINOPHIL # 0.00 0.00 - 0.50 x10^3/uL    BASOPHIL # 0.00 0.00 - 0.20 x10^3/uL   POC BLOOD GLUCOSE (RESULTS)   Result Value Ref Range    GLUCOSE, POC 243 (H) 70 - 105 mg/dl   POC BLOOD GLUCOSE (RESULTS)   Result Value Ref Range    GLUCOSE, POC 228 (H) 70 - 105 mg/dl   POC BLOOD GLUCOSE (RESULTS)   Result Value Ref Range    GLUCOSE, POC 163 (H) 70 - 105 mg/dl   POC BLOOD GLUCOSE (RESULTS)   Result Value Ref Range    GLUCOSE, POC 167 (H) 70 - 105 mg/dl     Lab Results   Component Value Date    CO2 25 11/16/2017    CO2 25 11/15/2017    BUN 62 (H) 11/16/2017    BUN 66 (H) 11/15/2017    CREATININE 1.15 11/16/2017    CREATININE 1.41 (H) 11/15/2017    WBC 5.6 11/16/2017    WBC 5.8 11/15/2017    HGB 10.4 (L) 11/16/2017    HGB 10.2 (L) 11/15/2017     Lab Results   Component Value Date    AST 36 11/11/2017    AST 35 11/11/2017    ALT 9 11/11/2017    ALT 10 11/11/2017     No results found for: TRIG, HDL  No results found for: TSH  INR (  no units)   Date Value   11/16/2017 4.16 (H)   11/15/2017 4.03 (H)   11/14/2017 4.13 (H)         Additional lab testing ordered: none    Chest X-ray:   11/11/2017  IMPRESSION:  Cardiomegaly without acute process.    TTE 11/12/2017:  Findings:  Left Ventricle: Normal left ventricular size. Severely depressed left ventricular systolic function. LV Ejection Fraction  is 25 %. Normal geometry. Paradoxical septal motion consistent with RV pacemaker.  Resting Segmental Wall Motion Analysis: Total wall motion score is 2.24. There is akinesis of the mid to apical  anterolateral wall. There is akinesis of the apical cap. There is akinesis of the apical inferoseptal wall. The remaining  left ventricular  segments demonstrate hypokinesis.  Right Ventricle: Mildly dilated right ventricle. Mildly depressed right ventricular systolic function. Echo density in  right ventricle suggestive of catheter, pacer lead, or ICD lead.  Left Atrium: The left atrium is normal in size.  Right Atrium: Mildly dilated right atrium.  Atrial Septum: The interatrial septum is normal in appearance.  Mitral Valve: There is moderate secondary mitral regurgitation.  Aortic Valve: Normal aortic valve. No aortic regurgitation seen. Trileaflet aortic valve. No Aortic Valve Stenosis.  Tricuspid Valve: There is moderate tricuspid regurgitation.  Pulmonic Valve: Normal pulmonic valve.  Pericardium: Normal pericardium with no pericardial effusion.  Aorta: Normal aortic root.  IVC: The inferior vena cava is dilated.  Pulmonary Artery: Normal pulmonary artery size.      Heart Failure Therapies:   ARN1/ACE-I/ARB: No: Why? hypotension  Beta Blocker: No: Why? hypotension  Aldosterone antagonist: No: Why? hypotension  Device: Yes   CRT-D    Family Communication:   Code Status:  Code Status Information     Code Status    Full Code            Assessment/Recommendations:  Acute on Chronic Systolic CHF. Requiring Cardiogenic Shock: no / ischemic cardiomyopathy/ Mild right ventricular dysfunction / Clinically mildly fluid overloaded on examination/ ACC/AHA Stage D, NYHA functional classIII    Diagnosis   . Essential hypertension, benign   . Pure hypercholesterolemia   . Coronary atherosclerosis of native coronary artery   . Splenomegaly   . Postsurgical percutaneous transluminal coronary angioplasty status   . Cardiomyopathy (Notable Code)   . Dyspnea   . Long term current use of anticoagulant therapy   . Fatigue   . Shortness of breath   . Edema   . Chronic a-fib (Notable Code)   . Ventricular tachycardia seen on cardiac monitor (Notable Code)   . Acute on chronic systolic heart failure (Notable Code)   . Morbid obesity with BMI of 40.0-44.9, adult (Notable  Code)   . CABG x 4 --LIMA->D,LIMA->LAD, SVG->OM, RIMA->PDA on 06/15/14 by Dr.Navid   . AICD (automatic cardioverter/defibrillator) present   . Sleep apnea   . Bacteremia   . Osteomyelitis of ankle (Notable Code)   . Pulmonary HTN (Notable Code)   . Decubitus ulcer of right ankle, stage 4 (Notable Code)   . Primary cardiomyopathy (Notable Code)   . Type 2 diabetes mellitus without complication (Notable Code)   . Encounter for long-term (current) use of medications   . CHF exacerbation (Notable Code)   . Screening for thyroid disorder   . Screening, anemia, deficiency, iron   . Cardiac LV ejection fraction 15% as shown on Echocardiogram of 09-08-2015    Urosepsis  Supratheraputic INR  AKI over CKD stage III  AF on  warfarin  DM type II    -  Recommended to switch  Bumex 2mg  PO BID home dose to lasix 80 mg TID for couple of days before transitioning to PO Bumex home dose here as pt still need a lot of diuresis here. He had UOP for 24 hrs for 2200 ml, negative 2179 ml and negative 3836 ml since admission here.  - Please do Strict I/O, daily weight, fluid restriction and 2 g sodium diet.  - Recommended Lymphedema consult here if not done yet.  - Please do icd ([employer]) interrogation here if not done yet.  - Agree with adding entresto home dose today and his BP is okay currently.  - Consider adding asa 81 mg daily and atorvastatin 40 mg with him with hx of CAD and DM. Also pt will benefits from SGLT2 I medication here for DM and CHF management  - Pt is on Bumex 2mg  BID, Entresto 49-51 mg BID and Coreg 12.5 mg BID at home.  Please place consult order in [place] if not done yet.    Thank you for allowing [place] to participate in the care of your patient.  If you have any questions please do not hesitate to call.     Holley Bouche, MD      Late entry for 11/16/17. I saw and examined the patient.  I reviewed the fellow's note.  I agree with the findings and plan of care as documented in the fellow's note.  Any exceptions/additions  are edited/noted.      Royston Cowper Kaytelynn Scripter, DO [facility]

## 2017-11-16 NOTE — Care Plan (Addendum)
Lymphedema Therapy      Assessment:   Pt with what appears to be longer standing lymphedema for which he has not gotten treatment even when treatment was recommended years ago with lymphedema pump.  Pt does not feel that his legs are that bad, and feel that once the infection in the R leg clears up he will be fine.  Other contributing factors to swelling include low albumin, DM, recent renal impairment, resolving cellulitis on the R, and limited mobility.  At this point, I do not feel that the potential benefits of compression outweigh the risks- and the pt does not wish to make a commitment that his wife would need to learn wrap his legs long term.        Goals:   1. Pt and/or family will demonstrate knowledge of lymphedema/edema mgmt program including cause(s) of lymphedema/edema, lifestyle modification needs/ modifiable risk factors, current treatments in place to manage lymphedema/edema, and the need to continue lymphedema/edema management long term by d/c.      Discharge Needs:   None at this time with respect to lymphedema    Plan:   Will sign off.      The risks/benefits of therapy have been discussed with the patient/caregiver and he/she is in agreement with the established plan of care.     Therapist:   Gordy Clement, OTR/L, CLT 11/16/2017 15:54  Pager: #[phone]

## 2017-11-16 NOTE — Nurses Notes (Signed)
Spoke with the service, oral potassium 40meq given as ordered. Will continue to monitor.

## 2017-11-16 NOTE — Care Plan (Signed)
[facility]  Occupational Therapy Progress Note    Patient Name: Andrew Arias  Date of Birth: [birth year]  Height:  193 cm (6' 3.98")  Weight:  (!) 152 kg (335 lb)  Room/Bed: 805/A  Payor: [place] / Plan: [facility] / Product Type: MEDICARE [facility] /     Assessment:    Pt tolerated OT contsuly fairly well. Pt requiring Min A x1-2 for STS transfers & fxnl mobility less than household distances with use of FWW & chair follow for safety. Pt requiring DEP A for LB dressing & CGA for standing grooming. Pt easily fatigued & continues to demonstrate decreased strength. Will continue to follow pt. Would recommend inpatient rehab when pt medically appropriate for d/c. If pt declines would recommend home with 24/7 assist & [place] OT.      Discharge Needs:   Equipment Recommendation: bariatric, tub bench    The patient presents with mobility limitations due to impaired balance and impaired functional activity tolerance that significantly impair/prevent patient's ability to participate in mobility-related activities of daily living (MRADLs) including  bathing. This functional mobility deficit can be sufficiently resolved with the use of a bariatric, tub bench   in order to decrease the risk of falls, morbidity, and mortality in performance of these MRADLs.  Patient is able to safely use this assistive device.    Discharge Disposition:  inpatient rehabilitation facility(see assessment for home if declines)   The above recommendation is based upon the current examination and evaluation performed on this date. As subsequent sessions are completed, recommendations will be updated accordingly.    JUSTIFICATION OF DISCHARGE RECOMMENDATION   Based on current diagnosis, functional performance prior to admission, and current functional performance, this patient requires continued OT services in inpatient rehabilitation facility(see assessment for home if declines) in order to achieve significant  functional improvements.    Plan:   Continue to follow patient according to established plan of care.  The risks/benefits of therapy have been discussed with the patient/caregiver and he/she is in agreement with the established plan of care.     Subjective & Objective:        11/16/17 1043   Therapist Pager   OT Assigned/ Pager # Avia Merkley [phone]   Rehab Session   Document Type therapy progress note (daily note)   Total OT Minutes: 23   Patient Effort good   Symptoms Noted During/After Treatment fatigue   General Information   Patient Profile Reviewed? yes   Medical Lines Telemetry;PIV Line;Foley Catheter   Respiratory Status room air   Existing Precautions/Restrictions full code;fall precautions   Pre Treatment Status   Pre Treatment Patient Status Patient sitting in bedside chair or w/c;Call light within reach;Telephone within reach;Sitter select activated;Nurse approved session   Support Present Pre Treatment  None   Communication Pre Treatment  Nurse   Communication Pre Treatment Comment Pt/RN agreeable to OT consult, seen with professional assist of PT   Mutuality/Individual Preferences   Individualized Care Needs OOB sara stedy A x2    Patient-Specific Goals (Include Timeframe) pt does not want to go to rehab. pt perfers to go home with [facility]    Self-Care   Current Activity Tolerance moderate   Pain Assessment   Pre/Post Treatment Pain Comment no c/o pain   Coping/Psychosocial Response Interventions   Plan Of Care Reviewed With patient   Cognitive Assessment/Interventions   Behavior/Mood Observations alert;cooperative   Attention WNL/WFL   Follows Commands WNL  Mobility Assessment/Training   Mobility Comment fxnl mobility 21ft with Min A & use of FWW, chair follow for safety .   Bed Mobility Assessment/Treatment   Supine-Sit Independence not tested   Sit to Supine, Independence not tested   Transfer Assessment/Treatment   Sit-Stand Independence minimum assist (75% patient effort);2 person assist required   Stand-Sit  Independence minimum assist (75% patient effort);2 person assist required   Sit-Stand-Sit, Assist Device walker, front wheeled   Transfer Safety Issues balance decreased during turns;sequencing ability decreased;step length decreased;weight-shifting ability decreased   Transfer Impairments endurance;strength decreased;postural control impaired   Lower Body Dressing Assessment/Training   Comment Dep A donn socks, pt declined to attempt d/t pain   Grooming Assessment/Training   Position standing   Independence Level contact guard assist   Comment washed face at sink with BUE support    Balance Skill Training   Sitting Balance: Static fair + balance   Sit-to-Stand Balance fair balance   Standing Balance: Static fair balance   Standing Balance: Dynamic fair - balance   Post Treatment Status   Post Treatment Patient Status Patient sitting in bedside chair or w/c;Call light within reach;Telephone within reach;Sitter select activated   Support Present Press photographer Nurse   Occupational Therapy Clinical Impression   Functional Level at Time of Session Pt tolerated OT contsuly fairly well. Pt requiring Min A x1-2 for STS transfers & fxnl mobility less than household distances with use of FWW & chair follow for safety. Pt requiring DEP A for LB dressing & CGA for standing grooming. Pt easily fatigued & continues to demonstrate decreased strength. Will continue to follow pt. Would recommend inpatient rehab when pt medically appropriate for d/c. If pt declines would recommend home with 24/7 assist & [place] OT.   Anticipated Equipment Needs at Discharge bariatric;tub bench   Anticipated Discharge Disposition inpatient rehabilitation facility  (see assessment for home if declines)       Therapist:   Sabino Snipes, MOT, OTR/L   Pager #: [phone]

## 2017-11-16 NOTE — Progress Notes (Signed)
INTERNAL MEDICINE  Progress Note     Name: Thadius, Smisek, 62 y.o. male Date of Service: 11/16/2017   TKW:[id number] LOS: 5   PCP:No Pcp Attending: Lurene Shadow, MD   Code Status: Full Code Admitted for: Septic shock (CMS [facility])     SUBJECTIVE:      Patient sitting comfortably upright in chair morning.  He says that he has no complaints at this time.  He denies any fevers, chills, chest pain, pressure, nausea, vomiting or diarrhea.  He endorses continued discomfort in his throat, but says that his nystatin mouthwash helped.  He denies any worsening shortness of breath or change in his lower extremity swelling.     OBJECTIVE:    EXAMINATION:  Temperature: 36.4 C (97.5 F) BP (Non-Invasive): 111/89 Heart Rate: 73   Respiratory Rate: 15 SpO2: (!) 85 % Weight: (!) 152 kg (335 lb)          Intake/Output Summary (Last 24 hours) at 11/16/2017 0855  Last data filed at 11/16/2017 0600  Gross per 24 hour   Intake 480 ml   Output 2860 ml   Net -2380 ml     Last Bowel Movement: 11/15/17     General: Well-nourished, well-developed, in NAD.  HEENT: NC/AT, EOMI, Oropharynx clear, mucous membranes moist, Trachea midline.  Cardiovascular:  Systolic murmur; no rubs, gallops.  Respiratory: CTABL  Abdominal: Bowel sounds normal; abdomen soft, non-tender to palpation  Extremities/Skin:  Significant lower extremity right greater than left edema. Warm and dry.  Neurological: Awake, A&O x 3.     LABS:    CBC Differential   Recent Labs     11/14/17  0243 11/15/17  0521 11/16/17  0442   WBC 13.8* 5.8 5.6   HGB 11.0* 10.2* 10.4*   HCT 33.6* 30.4* 30.9*   PLTCNT 86* 79* 112*    Recent Labs     11/15/17  0521 11/16/17  0442   PMNS 89 89   LYMPHOCYTES 4 4   MONOCYTES 8 4   EOSINOPHIL 0 0   BASOPHILS 0  0.02 0  0.00   PMNABS 5.13 4.98   LYMPHSABS 0.20* 0.23*   MONOSABS 0.44 0.22*   EOSABS 0.01 0.00      BMP LFTs   Recent Labs     11/16/17  0442   SODIUM 139   POTASSIUM 3.5   CHLORIDE 105   CO2 25   BUN 62*   CREATININE 1.15   GLUCOSENF 163*      ANIONGAP 9   BUNCRRATIO 54*   GFR >59   CALCIUM 8.4*   MAGNESIUM 2.2   PHOSPHORUS 2.4    No results found for this encounter   CoAgs Blood Gas:   Recent Labs     11/16/17  0442   PROTHROMTME 47.1*   INR 4.16*    No results found for this encounter    Cardiac Markers Lipid Panel   Recent Labs     11/13/17  2314 11/14/17  0240   TROPONINI 17 13    No results found for this encounter     IMAGING STUDIES:         PATHOLOGY & MICROBIOLOGY:    No results found for any visits on 11/11/17 (from the past 24 hour(s)).    INPATIENT MEDICATIONS:    Current Facility-Administered Medications:  acetaminophen (TYLENOL) tablet 650 mg Oral Q4H PRN   bumetanide (BUMEX) tablet 2 mg Oral 2x/day   cefTRIAXone (ROCEPHIN) 1 g in iso-osmotic  50 mL premix IVPB 1 g Intravenous Q24H   collagenase (SANTYL) 250 unit/gm ointment  Apply Topically Daily   digoxin (LANOXIN) tablet 125 mcg Oral Daily   docusate sodium (COLACE) capsule 100 mg Oral 2x/day   insulin glargine (LANTUS) 100 units/mL injection 25 Units Subcutaneous Daily   NS flush syringe 2 mL Intracatheter Q8HRS   And      NS flush syringe 2-6 mL Intracatheter Q1 MIN PRN   nystatin (MYCOSTATIN) 100,000 units per mL oral liquid 5 mL Swish & Spit 4x/day   nystatin (NYSTOP) 100,000 units/g topical powder  Apply Topically 2x/day   ondansetron (ZOFRAN) 2 mg/mL injection 4 mg Intravenous Q8H PRN   predniSONE (DELTASONE) tablet 2.5 mg Oral Daily with Breakfast   sacubitril-valsartan (ENTRESTO) 49-51 mg per tablet 1 Tab Oral 2x/day   sennosides-docusate sodium (SENOKOT-S) 8.6-50mg  per tablet 1 Tab Oral 2x/day   simvastatin (ZOCOR) tablet 20 mg Oral QPM   SSIP insulin lispro (HUMALOG) 100 units/mL injection 4-12 Units Subcutaneous 4x/day PRN        ASSESSMENT:     CHF, uncontrolled DM 2, atrial fibrillation status post ablation on warfarin for anticoagulation, paced with AICD, CKD 3, gout, and RA who presented originally from [facility] for shock, now as a MICU transfer to the medicine  service.    Active Hospital Problems    Diagnosis   . Primary Problem: Septic shock (CMS HCC)   . UTI (urinary tract infection)        PLAN:   Shock, most likely combined septic/cardiogenic secondary to UTI/pyelonephritis versus lower extremity cellulitis  - blood cultures, urine cultures no growth today.  -urology consult to; appreciate recommendations.  - blood Cx 3/17: ngtd, blood Cx 3/18: ngtd, urine Cx 3/17: ngtd, MRSA negative, C diff negative  - right upper quadrant ultrasound on 11/13/2017 demonstrated partially distended gallbladder with gallstones and sludge.  Gallbladder wall is thickened measuring up to 0.6 cm, likely secondary to partial distended gallbladder or underlying chronic process.  Trace pericholecystic fluid.  -patient initially treated with linezolid, cefepime; de-escalated to Rocephin for total 7 day course on 11/13/2017, with stop date of 11/17/2017.   -continue empiric treatment with ceftriaxone.  -Levophed discontinued on 11/14/2016.  -continue weaning stress dose steroids to baseline prednisone 2.5 mg (patient takes for RA on outpatient basis).  Patient initiated on stress dose steroids on 11/11/2016; receiving last dose tonight.  Set to receive 2.5 mg prednisone standard dose tomorrow morning.     HFrEF  A-fib on warfarin  supratherapeutic INR  cardiogenic shock:  Status/DDx:No baseline O2 requirement. TTE results and decreased UOP off low dose levophed are concerning for possible cardiorenal syndrome  Workup:  - cardiology EPS consulted; pacer functioning correctly  - TTE; EF 25% with multiple regions of marked akinesis/hypokinesis  - cardiology HF consulted; appreciate recs  Tx:  - tele  - monitor INR  - hold warfarin  - continue digoxin  - lasix 40 IV x1, monitor UOP  - fluid restriction 2L, daily weight  -follow-up Cardiology heart failure recommendations, restart home heart failure medications(see chronic problem list in progress)with hemodynamically  appropriate  -patient restarted on home Bumex today; continue to hold Coreg (due to element of cardiogenic shock/acute CHF exacerbation)   -restarted Entresta overnight per Cardiology recs.  -lymphedema consult ordered; appreciate recs.  -will consider adding back Coreg/Entresta based on cardiology HF recommendation.    Bladder dome lesion:  - CT renal calculus protocol and cystogram reviewed  - urology consulted  -  s/p uretheral stricture dilation  - no additional imaging requiring  - pt will need f/u cystoscopy outpatient; urology has referred (3 week follow-up; orders placed in epic)  - pt to be d/c with foley    AKI on CKD III (improving):  - likely 2/2 septic shock; per report, baseline Cr ~1.6; was 3.6 at OSH  - urine lytes suggest pre-renal etioogy  - strict I/Os  -creatinine 1.41 at time of transfer.  -will continue to monitor with daily BMPs.    Probable left psoas hematoma:  - noted on CT 3/18, asymptomatic  - no hx recent trauma; likely 2/2 supratherapeutic INR  -will monitor.    Lymphedema  lower extremity wounds:  - wound careand lymphedema OTconsulted; appreciate recs    DM2, uncontrolled:  - A1c 8.2  - hold home PO agents  - SSI  -increaselantusto 20 from10 u qhs  - holding gabapentin given AMS on presentation and AKI      Chronic medical problems:  - CAD s/p CABG  HTN  HLD: hold coreg, entresto (non-formulary), statin  - gout: hold allopurinol, colchicine  - BPH: hold tamsulosin  - Hypomagnesemia: hold PO supplement, replace prn  - XK:[id number], holdleflunamide    ____________________________________________________________________    Prophylaxis:  - DVT: held d/t INR  - GI: Not indicated  Analgesia:None  Nutrition:Diabetic and Cardiac  Therapy: OT and PT  Bowel regimen:senna, colace          Joshua C. Lurline Hare MD, PhD   PGY1, Transitional Year Resident   Pager [phone]        I saw and examined the patient.  I reviewed the resident's note.  I agree with the  findings and plan of care as documented in the resident's note.  Any exceptions/additions are edited/noted.    Lurene Shadow, MD

## 2017-11-16 NOTE — Care Plan (Signed)
Patient admitted with septic shock and UTI.  Patient on fall and skin precautions.  Alert and oriented X4, educated to call dont fall and verbalized understanding, BA on and in place.  Foley in place draining clear yellow urine.  Patient on hercules bed and educated to frequently reposition while in bed, refused staff help.  IVABX/IV steroids continued.  Tolerating diet and 2027ml fluid restrictions.  BIPAP uses for a small amount during the night, 2LNC while sleeping.  Right lower extremity wound dressing changes BID per orders.  Will continue to monitor labs, VS, and FS.  Dorna Bloom, RN  11/16/2017, 04:14    Problem: Adult Inpatient Plan of Care  Goal: Plan of Care Review  Outcome: Ongoing (see interventions/notes)  Flowsheets (Taken 11/16/2017 0408)  Plan of Care Reviewed With: patient  Progress: improving  Goal: Patient-Specific Goal (Individualization)  Outcome: Ongoing (see interventions/notes)  Flowsheets (Taken 11/16/2017 0408)  Individualized Care Needs: OOB w/sarastedy and 2 assist.  Foley continued.  IVABX and IV steroids continued.  Diet and fluid restrictions followed.  Right lower leg wound dressing changed BID.  Anxieties, Fears or Concerns: "How do I get a new BIPAP for home when my insurance wont pay for it?"  Patient-Specific Goals (Include Timeframe): To be able to walk around easier.  Goal: Absence of Hospital-Acquired Illness or Injury  Outcome: Ongoing (see interventions/notes)  Goal: Optimal Comfort and Wellbeing  Outcome: Ongoing (see interventions/notes)  Goal: Rounds/Family Conference  Outcome: Ongoing (see interventions/notes)     Problem: Fall Injury Risk  Goal: Absence of Fall and Fall-Related Injury  Outcome: Ongoing (see interventions/notes)     Problem: Skin Injury Risk Increased  Goal: Skin Health and Integrity  Outcome: Ongoing (see interventions/notes)     Problem: Acute Rehab Services Goal & Intervention Plan  Goal: Bathing Goal  Description  Stand Alone Therapy Goal  Outcome:  Ongoing (see interventions/notes)  Goal: Bed Mobility Goal  Description  Stand Alone Therapy Goal  Outcome: Ongoing (see interventions/notes)  Goal: Caregiver Training Goal  Description  Stand Alone Therapy Goal  Outcome: Ongoing (see interventions/notes)  Goal: Cognition Goal  Description  Stand Alone Therapy Goal  Outcome: Ongoing (see interventions/notes)  Goal: Cognition Goals, SLP  Description  Stand Alone Therapy Goal  Outcome: Ongoing (see interventions/notes)  Goal: Communication Goals, SLP  Description  Stand Alone Therapy Goal  Outcome: Ongoing (see interventions/notes)  Goal: Dysphagia Goals, SLP  Description  Stand Alone Therapy Goal  Outcome: Ongoing (see interventions/notes)  Goal: Eating Self-Feeding Goal  Description  Stand Alone Therapy Goal  Outcome: Ongoing (see interventions/notes)  Goal: Gait Training Goal  Description  Stand Alone Therapy Goal  Outcome: Ongoing (see interventions/notes)  Goal: Grooming Goal  Description  Stand Alone Therapy Goal  Outcome: Ongoing (see interventions/notes)  Goal: Home Management Goal  Description  Stand Alone Therapy Goal  Outcome: Ongoing (see interventions/notes)  Goal: Interprofessional Goal  Description  Stand Alone Therapy Goal  Outcome: Ongoing (see interventions/notes)  Goal: LB Dressing Goal  Description  Stand Alone Therapy Goal  Outcome: Ongoing (see interventions/notes)  Goal: Occupational Therapy Goals  Description  Stand Alone Therapy Goal  Outcome: Ongoing (see interventions/notes)  Goal: Physical Therapy Goal  Description  Stand Alone Therapy Goal  Outcome: Ongoing (see interventions/notes)  Goal: Range of Motion Goal  Description  Stand Alone Therapy Goal  Outcome: Ongoing (see interventions/notes)  Goal: Strength Goal  Description  Stand Alone Therapy Goal  Outcome: Ongoing (see interventions/notes)  Goal: Toileting Goal  Description  Stand Alone Therapy Goal  Outcome: Ongoing (see interventions/notes)  Goal: Goal Transfer  Training  Description  Stand Alone Therapy Goal  Outcome: Ongoing (see interventions/notes)  Goal: UB Dressing Goal  Description  Stand Alone Therapy Goal  Outcome: Ongoing (see interventions/notes)

## 2017-11-16 NOTE — Ancillary Notes (Addendum)
Diabetes Education    Stopped to assess diabetes education needs. The patient states that he has had diabetes for about 25 years. Patient has rheumatoid arthritis and takes Prednisone.    The patient states that he takes Levemir (26 units in the morning, 52 units in the evening), Humalog per sliding scale at mealtimes, Actos, and Metformin. The patient verbalized understanding of medication regimen. He states that he doesn't always use his sliding scale. He explains that due to high copay costs for Humalog and test strips, he tries to use it less often. When asked, he states his PCP has given him samples of different insulins, but they haven't found a more affordable regimen for him to switch to.  Patient may benefit from [facility] consult while inpatient, to assess if patient would be a good candidate for NPH/Regular insulin regimen. Encouraged patient to discuss possibility of NPH/R insulin with PCP as well. He also shares that he was told fairly recently that he has too much income to get Medicaid. He states he is on disability and sometimes has to decide if he will get all of his medications or pay his utilities and buy food.    The patient stated that he has a meter and tests glucose 1-2 times a day at home. He states that he knows he is supposed to test 3-4 times daily but he doesn't even try to, due to the cost. Current A1c:    Lab Results   Component Value Date    HA1C 8.2 (H) 11/12/2017       The patient denies issues with obtaining testing supplies or medications. We reviewed hypoglycemia; reports no recent episodes. We reviewed progression of disease, ADA BG targets and the importance of testing and avoidance of complications by managing blood sugars. The patient shared that he follows with his PCP for diabetes. Answered all questions. Verbalized understanding. Contact information offered.    Candy Sledge, RDLD  11/16/2017, 14:53

## 2017-11-16 NOTE — OT Evaluation (Addendum)
[facility]  Rehabilitation Services  Lymphedema Therapy Initial Evaluation      Patient Name: Andrew Arias  Date of Birth: August 18, 1956  Height:  Height: 193 cm (6' 3.98")  Weight:  Weight: (!) 152 kg (335 lb)  Room/Bed: 805/A       Date/Time of Admission: 11/11/2017  7:20 AM  Admitting Diagnosis: UTI (urinary tract infection) [N39.0]      HPI:   Andrew Arias is a 62 y.o. male with CAD s/p CABG, CHF, DM2, A-fib s/p ablation on warfarin, paced with AICD, CKD III, and BPH who presents with septic shock likely from a UTI/cystitis.  Pt is oriented but lethargic and a poor historian, so the majority of hx is obtained from records review as pt was transferred from [place] presented to [facility] yesterday d/t AMS, fatigue, and anorexia with decreased PO intake for 2-3 days prior.  Pt cannot recall events of going to the hospital or transfer here.  Denies hx of recent dysuria or polyuria, F/C.  ROS notable only for SOB, but pt states this is unchanged from baseline.  Appeared to have urinary retention with 300 cc on bladder scan and straight cath was difficult to pass, producing minimal prurulent fluid.  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  CT head for AMS was unremarkable.  Pt received one dose each of vancomycin and zosyn at the OSH and 2L fluid bolus.  He began having further confusion and was hypotensive requiring levophed support.  ECG at OSH also notable for possible pacemaker failure, but the pt had no cardiac symptoms.  He was transferred on 100 ml/hr mIVF but no total given was reported; central line placed prior to transfer.      Precautions: fall, arterial disease, open wounds, reduced ejection fraction    Past Medical History:   Diagnosis Date   . A-fib (CMS HCC)     s/p ablation   . Anemia    . Anticoagulant long-term use     Warfarin   . Arthritis    . CAD (coronary artery disease)     s/p CABG   .  Cardiac LV ejection fraction 21-30%    . Cardiomyopathy (CMS [place])     EF 25%   . Cellulitis of lower extremity    . CHF (congestive heart failure) (CMS HCC)    . CKD (chronic kidney disease), stage III (CMS HCC)    . Colonic polyp    . Constipation    . DM2 (diabetes mellitus, type 2) (CMS HCC)    . Edema     bilateral lower extremity   . Gout    . History of cellulitis    . HLD (hyperlipidemia)    . HTN (hypertension)    . Morbid obesity (CMS [place])    . Neuropathy (CMS HCC)     bilateral feet   . Osteomyelitis (CMS [place]) 2015    right ankle and was treated with external fixator for 4 months followed by multiple debridements   . Pacemaker    . Urinary retention    . Use of cane as ambulatory aid     states due to his right ankle- also has a walker and wheelchair         Past Surgical History:   Procedure Laterality Date   . HX CARDIAC ABLATION     . HX COLONOSCOPY     . HX  CORONARY ARTERY BYPASS GRAFT     . HX PACEMAKER DEFIBRILLATOR PLACEMENT           Social History     Tobacco Use   Smoking Status Former Smoker   . Types: Cigarettes   . Last attempt to quit: 2012   . Years since quitting: 7.2   Smokeless Tobacco Never Used       Subjective:   "I can't afford my insulin- they say I make too much."      Objective:   Pt seen at b/s for lymphedema therapy eval and intervention.  Pt OOB to chair with LEs in dependent position.  Large amount of gauze in place to R calf which evidence of extensive soaking through of drainage from the anterior calf.  Pt with high volume, firm quality swelling in the R calf and softer, lower volume swelling in the L.  Discoloration to distal LEs consistent with chronic vascular disease.    Pain: some tenderness to the R calf which the pt endorses feeling better than prior  Hx of edema/lymphedema: Pt indicates both legs were at their usual baseline which is how the L LE is today (which he does not feel is very swollen) until just prior to admission when the pt developed cellulitis  Hx prior  edema/lymphedema management: none- pt reports he was prescribed a lymphedema pump that he paid $3,000 for 3 years ago and never used because he didn't really think he needed it.  He said that he cannot get compression stockings on.    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  Also a R lateral malleolus wound noted on wound care consult.  Sensation: absent  Admission weight: 358 lb 4 oz     Current weight: same  Circumferential measurements taken to monitor progress: deferred due to no plan to intervene with compression at this time  Imaging and labs reviewed with pertinent findings including: Cr 1.41, GFR 51, alb 2.6, HgbA1C 8.2, pulse volume recordings show moderate arterial occlusive disease B LEs and toe pressures < 50 mmHg indicative of poor wound healing potential.  Other: (+) Stemmer sign, skin changes including hyperkeratosis, wounds, fibrosis consistent with late stage lymphedema.  Pt prefers not to elevate his LEs noting this tends to cause him pain shortly after they are elevated.  Pt reports his wife assists him now with some adls and would prefer for her not to have to do any more than she already does for him.      Education: instructed pt in pupose of lymphedema therapy consult and barriers to treatment.  Extensive discussion/education with the pt re: mgmt of other risk factors contributing to swelling/wounds including managing blood sugars.  Pt indicates to me that he cannot afford insulin the way it is ordered and that he has to ration it.  He indicates that his doctor's have been made aware of this.  Discussed low albumin and healthy diet that may support this and pt indicates he does not  really like meat or proteins in general, and that he eats mostly pasta and bread.  When I attempted to assist the pt in seeing a potential correlation between diet choices and blood sugar  the pt seemed perturbed, to lack insight, and he perseverated on not being able to afford the insulin.    Patient's Goal: not stated  Treatment today: education    Assessment:   Pt with what appears to be longer standing lymphedema for which he has not gotten treatment even when treatment was recommended years ago with lymphedema pump.  Pt does not feel that his legs are that bad, and feel that once the infection in the R leg clears up he will be fine.  Other contributing factors to swelling include low albumin, DM, recent renal impairment, resolving cellulitis on the R, and limited mobility.  At this point, I do not feel that the potential benefits of compression outweigh the risks- and the pt does not wish to make a commitment that his wife would need to learn wrap his legs long term.        Goals:   1. Pt and/or family will demonstrate knowledge of lymphedema/edema mgmt program including cause(s) of lymphedema/edema, lifestyle modification needs/ modifiable risk factors, current treatments in place to manage lymphedema/edema, and the need to continue lymphedema/edema management long term by d/c.      Discharge Needs:   None at this time with respect to lymphedema    Plan:   Will sign off.      The risks/benefits of therapy have been discussed with the patient/caregiver and he/she is in agreement with the established plan of care.     Therapist:   Gordy Clement, OTR/L, CLT 11/16/2017 15:54  Pager: #[phone]  Evaluation Time: 40 minutes; Treatment time: 15 minutes  Eval complexity: moderate  Time may include review of medical chart, obtaining patient's functional history from patient/family/medical staff/case management/ancillary personnel, collaboration on findings and treatment options (with the above mentioned individuals),  re-assessment, and acute care rehabilitation.

## 2017-11-17 LAB — PT/INR: INR: 3.85 — ABNORMAL HIGH (ref 0.80–1.20)

## 2017-11-17 LAB — BASIC METABOLIC PANEL
ANION GAP: 9 mmol/L (ref 4–13)
ANION GAP: 11 mmol/L (ref 4–13)
BUN/CREA RATIO: 46 — ABNORMAL HIGH (ref 6–22)
BUN/CREA RATIO: 42 — ABNORMAL HIGH (ref 6–22)
BUN: 53 mg/dL — ABNORMAL HIGH (ref 8–25)
BUN: 49 mg/dL — ABNORMAL HIGH (ref 8–25)
BUN: 49 mg/dL — ABNORMAL HIGH (ref 8–25)
CALCIUM: 8.3 mg/dL — ABNORMAL LOW (ref 8.5–10.2)
CALCIUM: 8.2 mg/dL — ABNORMAL LOW (ref 8.5–10.2)
CHLORIDE: 103 mmol/L (ref 96–111)
CHLORIDE: 100 mmol/L (ref 96–111)
CO2 TOTAL: 30 mmol/L (ref 22–32)
CO2 TOTAL: 28 mmol/L (ref 22–32)
CREATININE: 1.16 mg/dL (ref 0.62–1.27)
CREATININE: 1.18 mg/dL (ref 0.62–1.27)
ESTIMATED GFR: >59 mL/min/1.73mˆ2 (ref >59)
ESTIMATED GFR: >59 mL/min/1.73mˆ2 (ref >59)
GLUCOSE: 112 mg/dL (ref 65–139)
GLUCOSE: 162 mg/dL — ABNORMAL HIGH (ref 65–139)
POTASSIUM: 3.5 mmol/L (ref 3.5–5.1)
POTASSIUM: 3.5 mmol/L (ref 3.5–5.1)
SODIUM: 142 mmol/L (ref 136–145)
SODIUM: 139 mmol/L (ref 136–145)

## 2017-11-17 LAB — CBC WITH DIFF
BASOPHIL #: 0.06 x10ˆ3/uL (ref 0.00–0.20)
BASOPHIL %: 1 %
EOSINOPHIL #: 0.15 x10ˆ3/uL (ref 0.00–0.50)
EOSINOPHIL %: 2 %
HCT: 34.6 % — ABNORMAL LOW (ref 36.7–47.0)
HCT: 34.6 % — ABNORMAL LOW (ref 36.7–47.0)
HGB: 11.4 g/dL — ABNORMAL LOW (ref 12.5–16.3)
LYMPHOCYTE #: 0.5 x10ˆ3/uL — ABNORMAL LOW (ref 1.00–4.80)
LYMPHOCYTE %: 7 %
MCH: 31.6 pg (ref 27.4–33.0)
MCH: 31.6 pg (ref 27.4–33.0)
MCHC: 33 g/dL (ref 32.5–35.8)
MCHC: 33 g/dL (ref 32.5–35.8)
MCV: 95.7 fL (ref 78.0–100.0)
MONOCYTE #: 0.91 10*3/uL (ref 0.30–1.00)
MONOCYTE %: 12 %
MPV: 8.3 fL (ref 7.5–11.5)
NEUTROPHIL #: 6.17 10*3/uL (ref 1.50–7.70)
NEUTROPHIL %: 79 %
NEUTROPHIL %: 79 %
PLATELETS: 187 10*3/uL (ref 140–450)
RBC: 3.61 x10ˆ6/uL — ABNORMAL LOW (ref 4.06–5.63)
RDW: 18.2 % — ABNORMAL HIGH (ref 12.0–15.0)
WBC: 7.8 10*3/uL (ref 3.5–11.0)

## 2017-11-17 LAB — PHOSPHORUS: PHOSPHORUS: 2.4 mg/dL (ref 2.3–4.0)

## 2017-11-17 LAB — MAGNESIUM: MAGNESIUM: 1.8 mg/dL (ref 1.6–2.5)

## 2017-11-17 MED ORDER — INSULIN NPH ISOPHANE U-100 HUMAN 100 UNIT/ML SUBCUTANEOUS SUSPENSION
10.0000 [IU] | Freq: Two times a day (BID) | SUBCUTANEOUS | Status: DC
  Administered 2017-11-17 – 2017-11-21 (×9): 10 [IU] via SUBCUTANEOUS
  Administered 2017-11-22: 0 [IU] via SUBCUTANEOUS
  Filled 2017-11-17: qty 300

## 2017-11-17 MED ORDER — POTASSIUM BICARBONATE-CITRIC ACID 20 MEQ EFFERVESCENT TABLET
40.0000 meq | EFFERVESCENT_TABLET | ORAL | Status: AC
Start: 2017-11-17 — End: 2017-11-17
  Administered 2017-11-17: 40 meq via ORAL
  Filled 2017-11-17: qty 2

## 2017-11-17 MED ADMIN — erythromycin 5 mg/gram (0.5 %) eye ointment: ORAL | @ 20:00:00 | NDC 17478082401

## 2017-11-17 MED ADMIN — sodium chloride 0.9 % intravenous solution: @ 14:00:00 | NDC 00338004904

## 2017-11-17 MED ADMIN — oxyCODONE 5 mg tablet: ORAL | @ 08:00:00

## 2017-11-17 MED ADMIN — nystatin 100,000 unit/gram topical powder: ORAL | @ 08:00:00 | NDC 68308015215

## 2017-11-17 MED ADMIN — sodium chloride 0.9 % (flush) injection syringe: @ 22:00:00

## 2017-11-17 MED ADMIN — heparin lock flush (porcine) 10 unit/mL intravenous solution: ORAL | @ 20:00:00

## 2017-11-17 MED ADMIN — lactated Ringers intravenous solution: SUBCUTANEOUS | @ 19:00:00 | NDC 00338011704

## 2017-11-17 NOTE — Progress Notes (Signed)
HF brief. See forthcoming full note.    Remains grossly hypervolemic.    Plan - continue diuresis, goal negative another 3 L next 24 hrs, if achievable on PO that is fine but anticipate need for continued IV diuresis.    Lubertha Sayres, DO

## 2017-11-17 NOTE — Nurses Notes (Signed)
Results for KAYLOB, WALLEN (MRN [id number]) as of 11/17/2017 05:50   Ref. Range 11/17/2017 04:57   PROTHROMBIN TIME Latest Ref Range: 9.5 - 14.1 seconds 43.7 (H)   INR Latest Ref Range: 0.80 - 1.20  3.85 (H)   Service made aware. Will continue to monitor.

## 2017-11-17 NOTE — Nurses Notes (Signed)
As per telemetry staff, patient had 7 beats run of vetac. Asymptomatic. Patient's denies any chest pain nor SOB. O2 saturation: 97%. Text paged the service. Will continue to monitor.

## 2017-11-17 NOTE — Pharmacy (Signed)
[place] / [facility] of Pharmaceutical Services  Therapeutic Drug Monitoring: Warfarin Progress Note    Andrew Arias is a 62 y.o. year old male on warfarin for afib     Goal INR: 2-3    Home dose: 5 mg nightly per patient    Outpatient INR Management: Unknown cardiologist in [place], [place] (will need to follow up)    Inpatient Management: Medicine 1    Concurrent Antiplatelet Agents: aspirin    Inpatient Warfarin Dosing:     Date INR Warfarin Dose   (@ 21:00) Bridging Regimen Drug Interactions Comments   03/17 2.23 5 mg      03/18 2.96 Held      03/19 3.78 Held  leflunomide may increase INR    03/20 4.13 Held      03/21 4.03 Held      03/22 4.16 Held      03/23 3.85 (Hold)                        Assessment/Plan:  Andrew Arias 's INR today is supra-therapeutic    Pharmacist recommendation: Continue to hold warfarin given very slow downtrend after only one dose. When warfarin deemed safe to restart, consider dosing very cautiously at a much lower dose. Per patient cannot afford DOACs.  Continue to check INR daily.    Monitoring:  Please continue to monitor PT/INR daily.  Monitor for signs/symptoms of bleeding.   Pharmacist will continue to make daily warfarin recommendations.   Please contact pharmacy with any questions.

## 2017-11-17 NOTE — Care Plan (Signed)
Problem: Adult Inpatient Plan of Care  Goal: Plan of Care Reviewed: Fall precautions, maintained. Call bell within reach. Bed side rails were up all the time and bed was placed in its lowest position. On bed alarm for safety. Maintained on oxygen at 2 L per NC due to desaturation to mis 80's. Able to take pill, whole with applesauce, repositioning done to promote comfort and prevent further pressure sores. Hourly rounds done to meet patient's needs. Will continue to monitor.  Outcome: Ongoing (see interventions/notes)  Goal: Patient-Specific Goal (Individualization)  Outcome: Ongoing (see interventions/notes)  Goal: Absence of Hospital-Acquired Illness or Injury  Outcome: Ongoing (see interventions/notes)  Goal: Optimal Comfort and Wellbeing  Outcome: Ongoing (see interventions/notes)  Goal: Rounds/Family Conference  Outcome: Ongoing (see interventions/notes)     Problem: Fall Injury Risk  Goal: Absence of Fall and Fall-Related Injury  Outcome: Ongoing (see interventions/notes)     Problem: Skin Injury Risk Increased  Goal: Skin Health and Integrity  Outcome: Ongoing (see interventions/notes)     Problem: Acute Rehab Services Goal & Intervention Plan  Goal: Bathing Goal  Description  Stand Alone Therapy Goal  Outcome: Ongoing (see interventions/notes)  Goal: Bed Mobility Goal  Description  Stand Alone Therapy Goal  Outcome: Ongoing (see interventions/notes)  Goal: Caregiver Training Goal  Description  Stand Alone Therapy Goal  Outcome: Ongoing (see interventions/notes)  Goal: Cognition Goal  Description  Stand Alone Therapy Goal  Outcome: Ongoing (see interventions/notes)  Goal: Cognition Goals, SLP  Description  Stand Alone Therapy Goal  Outcome: Ongoing (see interventions/notes)  Goal: Communication Goals, SLP  Description  Stand Alone Therapy Goal  Outcome: Ongoing (see interventions/notes)  Goal: Dysphagia Goals, SLP  Description  Stand Alone Therapy Goal  Outcome: Ongoing (see interventions/notes)  Goal:  Eating Self-Feeding Goal  Description  Stand Alone Therapy Goal  Outcome: Ongoing (see interventions/notes)  Goal: Gait Training Goal  Description  Stand Alone Therapy Goal  Outcome: Ongoing (see interventions/notes)  Goal: Grooming Goal  Description  Stand Alone Therapy Goal  Outcome: Ongoing (see interventions/notes)  Goal: Home Management Goal  Description  Stand Alone Therapy Goal  Outcome: Ongoing (see interventions/notes)  Goal: Interprofessional Goal  Description  Stand Alone Therapy Goal  Outcome: Ongoing (see interventions/notes)  Goal: LB Dressing Goal  Description  Stand Alone Therapy Goal  Outcome: Ongoing (see interventions/notes)  Goal: Occupational Therapy Goals  Description  Stand Alone Therapy Goal  Outcome: Ongoing (see interventions/notes)  Goal: Physical Therapy Goal  Description  Stand Alone Therapy Goal  Outcome: Ongoing (see interventions/notes)  Goal: Range of Motion Goal  Description  Stand Alone Therapy Goal  Outcome: Ongoing (see interventions/notes)  Goal: Strength Goal  Description  Stand Alone Therapy Goal  Outcome: Ongoing (see interventions/notes)  Goal: Toileting Goal  Description  Stand Alone Therapy Goal  Outcome: Ongoing (see interventions/notes)  Goal: Goal Transfer Training  Description  Stand Alone Therapy Goal  Outcome: Ongoing (see interventions/notes)  Goal: UB Dressing Goal  Description  Stand Alone Therapy Goal  Outcome: Ongoing (see interventions/notes)

## 2017-11-17 NOTE — Progress Notes (Signed)
INTERNAL MEDICINE  Progress Note     Name: Andrew Arias, Andrew Arias, 62 y.o. male Date of Service: 11/17/2017   YYT:[id number] LOS: 6   PCP:No Pcp Attending: Lurene Shadow, MD   Code Status: Full Code Admitted for: Septic shock (CMS [place])     SUBJECTIVE:      Sitting in chair this morning. No acute complaints but does not feel like he is back to baseline.      OBJECTIVE:    EXAMINATION:  Temperature: 36.5 C (97.7 F) BP (Non-Invasive): 114/63 Heart Rate: 81   Respiratory Rate: 16 SpO2: 95 % Weight: (!) 152 kg (335 lb)          Intake/Output Summary (Last 24 hours) at 11/17/2017 0753  Last data filed at 11/17/2017 0500  Gross per 24 hour   Intake 420 ml   Output 3700 ml   Net -3280 ml     Last Bowel Movement: 11/16/17     General: Well-nourished, well-developed, in NAD.  HEENT: NC/AT, EOMI, Oropharynx clear, mucous membranes moist, Trachea midline.  Cardiovascular:  Systolic murmur; no rubs, gallops.  Respiratory: CTABL  Abdominal: Bowel sounds normal; abdomen soft, non-tender to palpation  Extremities/Skin:  Significant lower extremity right greater than left edema. Warm and dry.  Neurological: Awake, A&O x 3.     LABS:    CBC Differential   Recent Labs     11/15/17  0521 11/16/17  0442 11/17/17  0457   WBC 5.8 5.6 7.8   HGB 10.2* 10.4* 11.4*   HCT 30.4* 30.9* 34.6*   PLTCNT 79* 112* 187    Recent Labs     11/15/17  0521 11/16/17  0442 11/17/17  0457   PMNS 89 89 79   LYMPHOCYTES 4 4 7    MONOCYTES 8 4 12    EOSINOPHIL 0 0 2   BASOPHILS 0  0.02 0  0.00 1  0.06   PMNABS 5.13 4.98 6.17   LYMPHSABS 0.20* 0.23* 0.50*   MONOSABS 0.44 0.22* 0.91   EOSABS 0.01 0.00 0.15      BMP LFTs   Recent Labs     11/16/17  1827 11/17/17  0457   SODIUM 140 142   POTASSIUM 3.2* 3.5   CHLORIDE 103 103   CO2 26 30   BUN 57* 53*   CREATININE 1.20 1.16   GLUCOSENF 193* 112   ANIONGAP 11 9   BUNCRRATIO 48* 46*   GFR >59 >59   CALCIUM 8.2* 8.3*   MAGNESIUM 1.8 1.8   PHOSPHORUS 1.9* 2.4    No results found for this encounter   CoAgs Blood Gas:   Recent  Labs     11/17/17  0457   PROTHROMTME 43.7*   INR 3.85*    No results found for this encounter    Cardiac Markers Lipid Panel   No results for input(s): TROPONINI, CKMB, MBINDEX, BNP in the last 72 hours. No results found for this encounter     IMAGING STUDIES:         PATHOLOGY & MICROBIOLOGY:    No results found for any visits on 11/11/17 (from the past 24 hour(s)).    INPATIENT MEDICATIONS:    Current Facility-Administered Medications:  acetaminophen (TYLENOL) tablet 650 mg Oral Q4H PRN   aspirin chewable tablet 81 mg 81 mg Oral Daily   atorvastatin (LIPITOR) tablet 80 mg Oral QPM   cefTRIAXone (ROCEPHIN) 1 g in iso-osmotic 50 mL premix IVPB 1 g Intravenous Q24H   collagenase (  SANTYL) 250 unit/gm ointment  Apply Topically Daily   digoxin (LANOXIN) tablet 125 mcg Oral Daily   docusate sodium (COLACE) capsule 100 mg Oral 2x/day   furosemide (LASIX) tablet 80 mg Oral 3x/day   insulin glargine (LANTUS) 100 units/mL injection 25 Units Subcutaneous Daily   NS flush syringe 2 mL Intracatheter Q8HRS   And      NS flush syringe 2-6 mL Intracatheter Q1 MIN PRN   nystatin (MYCOSTATIN) 100,000 units per mL oral liquid 5 mL Swish & Spit 4x/day   nystatin (NYSTOP) 100,000 units/g topical powder  Apply Topically 2x/day   ondansetron (ZOFRAN) 2 mg/mL injection 4 mg Intravenous Q8H PRN   phenol (CHLORASEPTIC) 1.4% oromucosal spray 1 Spray Mouth/Throat Q4H PRN   potassium bicarbonate-citric acid (EFFER-K) effervescent tablet 40 mEq Oral Now   predniSONE (DELTASONE) tablet 2.5 mg Oral Daily with Breakfast   sacubitril-valsartan (ENTRESTO) 49-51 mg per tablet 1 Tab Oral 2x/day   sennosides-docusate sodium (SENOKOT-S) 8.6-50mg  per tablet 1 Tab Oral 2x/day   SSIP insulin lispro (HUMALOG) 100 units/mL injection 4-12 Units Subcutaneous 4x/day PRN        ASSESSMENT:     CHF, uncontrolled DM 2, atrial fibrillation status post ablation on warfarin for anticoagulation, paced with AICD, CKD 3, gout, and RA who presented originally from  [facility] for shock, now as a MICU transfer to the medicine service.    Active Hospital Problems    Diagnosis   . Primary Problem: Septic shock (CMS HCC)   . UTI (urinary tract infection)        PLAN:   Shock, most likely combined septic/cardiogenic secondary to UTI/pyelonephritis versus lower extremity cellulitis  - blood cultures, urine cultures no growth   -patient initially treated with linezolid, cefepime; de-escalated to Rocephin for total 7 day course on 11/13/2017, with stop date of 11/17/2017.   -Levophed discontinued on 11/14/2016  -Concern for bladder diverticula/uretheral stricture on initial presentation. Urology consulted; foley in place  -stress dose steroids weaned to home prednisone 2.5 mg  (patient takes for RA on outpatient basis)    Acute on chronic CHF exacerbation  A-fib on warfarin- supratherapeutic INR   - TTE; EF 25% with multiple regions of marked akinesis/hypokinesis  - cardiology HF consulted; appreciate recs  - Cardiology blue following pt; restarted entresto; switched bumex to lasix 8- mg TID   - Continue dig  - Holding beta blocker  - Holding warfarin due to supra therapeutic INR   - Aspirin and lipitor 80 mg  - lymphedema consult ordered; appreciate recs.    Bladder dome lesion:  - CT renal calculus protocol and cystogram reviewed  - urology consulted  - s/p uretheral stricture dilation  - no additional imaging requiring  - pt will need f/u cystoscopy outpatient; urology has referred (3 week follow-up; orders placed in epic)  - pt to be d/c with foley    AKI on CKD III (improving):  - likely 2/2 septic shock; per report, baseline Cr ~1.6; was 3.6 at OSH  - urine lytes suggest pre-renal etioogy  - strict I/Os  -creatinine 1.41 at time of transfer.  -will continue to monitor with daily BMPs.    Probable left psoas hematoma:  - noted on CT 3/18, asymptomatic  - no hx recent trauma; likely 2/2 supratherapeutic INR  -will monitor.    Lymphedema  lower extremity wounds:  -  wound careand lymphedema OTconsulted; appreciate recs    DM2, uncontrolled:  - A1c 8.2  - hold home  PO agents  - SSI  - Will initiate NPH 10 units BID and regular insulin to make it more affordable for the patient  - Plan to discharge patient on jardiance (care management contacted to help with coupons since cost is an issue for the patient)      Chronic medical problems:  - CAD s/p CABG  HTN  HLD: hold coreg  - gout: hold allopurinol, colchicine  - BPH: hold tamsulosin  - Hypomagnesemia: hold PO supplement, replace prn  - WF:[id number], holdleflunamide    ____________________________________________________________________    Prophylaxis:  - DVT: held d/t INR  - GI: Not indicated  Analgesia:None  Nutrition:Diabetic and Cardiac  Therapy: OT and PT  Bowel regimen:senna, colace          Carlynn Spry, MD        Late entry for 11/17/17. I saw and examined the patient.  I reviewed the resident's note.  I agree with the findings and plan of care as documented in the resident's note.  Any exceptions/additions are edited/noted.    Lurene Shadow, MD

## 2017-11-18 DIAGNOSIS — E876 Hypokalemia: Secondary | ICD-10-CM

## 2017-11-18 DIAGNOSIS — M545 Low back pain: Secondary | ICD-10-CM

## 2017-11-18 LAB — CBC WITH DIFF
BASOPHIL #: 0.04 x10?3/uL (ref 0.00–0.20)
BASOPHIL #: 0.04 x10ˆ3/uL (ref 0.00–0.20)
BASOPHIL %: 0 %
EOSINOPHIL #: 0.11 x10ˆ3/uL (ref 0.00–0.50)
EOSINOPHIL %: 1 %
HCT: 33.6 % — ABNORMAL LOW (ref 36.7–47.0)
HGB: 11.2 g/dL — ABNORMAL LOW (ref 12.5–16.3)
LYMPHOCYTE #: 0.46 x10ˆ3/uL — ABNORMAL LOW (ref 1.00–4.80)
LYMPHOCYTE %: 5 %
MCH: 31.5 pg (ref 27.4–33.0)
MCHC: 33.3 g/dL (ref 32.5–35.8)
MCV: 94.7 fL (ref 78.0–100.0)
MONOCYTE #: 1 x10ˆ3/uL (ref 0.30–1.00)
MONOCYTE %: 10 %
MPV: 7.3 fL — ABNORMAL LOW (ref 7.5–11.5)
NEUTROPHIL #: 8.03 x10ˆ3/uL — ABNORMAL HIGH (ref 1.50–7.70)
NEUTROPHIL %: 83 %
PLATELETS: 207 x10ˆ3/uL (ref 140–450)
RDW: 18 % — ABNORMAL HIGH (ref 12.0–15.0)
WBC: 9.6 x10ˆ3/uL (ref 3.5–11.0)

## 2017-11-18 LAB — BASIC METABOLIC PANEL
ANION GAP: 9 mmol/L (ref 4–13)
ANION GAP: 9 mmol/L (ref 4–13)
BUN/CREA RATIO: 41 — ABNORMAL HIGH (ref 6–22)
BUN/CREA RATIO: 36 — ABNORMAL HIGH (ref 6–22)
BUN: 39 mg/dL — ABNORMAL HIGH (ref 8–25)
BUN: 36 mg/dL — ABNORMAL HIGH (ref 8–25)
CALCIUM: 8.2 mg/dL — ABNORMAL LOW (ref 8.5–10.2)
CALCIUM: 8.3 mg/dL — ABNORMAL LOW (ref 8.5–10.2)
CHLORIDE: 101 mmol/L (ref 96–111)
CO2 TOTAL: 29 mmol/L (ref 22–32)
CO2 TOTAL: 30 mmol/L (ref 22–32)
CREATININE: 0.96 mg/dL (ref 0.62–1.27)
CREATININE: 1 mg/dL (ref 0.62–1.27)
ESTIMATED GFR: >59 mL/min/1.73mˆ2 (ref >59)
ESTIMATED GFR: >59 mL/min/1.73mˆ2 (ref >59)
GLUCOSE: 124 mg/dL (ref 65–139)
GLUCOSE: 125 mg/dL (ref 65–139)
POTASSIUM: 3.4 mmol/L — ABNORMAL LOW (ref 3.5–5.1)
POTASSIUM: 4.1 mmol/L (ref 3.5–5.1)
SODIUM: 140 mmol/L (ref 136–145)
SODIUM: 140 mmol/L (ref 136–145)

## 2017-11-18 LAB — MAGNESIUM: MAGNESIUM: 1.5 mg/dL — ABNORMAL LOW (ref 1.6–2.5)

## 2017-11-18 LAB — POC BLOOD GLUCOSE (RESULTS)
GLUCOSE, POC: 182 mg/dl — ABNORMAL HIGH (ref 70–105)
GLUCOSE, POC: 138 mg/dl — ABNORMAL HIGH (ref 70–105)

## 2017-11-18 LAB — PT/INR: INR: 3.75 — ABNORMAL HIGH (ref 0.80–1.20)

## 2017-11-18 MED ORDER — LIDOCAINE 3.6 %-MENTHOL 1.25 % TOPICAL PATCH
1.0000 | MEDICATED_PATCH | Freq: Every day | CUTANEOUS | Status: DC
Start: 2017-11-18 — End: 2017-11-22
  Administered 2017-11-18 – 2017-11-20 (×5): 1 via TRANSDERMAL
  Administered 2017-11-21: 0 via TRANSDERMAL
  Administered 2017-11-22: 1 via TRANSDERMAL
  Filled 2017-11-18 (×5): qty 1

## 2017-11-18 MED ORDER — MAGNESIUM OXIDE 400 MG (241.3 MG MAGNESIUM) TABLET
400.0000 mg | ORAL_TABLET | Freq: Two times a day (BID) | ORAL | Status: DC
Start: 2017-11-18 — End: 2017-11-22
  Administered 2017-11-18 – 2017-11-22 (×9): 400 mg via ORAL
  Filled 2017-11-18 (×9): qty 1

## 2017-11-18 MED ORDER — INSULIN REGULAR HUMAN 100 UNIT/ML INJECTION SSIP
2.00 [IU] | INJECTION | Freq: Four times a day (QID) | SUBCUTANEOUS | Status: DC | PRN
Start: 2017-11-18 — End: 2017-11-22
  Administered 2017-11-18 – 2017-11-22 (×3): 2 [IU] via SUBCUTANEOUS

## 2017-11-18 MED ORDER — POTASSIUM CHLORIDE ER 20 MEQ TABLET,EXTENDED RELEASE(PART/CRYST)
40.0000 meq | ORAL_TABLET | ORAL | Status: AC
Start: 2017-11-18 — End: 2017-11-18
  Administered 2017-11-18: 40 meq via ORAL
  Filled 2017-11-18: qty 2

## 2017-11-18 MED ORDER — FUROSEMIDE 10 MG/ML INJECTION SOLUTION
80.0000 mg | Freq: Three times a day (TID) | INTRAMUSCULAR | Status: DC
Start: 2017-11-18 — End: 2017-11-20
  Administered 2017-11-18 – 2017-11-20 (×6): 80 mg via INTRAVENOUS
  Filled 2017-11-18 (×6): qty 8

## 2017-11-18 MED ORDER — POTASSIUM BICARBONATE-CITRIC ACID 20 MEQ EFFERVESCENT TABLET
40.0000 meq | EFFERVESCENT_TABLET | Freq: Every morning | ORAL | Status: AC
Start: 2017-11-18 — End: 2017-11-18
  Administered 2017-11-18: 40 meq via ORAL
  Filled 2017-11-18: qty 2

## 2017-11-18 MED ORDER — INSULIN U-100 REGULAR HUMAN 100 UNIT/ML INJECTION SOLUTION
2.0000 [IU] | Freq: Three times a day (TID) | INTRAMUSCULAR | Status: DC
Start: 2017-11-18 — End: 2017-11-22
  Administered 2017-11-18 – 2017-11-19 (×5): 2 [IU] via SUBCUTANEOUS
  Administered 2017-11-20 (×2): 4 [IU] via SUBCUTANEOUS
  Administered 2017-11-20: 2 [IU] via SUBCUTANEOUS
  Administered 2017-11-21: 4 [IU] via SUBCUTANEOUS
  Administered 2017-11-21 – 2017-11-22 (×3): 2 [IU] via SUBCUTANEOUS
  Administered 2017-11-22: 12:00:00 4 [IU] via SUBCUTANEOUS
  Filled 2017-11-18: qty 3

## 2017-11-18 MED ORDER — SODIUM DI- AND MONOPHOSPHATE-POTASSIUM PHOS MONOBASIC 250 MG TABLET
250.0000 mg | ORAL_TABLET | Freq: Four times a day (QID) | ORAL | Status: AC
Start: 2017-11-18 — End: 2017-11-18
  Administered 2017-11-18 (×4): 250 mg via ORAL
  Filled 2017-11-18 (×4): qty 1

## 2017-11-18 MED ORDER — MAGNESIUM SULFATE 4 GRAM/100 ML (4 %) IN WATER INTRAVENOUS PIGGYBACK
4.00 g | INJECTION | INTRAVENOUS | Status: AC
Start: 2017-11-18 — End: 2017-11-18
  Administered 2017-11-18: 0 g via INTRAVENOUS
  Administered 2017-11-18: 4 g via INTRAVENOUS
  Filled 2017-11-18: qty 100

## 2017-11-18 MED ADMIN — atorvastatin 80 mg tablet: ORAL | @ 21:00:00

## 2017-11-18 MED ADMIN — hydrOXYzine HCL 25 mg tablet: SUBCUTANEOUS | @ 12:00:00

## 2017-11-18 MED ADMIN — famotidine (PF) 20 mg/2 mL intravenous solution: @ 06:00:00

## 2017-11-18 MED ADMIN — lactated Ringers intravenous solution: INTRAVENOUS | @ 12:00:00 | NDC 00338011704

## 2017-11-18 MED ADMIN — lactated Ringers intravenous solution: SUBCUTANEOUS | @ 16:00:00 | NDC 00338011704

## 2017-11-18 NOTE — Pharmacy (Signed)
[place] / Department of Pharmaceutical Services  Therapeutic Drug Monitoring: Warfarin Progress Note    Andrew Arias is a 62 y.o. year old male on warfarin for afib     Goal INR: 2-3    Home dose: 5 mg nightly per patient    Outpatient INR Management: Unknown cardiologist in [place], [place] (will need to follow up)    Inpatient Management: Medicine 1    Concurrent Antiplatelet Agents: aspirin    Inpatient Warfarin Dosing:     Date INR Warfarin Dose   (@ 21:00) Bridging Regimen Drug Interactions Comments   03/17 2.23 5 mg      03/18 2.96 Held      03/19 3.78 Held  leflunomide may increase INR    03/20 4.13 Held      03/21 4.03 Held      03/22 4.16 Held      03/23 3.85 Held      03/24 3.75 (Hold)                Assessment/Plan:  Andrew Arias 's INR today is supra-therapeutic    Pharmacist recommendation: Continue to hold warfarin given very slow downtrend after only one dose. When warfarin deemed safe to restart, consider dosing very cautiously at a much lower dose. Per patient cannot afford DOACs.      Monitoring:  Please continue to monitor PT/INR daily.  Monitor for signs/symptoms of bleeding.   Pharmacist will continue to make daily warfarin recommendations.   Please contact pharmacy with any questions.

## 2017-11-18 NOTE — Care Plan (Signed)
[place]  Occupational Therapy Progress Note    Patient Name: Andrew Arias  Date of Birth: July 28, 1956  Height:  193 cm (6' 3.98")  Weight:  (!) 152 kg (335 lb)  Room/Bed: 805/A  Payor: [place] / Plan: [place] [facility] / Product Type: MEDICARE [facility] /     Assessment:    Patient tolerated OT treatment session fairly well. Patient demonstrated improvements with functional transfers, functional mobility, and activity tolerance. Patient continues to be limited by increased pain and decreased balance, strength, endurance, flexibility, and safety. From OT perspective, once medically stable, patient would benefit from going to an Acute Rehab facility to increase independence with ADLs and functional transfers/mobility. OT will continue to follow patient while in acute setting.      Discharge Needs:   Equipment Recommendation: to be determined  Discharge Disposition:  inpatient rehabilitation facility   The above recommendation is based upon the current examination and evaluation performed on this date. As subsequent sessions are completed, recommendations will be updated accordingly.    JUSTIFICATION OF DISCHARGE RECOMMENDATION   Based on current diagnosis, functional performance prior to admission, and current functional performance, this patient requires continued OT services in inpatient rehabilitation facility in order to achieve significant functional improvements.    Plan:   Continue to follow patient according to established plan of care.  The risks/benefits of therapy have been discussed with the patient/caregiver and he/she is in agreement with the established plan of care.     Subjective & Objective:        11/18/17 7824   Therapist Pager   OT Assigned/ Pager # [phone]   Rehab Session   Document Type therapy progress note (daily note)   Total OT Minutes: 13   Patient Effort good   Symptoms Noted During/After Treatment increased pain   Symptoms Noted Comment RN  notified   General Information   Patient Profile Reviewed? yes   General Observations of Patient Patient agreeable to OT treatment session. Patient seen with professional assist of PT.   Medical Lines Foley Catheter;PIV Line;Telemetry   Respiratory Status room air   Existing Precautions/Restrictions fall precautions;full code;other (see comments)  (at risk for skin breakdown)   Pre Treatment Status   Pre Treatment Patient Status Patient sitting in bedside chair or w/c;Call light within reach;Telephone within reach;Sitter select activated;Nurse approved session   Support Present Pre Treatment  Nurse present   Communication Pre Treatment  Nurse   Mutuality/Individual Preferences   Individualized Care Needs OOB with FWW with assist x2   Vital Signs   O2 Delivery Pre Treatment room air   O2 Delivery Post Treatment room air   Pain Assessment   Pain Scale: Numbers, Post-Treatment 10/10   Pre/Post Treatment Pain Comment Patient c/o increased back pain as treatment session progressed.   Coping/Psychosocial   Observed Emotional State accepting;calm;cooperative   Verbalized Emotional State acceptance   Plan of Care Reviewed With patient   Coping/Psychosocial Response Interventions   Plan Of Care Reviewed With patient   Cognitive Assessment/Interventions   Behavior/Mood Observations alert;cooperative   Attention WNL/WFL   Follows Commands [facility]   Mobility Assessment/Training   Mobility Comment Patient completed functional mobility of ~30 feet with use of FWW with CGA x2 for steadying assistance and safety.   Transfer Assessment/Treatment   Sit-Stand Independence contact guard assist;verbal cues required   Stand-Sit Independence contact guard assist;verbal cues required   Sit-Stand-Sit, Assist Device walker, front wheeled  Transfer Impairments balance impaired;endurance;flexibility decreased;pain;strength decreased   Transfer Comment Patient transferred from sitting in recliner chair to standing with FWW and standing with FWW to  sitting in recliner chair with CGA x1 for steadying assistance and safety.   Upper Body Dressing Assessment/Training   Position  sitting   Independence Level  minimum assist (75% patient effort)   Impairments  activity tolerance impaired;balance impaired;flexibility decreased;pain;strength decreased   Comment Patient donned gown around back with Min A to guide UEs.   Lower Body Dressing Assessment/Training   Position sitting   DRESSING ASSESSED Don Socks   ASSISTANCE REQUIRED DONNING Right sock;Left sock   Independence Level  dependent (less than 25% patient effort)   Impairments activity tolerance impaired;balance impaired;flexibility decreased;pain;ROM decreased;strength decreased   Balance Skill Training   Comment with use of FWW   Sitting Balance: Static fair + balance   Sitting, Dynamic (Balance) fair balance   Sit-to-Stand Balance fair balance   Standing Balance: Static fair balance   Standing Balance: Dynamic fair balance   Systems Impairment Contributing to Balance Disturbance musculoskeletal   Identified Impairments Contributing to Balance Disturbance pain;decreased strength   Post Treatment Status   Post Treatment Patient Status Patient sitting in bedside chair or w/c;Call light within reach;Telephone within reach;Sitter select activated   Support Present Post Treatment  None   Occupational Therapy Clinical Impression   Functional Level at Time of Session Patient tolerated OT treatment session fairly well. Patient demonstrated improvements with functional transfers, functional mobility, and activity tolerance. Patient continues to be limited by increased pain and decreased balance, strength, endurance, flexibility, and safety. From OT perspective, once medically stable, patient would benefit from going to an Acute Rehab facility to increase independence with ADLs and functional transfers/mobility. OT will continue to follow patient while in acute setting.   Anticipated Equipment Needs at Discharge to be  determined   Anticipated Discharge Disposition inpatient rehabilitation facility       Therapist:   Darrick Penna, OTR/L  Pager #: [phone]  11/18/2017 13:37

## 2017-11-18 NOTE — Nurses Notes (Signed)
Patient wanting to take bipap off at this time, patient placed back on NC at this time. Will continue to monitor.

## 2017-11-18 NOTE — Care Plan (Signed)
[place]  Physical Therapy Progress Note    Patient Name: Andrew Arias  Date of Birth: [birth year]  Height:  193 cm (6' 3.98")  Weight:  (!) 152 kg (335 lb)  Room/Bed: 805/A  Payor: [facility] / Plan: [place] [facility] / Product Type: MEDICARE [facility] /     Assessment:     Pt with fair tol to tx. Pt limited today by back pain. Pt motivated to return to PLOF and agreeable to need for inpt rehab prior to D/C home. Rec inpt rehab to work on strengthening and mobility tolerance.    Discharge Needs:   Equipment Recommendation: TBD    Discharge Disposition: inpatient rehabilitation facility    JUSTIFICATION OF DISCHARGE RECOMMENDATION   Based on current diagnosis, functional performance prior to admission, and current functional performance, this patient requires continued PT services in inpatient rehabilitation facility in order to achieve significant functional improvements in these deficit areas: muscle performance, gait, locomotion, and balance.  The above recommendation is based upon the current examination and evaluation performed on this date. As subsequent sessions are completed, recommendations will be updated accordingly.    Plan:   Continue to follow patient according to established plan of care.  The risks/benefits of therapy have been discussed with the patient/caregiver and he/she is in agreement with the established plan of care.     Subjective & Objective:        11/18/17 2878   Rehab Session   Document Type therapy progress note (daily note)   Total PT Minutes: 15   Patient Effort good   Symptoms Noted During/After Treatment increased pain   General Information   Patient/Family/Caregiver Comments/Observations Pt seen bedside with OT for tx. Pt hopeful for D/C to rehab soon.   Medical Lines Telemetry;PIV Line;Foley Catheter   Respiratory Status room air   Existing Precautions/Restrictions full code;fall precautions   Mutuality/Individual Preferences   Individualized  Care Needs OOB with FWW and (A)   Pre Treatment Status   Pre Treatment Patient Status Patient sitting in bedside chair or w/c;Call light within reach;Sitter select activated;Nurse approved session   Support Present Pre Treatment  None   Communication Pre Treatment  Nurse   Pain Assessment   Pain Scale: Numbers, Post-Treatment 10/10   Pre/Post Treatment Pain Comment c/o increased back pain as tx progressed   Transfer Assessment/Treatment   Sit-Stand Independence contact guard assist;verbal cues required   Stand-Sit Independence contact guard assist;verbal cues required   Sit-Stand-Sit, Assist Device walker, front wheeled   Transfer Impairments endurance;pain;strength decreased   Gait Assessment/Treatment   Independence  contact guard assist;2 person assist required;verbal cues required   Assistive Device  walker, front wheeled   Distance in Feet 30   Impairments  endurance;pain;strength decreased   Comment Pt with with FWD posture and increased back pain as tx progressed requiring 2 standing rest breaks.   Balance Skill Training   Comment with FWW   Sitting Balance: Static fair + balance   Sitting, Dynamic (Balance) fair balance   Sit-to-Stand Balance fair balance   Standing Balance: Static fair balance   Standing Balance: Dynamic fair balance   Post Treatment Status   Post Treatment Patient Status Patient sitting in bedside chair or w/c;Call light within reach;Sitter select activated   Support Present Post Treatment  None   Physical Therapy Clinical Impression   Assessment Pt with fair tol to tx. Pt limited today by back pain. Pt motivated to return to  [facility] and agreeable to need for inpt rehab prior to D/C home. Rec inpt rehab to work on strengthening and mobility tolerance.   Anticipated Equipment Needs at Discharge (PT) TBD   Anticipated Discharge Disposition inpatient rehabilitation facility       Therapist:   Ernst Bowler, PTA 11/18/2017 12:45  Pager #: [phone]

## 2017-11-18 NOTE — Progress Notes (Signed)
INTERNAL MEDICINE  Progress Note     Name: Andrew Arias, Mish, 62 y.o. male Date of Service: 11/18/2017   IRW:[id number] LOS: 7   PCP:No Pcp Attending: Lurene Shadow, MD   Code Status: Full Code Admitted for: Septic shock (CMS [place])     SUBJECTIVE:      Sitting in chair this morning.  No complaints this morning.  He denies any fevers, chills, chest pain, pressure, nausea, vomiting or diarrhea.  He states that he had a brief period of nausea yesterday evening, which has since resolved.  He says that it is uncomfortable to keep his foot elevated, but is continuing to elevate it intermittently as tolerated.       OBJECTIVE:    EXAMINATION:  Temperature: 36.5 C (97.7 F) BP (Non-Invasive): 100/63 Heart Rate: 77   Respiratory Rate: 18 SpO2: 92 % Weight: (!) 152 kg (335 lb)          Intake/Output Summary (Last 24 hours) at 11/18/2017 6761  Last data filed at 11/18/2017 0247  Gross per 24 hour   Intake 680 ml   Output 3000 ml   Net -2320 ml     Last Bowel Movement: 11/15/17     General: Well-nourished, well-developed, in NAD.  HEENT: NC/AT, EOMI, Oropharynx clear, mucous membranes moist, Trachea midline.  Cardiovascular:  Systolic murmur; no rubs, gallops.  Respiratory: CTABL  Abdominal: Bowel sounds normal; abdomen soft, non-tender to palpation  Extremities/Skin:  Significant lower extremity right greater than left edema. Warm and dry.  Neurological: Awake, A&O x 3.     LABS:    CBC Differential   Recent Labs     11/16/17  0442 11/17/17  0457 11/18/17  0432   WBC 5.6 7.8 9.6   HGB 10.4* 11.4* 11.2*   HCT 30.9* 34.6* 33.6*   PLTCNT 112* 187 207    Recent Labs     11/16/17  0442 11/17/17  0457 11/18/17  0432   PMNS 89 79 83   LYMPHOCYTES 4 7 5    MONOCYTES 4 12 10    EOSINOPHIL 0 2 1   BASOPHILS 0  0.00 1  0.06 0  0.04   PMNABS 4.98 6.17 8.03*   LYMPHSABS 0.23* 0.50* 0.46*   MONOSABS 0.22* 0.91 1.00   EOSABS 0.00 0.15 0.11      BMP LFTs   Recent Labs     11/17/17  1334 11/18/17  0432   SODIUM 139 140   POTASSIUM 3.5 3.4*      CHLORIDE 100 102   CO2 28 29   BUN 49* 39*   CREATININE 1.18 0.96   GLUCOSENF 162* 124   ANIONGAP 11 9   BUNCRRATIO 42* 41*   GFR >59 >59   CALCIUM 8.2* 8.2*   MAGNESIUM  --  1.5*   PHOSPHORUS  --  2.2*    No results found for this encounter   CoAgs Blood Gas:   Recent Labs     11/18/17  0432   PROTHROMTME 42.6*   INR 3.75*    No results found for this encounter    Cardiac Markers Lipid Panel   No results for input(s): TROPONINI, CKMB, MBINDEX, BNP in the last 72 hours. No results found for this encounter     IMAGING STUDIES:         PATHOLOGY & MICROBIOLOGY:    No results found for any visits on 11/11/17 (from the past 24 hour(s)).    INPATIENT MEDICATIONS:    Current  Facility-Administered Medications:  acetaminophen (TYLENOL) tablet 650 mg Oral Q4H PRN   aspirin chewable tablet 81 mg 81 mg Oral Daily   atorvastatin (LIPITOR) tablet 80 mg Oral QPM   collagenase (SANTYL) 250 unit/gm ointment  Apply Topically Daily   digoxin (LANOXIN) tablet 125 mcg Oral Daily   docusate sodium (COLACE) capsule 100 mg Oral 2x/day   furosemide (LASIX) tablet 80 mg Oral 3x/day   insulin NPH human 100 units/mL injection 10 Units Subcutaneous 2x/day AC   magnesium oxide (MAG-OX) tablet 400 mg Oral 2x/day   magnesium sulfate 4 G in SW 100 mL premix IVPB 4 g Intravenous Now   NS flush syringe 2 mL Intracatheter Q8HRS   And      NS flush syringe 2-6 mL Intracatheter Q1 MIN PRN   nystatin (MYCOSTATIN) 100,000 units per mL oral liquid 5 mL Swish & Spit 4x/day   nystatin (NYSTOP) 100,000 units/g topical powder  Apply Topically 2x/day   ondansetron (ZOFRAN) 2 mg/mL injection 4 mg Intravenous Q8H PRN   phenol (CHLORASEPTIC) 1.4% oromucosal spray 1 Spray Mouth/Throat Q4H PRN   potassium & sodium phosphate (K PHOS NEUTRAL) tablet 250 mg Oral 4x/day PC   potassium bicarbonate-citric acid (EFFER-K) effervescent tablet 40 mEq Oral Daily with Breakfast   potassium chloride (K-DUR) extended release tablet 40 mEq Oral Now   predniSONE (DELTASONE)  tablet 2.5 mg Oral Daily with Breakfast   sacubitril-valsartan (ENTRESTO) 49-51 mg per tablet 1 Tab Oral 2x/day   sennosides-docusate sodium (SENOKOT-S) 8.6-50mg  per tablet 1 Tab Oral 2x/day   SSIP insulin lispro (HUMALOG) 100 units/mL injection 4-12 Units Subcutaneous 4x/day PRN        ASSESSMENT:     CHF, uncontrolled DM 2, atrial fibrillation status post ablation on warfarin for anticoagulation, paced with AICD, CKD 3, gout, and RA who presented originally from [facility] for shock, now as a MICU transfer to the medicine service.    Active Hospital Problems    Diagnosis   . Primary Problem: Septic shock (CMS [facility])   . Hypokalemia   . UTI (urinary tract infection)        PLAN:   Shock, most likely combined septic/cardiogenic secondary to UTI/pyelonephritis versus lower extremity cellulitis  - blood cultures, urine cultures no growth   -patient initially treated with linezolid, cefepime; de-escalated to Rocephin for total 7 day course on 11/13/2017, with stop date of 11/17/2017.   -Levophed discontinued on 11/14/2016  -Concern for bladder diverticula/uretheral stricture on initial presentation. Urology consulted; foley in place  -stress dose steroids weaned to home prednisone 2.5 mg  (patient takes for RA on outpatient basis)    Acute on chronic CHF exacerbation  A-fib on warfarin- supratherapeutic INR   - TTE; EF 25% with multiple regions of marked akinesis/hypokinesis  - cardiology HF consulted; appreciate recs  - Cardiology blue following pt; restarted entresto; switched bumex to lasix 80 mg TID po for 1.5 days. Switching po to IV for increased diuresis  - will continue to monitor BMP q12 hr  - Continue dig  - Holding beta blocker  - Holding warfarin due to supra therapeutic INR   - Aspirin and lipitor 80 mg  - lymphedema consult ordered; appreciate recs.    Bladder dome lesion:  - CT renal calculus protocol and cystogram reviewed  - urology consulted  - s/p uretheral stricture dilation  - no additional  imaging requiring  - pt will need f/u cystoscopy outpatient; urology has referred (3 week follow-up; orders placed in epic)  -  pt to be d/c with foley    AKI on CKD III (improving):  - likely 2/2 septic shock; per report, baseline Cr ~1.6; was 3.6 at OSH  - urine lytes suggest pre-renal etioogy  - strict I/Os  -creatinine 1.41 at time of transfer.  -will continue to monitor with daily BMPs.    Probable left psoas hematoma:  - noted on CT 3/18, asymptomatic  - no hx recent trauma; likely 2/2 supratherapeutic INR  -will monitor.    Lymphedema  lower extremity wounds:  - wound careand lymphedema OTconsulted; appreciate recs    DM2, uncontrolled:  - A1c 8.2  - hold home PO agents  - SSI  - Will initiate NPH 10 units BID and regular insulin to make it more affordable for the patient  - Plan to discharge patient on jardiance (care management contacted to help with coupons since cost is an issue for the patient)    Back pain  - patient complaining of back pain this morning, offered lidocaine patches.        Chronic medical problems:  - CAD s/p CABG  HTN  HLD: hold coreg  - gout: hold allopurinol, colchicine  - BPH: hold tamsulosin  - Hypomagnesemia: hold PO supplement, replace prn  - KK:[id number], holdleflunamide    ____________________________________________________________________    Prophylaxis:  - DVT: held d/t INR  - GI: Not indicated  Analgesia:None  Nutrition:Diabetic and Cardiac  Therapy: OT and PT  Bowel regimen:senna, colace      Joshua C. Lurline Hare MD, PhD   PGY1, Transitional Year Resident   Pager [phone]        I saw and examined the patient.  I reviewed the resident's note.  I agree with the findings and plan of care as documented in the resident's note.  Any exceptions/additions are edited/noted.    Lurene Shadow, MD

## 2017-11-19 LAB — BASIC METABOLIC PANEL
ANION GAP: 7 mmol/L (ref 4–13)
ANION GAP: 9 mmol/L (ref 4–13)
BUN/CREA RATIO: 37 — ABNORMAL HIGH (ref 6–22)
BUN/CREA RATIO: 34 — ABNORMAL HIGH (ref 6–22)
BUN: 35 mg/dL — ABNORMAL HIGH (ref 8–25)
BUN: 34 mg/dL — ABNORMAL HIGH (ref 8–25)
CALCIUM: 8.5 mg/dL (ref 8.5–10.2)
CALCIUM: 8.5 mg/dL (ref 8.5–10.2)
CHLORIDE: 101 mmol/L (ref 96–111)
CHLORIDE: 100 mmol/L (ref 96–111)
CO2 TOTAL: 33 mmol/L — ABNORMAL HIGH (ref 22–32)
CO2 TOTAL: 30 mmol/L (ref 22–32)
CREATININE: 0.95 mg/dL (ref 0.62–1.27)
CREATININE: 0.99 mg/dL (ref 0.62–1.27)
ESTIMATED GFR: >59 mL/min/1.73mˆ2 (ref >59)
ESTIMATED GFR: >59 mL/min/{1.73_m2} (ref >59)
ESTIMATED GFR: >59 mL/min/{1.73_m2} (ref >59)
GLUCOSE: 105 mg/dL (ref 65–139)
GLUCOSE: 133 mg/dL (ref 65–139)
POTASSIUM: 3.5 mmol/L (ref 3.5–5.1)
POTASSIUM: 4.2 mmol/L (ref 3.5–5.1)
SODIUM: 141 mmol/L (ref 136–145)
SODIUM: 139 mmol/L (ref 136–145)

## 2017-11-19 LAB — POC BLOOD GLUCOSE (RESULTS)
GLUCOSE, POC: 117 mg/dl — ABNORMAL HIGH (ref 70–105)
GLUCOSE, POC: 170 mg/dl — ABNORMAL HIGH (ref 70–105)
GLUCOSE, POC: 108 mg/dl — ABNORMAL HIGH (ref 70–105)

## 2017-11-19 LAB — CBC WITH DIFF
BASOPHIL #: 0.06 x10ˆ3/uL (ref 0.00–0.20)
EOSINOPHIL #: 0.11 x10ˆ3/uL (ref 0.00–0.50)
EOSINOPHIL %: 1 %
HCT: 34 % — ABNORMAL LOW (ref 36.7–47.0)
LYMPHOCYTE #: 0.51 10*3/uL — ABNORMAL LOW (ref 1.00–4.80)
LYMPHOCYTE %: 5 %
MCH: 31.7 pg (ref 27.4–33.0)
MCHC: 33.1 g/dL (ref 32.5–35.8)
MCV: 95.7 fL (ref 78.0–100.0)
MONOCYTE #: 0.92 10*3/uL (ref 0.30–1.00)
MONOCYTE #: 0.92 x10ˆ3/uL (ref 0.30–1.00)
MONOCYTE %: 9 %
MPV: 7.3 fL — ABNORMAL LOW (ref 7.5–11.5)
MPV: 7.3 fL — ABNORMAL LOW (ref 7.5–11.5)
NEUTROPHIL #: 8.74 x10ˆ3/uL — ABNORMAL HIGH (ref 1.50–7.70)
NEUTROPHIL %: 85 %
PLATELETS: 224 x10ˆ3/uL (ref 140–450)
RDW: 18.4 % — ABNORMAL HIGH (ref 12.0–15.0)
WBC: 10.3 x10ˆ3/uL (ref 3.5–11.0)

## 2017-11-19 LAB — PHOSPHORUS: PHOSPHORUS: 3.1 mg/dL (ref 2.3–4.0)

## 2017-11-19 LAB — PT/INR: INR: 3.38 — ABNORMAL HIGH (ref 0.80–1.20)

## 2017-11-19 LAB — MAGNESIUM: MAGNESIUM: 1.9 mg/dL (ref 1.6–2.5)

## 2017-11-19 LAB — BLOOD CULTURE (DETERMINE LINE SEPSIS)
BLOOD CULTURE, LINE SEPSIS: NO GROWTH
BLOOD CULTURE, LINE SEPSIS: NO GROWTH

## 2017-11-19 MED ORDER — POTASSIUM BICARBONATE-CITRIC ACID 20 MEQ EFFERVESCENT TABLET
40.00 meq | EFFERVESCENT_TABLET | Freq: Two times a day (BID) | ORAL | Status: AC
Start: 2017-11-19 — End: 2017-11-19
  Administered 2017-11-19 (×2): 40 meq via ORAL
  Filled 2017-11-19: qty 2

## 2017-11-19 MED ORDER — POTASSIUM BICARBONATE-CITRIC ACID 20 MEQ EFFERVESCENT TABLET
40.0000 meq | EFFERVESCENT_TABLET | Freq: Once | ORAL | Status: AC
Start: 2017-11-19 — End: 2017-11-19
  Administered 2017-11-19: 40 meq via ORAL

## 2017-11-19 MED ORDER — POTASSIUM BICARBONATE-CITRIC ACID 20 MEQ EFFERVESCENT TABLET
40.0000 meq | EFFERVESCENT_TABLET | Freq: Two times a day (BID) | ORAL | Status: DC
Start: 2017-11-19 — End: 2017-11-19
  Administered 2017-11-19: 40 meq via ORAL
  Filled 2017-11-19: qty 2

## 2017-11-19 MED ORDER — POLYETHYLENE GLYCOL 3350 17 GRAM ORAL POWDER PACKET
17.0000 g | Freq: Every day | ORAL | Status: DC
Start: 2017-11-19 — End: 2017-11-22
  Administered 2017-11-19 – 2017-11-20 (×2): 17 g via ORAL
  Administered 2017-11-21 – 2017-11-22 (×2): 0 g via ORAL
  Filled 2017-11-19 (×3): qty 1

## 2017-11-19 MED ORDER — POTASSIUM CHLORIDE 10 MEQ/100ML IN STERILE WATER INTRAVENOUS PIGGYBACK
10.0000 meq | INJECTION | INTRAVENOUS | Status: DC
Start: 2017-11-19 — End: 2017-11-19
  Filled 2017-11-19 (×4): qty 100

## 2017-11-19 MED ADMIN — lactated Ringers intravenous solution: @ 10:00:00

## 2017-11-19 NOTE — Pharmacy (Signed)
[facility]  Therapeutic Drug Monitoring: Warfarin Progress Note    Andrew Arias is a 62 y.o. year old male on warfarin for afib     Goal INR: 2-3    Home dose: 5 mg nightly per patient    Outpatient INR Management: Unknown cardiologist in [place], [place] (will need to follow up)    Inpatient Management: Medicine 1    Concurrent Antiplatelet Agents: aspirin    Inpatient Warfarin Dosing:     Date INR Warfarin Dose   (@ 21:00) Bridging Regimen Drug Interactions Comments   03/17 2.23 5 mg      03/18 2.96 Held      03/19 3.78 Held  leflunomide may increase INR    03/20 4.13 Held      03/21 4.03 Held      03/22 4.16 Held      03/23 3.85 Held      03/24 3.75 Held      03/25 3.38 (Hold)                        Assessment/Plan:  Andrew Arias 's INR today is supra-therapeutic    Pharmacist recommendation: Continue to hold warfarin given very slow downtrend after only one dose. When warfarin deemed safe to restart, consider dosing very cautiously at a much lower dose. Per patient cannot afford DOACs.      Monitoring:  Please continue to monitor PT/INR daily.  Monitor for signs/symptoms of bleeding.   Pharmacist will continue to make daily warfarin recommendations.   Please contact pharmacy with any questions.

## 2017-11-19 NOTE — Care Management Notes (Addendum)
[facility]  Care Management Note    Patient Name: Andrew Arias  Date of Birth: April 03, 1956  Sex: male  Date/Time of Admission: 11/11/2017  7:20 AM  Room/Bed: 805/A  Payor: [place] / Plan: [place] [facility] / Product Type: [facility]ARE [facility] /    LOS: 8 days   Primary Care Providers:  Pcp, No (General)    Admitting Diagnosis:  UTI (urinary tract infection) [N39.0]    Assessment:      11/19/17 1356   Assessment Details   Assessment Type Continued Assessment   Date of Care Management Update 11/19/17   Date of Next DCP Update 11/21/17   Care Management Plan   Discharge Planning Status plan in progress   Projected Discharge Date 11/20/17   CM will evaluate for rehabilitation potential yes   Discharge Needs Assessment   Discharge Facility/Level of Care Needs SNF Placement (Medicare certified)(code 3)       Discharge Plan:  Home vs SNF  Pt's insurance denied Acute Rehab. Pt is agreeable to PT/OT recommendation for SNF placement. Pt provided verbal consent to send referrals via [employer] to [facility], [place]. MSW educated pt on his insurance benefits ($20/day copay for first 20 days, then $100/day starting day 21). Pt verbalized understanding. MSW phoned [facility], left message for [place]. MSW phoned [facility], left message for [place]. MSW phoned [facility], left message for admissions. PASSR completed.     MSW received return call from [place] with [facility] who reports no male beds available.     The patient will continue to be evaluated for developing discharge needs.     Case Manager: Theodis Aguas  Phone: [phone]

## 2017-11-19 NOTE — Care Plan (Addendum)
Pt calm and cooperative, A/Ox  4, per baseline. Pt sat up in the chair frequently, most of this shift. Pt refused any tylenol for pain. Wound dressing changed at 1030, tolerated well. Uses call bell to effectively make needs known. Hourly rounding completed per RN and CA, to make sure pt's needs are effectively met. VS and assessments documented per flowsheets. Plan of care discussed with pt. No verbalized questions at this time. Will continue to monitor   Problem: Adult Inpatient Plan of Care  Goal: Patient-Specific Goal (Individualization)  11/19/2017 1907 by Malcolm Metro, RN  Outcome: Ongoing (see interventions/notes)  Flowsheets (Taken 11/19/2017 1815)  Individualized Care Needs: Pt wants to be able to have a regular diet and "eat normal food"

## 2017-11-19 NOTE — Consults (Signed)
[place]  Follow up Note:    Andrew Arias, Andrew Arias, 62 y.o. male  Date of Service: 11/19/2017  Date of Birth: [birth year]  LOS: 8 days  Requesting Provider/Service: MICU 2    Reason for Admission: Heart Failure        Subjective:      No overnight event including CP or new SOB. Pt was doing okay and was sitting in the chair. He can walk around his room without any SOB.      CAD hx:  October 2015 he had undergone surgical revascularization having received a LIMA sequenced to the diagonal and LAD and saphenous vein graft to the obtuse marginal and free radial to the PDA of the right coronary artery. Unfortunately, there is no meaningful improvement with regard to his left ventricular function. His last echocardiogram of January 2017 reported an ejection fraction of 15%. This was similar to a study performed in October 2016.        PMH:  Diagnosis   . Essential hypertension, benign   . Pure hypercholesterolemia   . Coronary atherosclerosis of native coronary artery   . Splenomegaly   . Postsurgical percutaneous transluminal coronary angioplasty status   . Cardiomyopathy (Notable Code)   . Dyspnea   . Long term current use of anticoagulant therapy   . Fatigue   . Shortness of breath   . Edema   . Chronic a-fib (Notable Code)   . Ventricular tachycardia seen on cardiac monitor (Notable Code)   . Acute on chronic systolic heart failure (Notable Code)   . Morbid obesity with BMI of 40.0-44.9, adult (Notable Code)   . CABG x 4 --LIMA->D,LIMA->LAD, SVG->OM, RIMA->PDA on 06/15/14 by Dr.Navid   . AICD (automatic cardioverter/defibrillator) present BiV ICD  . Sleep apnea   . Bacteremia   . Osteomyelitis of ankle (Notable Code)   . Pulmonary HTN (Notable Code)   . Decubitus ulcer of right ankle, stage 4 (Notable Code)   . Primary cardiomyopathy (Notable Code)   . Type 2 diabetes mellitus without complication (Notable Code)   . Encounter for long-term (current) use of medications   . CHF  exacerbation (Notable Code)   . Screening for thyroid disorder   . Screening, anemia, deficiency, iron   . Cardiac LV ejection fraction 15% as shown on Echocardiogram of 09-08-2015          Home Meds:  Medication Sig   . allopurinol (ZYLOPRIM) 300 mg oral tablet Take 300 mg by mouth daily   . aspirin (ECOTRIN) 325 mg oral delayed-release tablet Take 325 mg by mouth daily   . bumetanide (BUMEX) 2 mg oral tablet Take by mouth daily 1-2 mg As need   . carvedilol (COREG) 12.5 mg oral tablet Take 12.5 mg by mouth 2 times a day with meals   . colchicine (COLCRYS) 0.6 mg oral tablet Take 0.6 mg by mouth 3 times per week   . cyanocobalamin (VITAMIN B-12) 1,000 mcg oral tablet Take 1,000 micrograms by mouth daily   . digoxin (LANOXIN) 125 mcg oral tablet Take 125 micrograms by mouth daily   . ferrous gluconate 324 mg (37.5 mg iron) oral tablet Take 1 tablet by mouth daily   . folic acid (FOLVITE) 595 mcg oral tablet Take 800 micrograms by mouth daily   . gabapentin (NEURONTIN) 300 mg oral capsule Take 300 mg by mouth Patient is currently taking 300 mg in AM and 600 mg in PM   .  insulin detemir (LEVEMIR FLEXPEN) 100 unit/mL (3 mL) subcutaneous flextouch Inject into the skin every morning and at bedtime 24 units in AM and 52 in PM    . isosorbide mononitrate (IMDUR) 30 mg oral extended-release tablet Take 30 mg by mouth daily   . magnesium oxide (MAG-OX) 400 mg oral tablet Take 400 mg by mouth daily   . potassium bicarbonate (EFFER-K) 25 mEq oral disintegrating tablet Take 25 mEq by mouth as needed   . predniSONE (DELTASONE) 2.5 mg oral tablet Take 2.5 mg by mouth daily   . warfarin (COUMADIN) 5 mg oral tablet TAKE ONE TO TWO TABLETS BY MOUTH DAILY AS DIRECTED BY [facility] DAPS   . docusate sodium (COLACE) 100 mg oral capsule Take 100 mg by mouth daily   . Ferrous Fumarate (FERRETTS) 325 mg (106 mg iron) oral tablet Take 1 tablet by mouth daily   . insulin aspart (NOVOLOG FLEXPEN) 100 unit/mL subcutaneous flexpen Inject into the  skin 2 times a day before meals Sliding scale   . leflunomide (ARAVA) 10 mg oral tablet Take 10 mg by mouth daily   . metOLazone (ZAROXOLYN) 2.5 mg oral tablet Take 2.5 mg by mouth daily As need per kidney MD   . polyethylene glycol (MIRALAX) 17 gram oral packet Take 17 g by mouth daily   . Saccharomyces boulardii (FLORASTOR) 250 mg oral capsule Take 250 mg by mouth daily   . sacubitril-valsartan (ENTRESTO) 24-26 mg oral tablet Take by mouth 2 times a day   . simvastatin (ZOCOR) 20 mg oral tablet Take 20 mg by mouth at bedtime   . tamsulosin (FLOMAX) 0.4 mg oral extended-release capsule Take 0.4 mg by mouth at bedtime           Dyspnea:  Admits:  With moderate exertion   Edema: Admits Lower Extremity Edema: bilaterally  knee(s)  1+   Orthostasis  denies    pillow orthopnea denies    Paroxysmal nocturnal dyspnea denies    right upper quadrant pain denies    abdominal bloating denies    early satiety denies    nausea denies    anorexia denies    syncope denies    presyncope denies    exertional lightheadedness denies    defibrillator firings denies    palpations denies    chest pain Denies   ethanol denies    tobacco denies    Substance use denies     Risk Factors  Family history of heart failure: Yes. mother  Family history of sudden cardiac death: no  Arrhythmia present?No  Renal Failure:Yes,       - Chronic Kidney Disease  (CKD) Stage 3   Pulmonary Hypertension: no  Anemia: Yes,   Acute  Anemia source: blood loss  Malnutrition: No, dietitian consult      Past Medical History:   Diagnosis Date   . A-fib (CMS HCC)     s/p ablation   . Anemia    . Anticoagulant long-term use     Warfarin   . Arthritis    . CAD (coronary artery disease)     s/p CABG   . Cardiac LV ejection fraction 21-30%    . Cardiomyopathy (CMS [place])     EF 25%   . Cellulitis of lower extremity    . CHF (congestive heart failure) (CMS HCC)    . CKD (chronic kidney disease), stage III (CMS HCC)    . Colonic polyp    . Constipation    .  DM2 (diabetes  mellitus, type 2) (CMS HCC)    . Edema     bilateral lower extremity   . Gout    . History of cellulitis    . HLD (hyperlipidemia)    . HTN (hypertension)    . Morbid obesity (CMS [place])    . Neuropathy (CMS HCC)     bilateral feet   . Osteomyelitis (CMS [place]) 2015    right ankle and was treated with external fixator for 4 months followed by multiple debridements   . Pacemaker    . Urinary retention    . Use of cane as ambulatory aid     states due to his right ankle- also has a walker and wheelchair           Past Surgical History:   Procedure Laterality Date   . HX CARDIAC ABLATION     . HX COLONOSCOPY     . HX CORONARY ARTERY BYPASS GRAFT     . HX PACEMAKER DEFIBRILLATOR PLACEMENT             Patient Active Problem List   Diagnosis   . UTI (urinary tract infection)   . Morbid obesity   . Septic shock (CMS HCC)   . Hypokalemia       Prior to Admission Medications:  Medications Prior to Admission     Prescriptions    allopurinol (ZYLOPRIM) 300 mg Oral Tablet    Take 300 mg by mouth Once a day    aspirin 81 mg Oral Tablet, Chewable    Take 324 mg by mouth Once a day    bumetanide (BUMEX) 2 mg Oral Tablet    Take 2 mg by mouth Twice daily    carvedilol (COREG) 12.5 mg Oral Tablet    Take 12.5 mg by mouth Twice daily with food    colchicine 0.6 mg Oral Tablet    Take 0.6 mg by mouth Every Monday, Wednesday and Friday    digoxin (LANOXIN) 125 mcg Oral Tablet    Take 0.125 mg by mouth Once a day    gabapentin (NEURONTIN) 300 mg Oral Capsule    Take 300 mg by mouth Three times a day Take 600mg  in the morning, followed by 300mg  in the afternoon, then 600mg  in the evening.    Leflunomide (ARAVA) 20 mg Oral Tablet    Take 20 mg by mouth Once a day    magnesium oxide (MAG-OX) 400 mg (241.3 mg magnesium) Oral Tablet    Take 400 mg by mouth Once a day    metFORMIN (GLUCOPHAGE) 500 mg Oral Tablet    Take 500 mg by mouth Twice daily with food    pioglitazone (ACTOS) 15 mg Oral Tablet    Take 15 mg by mouth Once a day    predniSONE  (DELTASONE) 2.5 mg Oral Tablet    Take 2.5 mg by mouth Once a day before lunch    sacubitril-valsartan (ENTRESTO) 49-51 mg Oral Tablet    Take 1 Tab by mouth Twice daily    simvastatin (ZOCOR) 20 mg Oral Tablet    Take 20 mg by mouth Every evening          Past Family History:   Family Medical History:     Problem Relation (Age of Onset)    Arthritis-rheumatoid Father, Brother    Congestive Heart Failure Mother    Coronary Artery Disease Father          Social History  Social  History     Substance and Sexual Activity   Alcohol Use Yes   . Frequency: 2-4 times a month      reports that he quit smoking about 7 years ago. His smoking use included cigarettes. He has never used smokeless tobacco.   reports that he does not use drugs.    ROS: Other than ROS in the HPI, all other systems were negative.      Exam:   Temperature: 36.4 C (97.5 F)  Heart Rate: 78  BP (Non-Invasive): (!) 90/57  Respiratory Rate: 18  SpO2: 98 %  Pain Score (Numeric, Faces): 0    Date 11/19/17 0700 - 11/20/17 0659   Shift 0700-1459 1500-2259 2300-0659 24 Hour Total   INTAKE   P.O. 240   240     Oral 240   240   Shift Total(mL/kg) 240(1.58)   240(1.58)   OUTPUT   Shift Total(mL/kg)       NET 240   240   Weight (kg) 151.95 151.95 151.95 151.95       Current Weight:   No data found.    General:NAD,appears chronically ill  HENT:NC/AT; oral mucous membranesmoist  Eyes:conjunctivaeclear, non-icteric; PERRLA, L lateral upper lid lesion  Neck:trachea midline  Cardiovascular:RRR; normal Z6/X0; holosystolic murmur  Pulmonary / Chest:Normal respiratory effort; CTAB but mild crackles at bilateral bases. NC in place  GI / Abdominal:Morbidly obese, soft, non-distended; BS normal; no TTP  Extremities:bilateral pittingedema;nocyanosis; well-perfused  Skin:Warm and dry, venous stasis dermatitis changes over bilateral legs with hyperpigmentation and some superficial fluid blisters, R>L; wound dressings in place  Neurologic:CN grossly intact.  Mentation improved    LABS:  Results for orders placed or performed during the hospital encounter of 11/11/17 (from the past 24 hour(s))   BASIC METABOLIC PANEL   Result Value Ref Range    SODIUM 140 136 - 145 mmol/L    POTASSIUM 4.1 3.5 - 5.1 mmol/L    CHLORIDE 101 96 - 111 mmol/L    CO2 TOTAL 30 22 - 32 mmol/L    ANION GAP 9 4 - 13 mmol/L    CALCIUM 8.3 (L) 8.5 - 10.2 mg/dL    GLUCOSE 125 65 - 139 mg/dL    BUN 36 (H) 8 - 25 mg/dL    CREATININE 1.00 0.62 - 1.27 mg/dL    BUN/CREA RATIO 36 (H) 6 - 22    ESTIMATED GFR >59 >59 RU/EAV/4.09W^1   BASIC METABOLIC PANEL   Result Value Ref Range    SODIUM 141 136 - 145 mmol/L    POTASSIUM 3.5 3.5 - 5.1 mmol/L    CHLORIDE 101 96 - 111 mmol/L    CO2 TOTAL 33 (H) 22 - 32 mmol/L    ANION GAP 7 4 - 13 mmol/L    CALCIUM 8.5 8.5 - 10.2 mg/dL    GLUCOSE 105 65 - 139 mg/dL    BUN 35 (H) 8 - 25 mg/dL    CREATININE 0.95 0.62 - 1.27 mg/dL    BUN/CREA RATIO 37 (H) 6 - 22    ESTIMATED GFR >59 >59 mL/min/1.56m^2   CBC/DIFF    Narrative    The following orders were created for panel order CBC/DIFF.  Procedure                               Abnormality         Status                     ---------                               -----------         ------  CBC WITH UXNA[[id number]]                Abnormal            Final result                 Please view results for these tests on the individual orders.   MAGNESIUM   Result Value Ref Range    MAGNESIUM 1.9 1.6 - 2.5 mg/dL   PHOSPHORUS   Result Value Ref Range    PHOSPHORUS 3.1 2.3 - 4.0 mg/dL   PT/INR   Result Value Ref Range    PROTHROMBIN TIME 38.5 (H) 9.5 - 14.1 seconds    INR 3.38 (H) 0.80 - 1.20    Narrative    Coumadin therapy INR range for Conventional Anticoagulation is 2.0 to 3.0 and for Intensive Anticoagulation 2.5 to 3.5.   CBC WITH DIFF   Result Value Ref Range    WBC 10.3 3.5 - 11.0 x10^3/uL    RBC 3.55 (L) 4.06 - 5.63 x10^6/uL    HGB 11.3 (L) 12.5 - 16.3 g/dL    HCT 34.0 (L) 36.7 - 47.0 %    MCV 95.7 78.0 -  100.0 fL    MCH 31.7 27.4 - 33.0 pg    MCHC 33.1 32.5 - 35.8 g/dL    RDW 18.4 (H) 12.0 - 15.0 %    PLATELETS 224 140 - 450 x10^3/uL    MPV 7.3 (L) 7.5 - 11.5 fL    NEUTROPHIL % 85 %    LYMPHOCYTE % 5 %    MONOCYTE % 9 %    EOSINOPHIL % 1 %    BASOPHIL % 1 %    NEUTROPHIL # 8.74 (H) 1.50 - 7.70 x10^3/uL    LYMPHOCYTE # 0.51 (L) 1.00 - 4.80 x10^3/uL    MONOCYTE # 0.92 0.30 - 1.00 x10^3/uL    EOSINOPHIL # 0.11 0.00 - 0.50 x10^3/uL    BASOPHIL # 0.06 0.00 - 0.20 x10^3/uL   POC BLOOD GLUCOSE (RESULTS)   Result Value Ref Range    GLUCOSE, POC 182 (H) 70 - 105 mg/dl   POC BLOOD GLUCOSE (RESULTS)   Result Value Ref Range    GLUCOSE, POC 138 (H) 70 - 105 mg/dl   POC BLOOD GLUCOSE (RESULTS)   Result Value Ref Range    GLUCOSE, POC 185 (H) 70 - 105 mg/dl   POC BLOOD GLUCOSE (RESULTS)   Result Value Ref Range    GLUCOSE, POC 117 (H) 70 - 105 mg/dl     Lab Results   Component Value Date    CO2 33 (H) 11/19/2017    CO2 30 11/18/2017    BUN 35 (H) 11/19/2017    BUN 36 (H) 11/18/2017    CREATININE 0.95 11/19/2017    CREATININE 1.00 11/18/2017    WBC 10.3 11/19/2017    WBC 9.6 11/18/2017    HGB 11.3 (L) 11/19/2017    HGB 11.2 (L) 11/18/2017     Lab Results   Component Value Date    AST 36 11/11/2017    AST 35 11/11/2017    ALT 9 11/11/2017    ALT 10 11/11/2017     No results found for: TRIG, HDL  No results found for: TSH  INR (no units)   Date Value   11/19/2017 3.38 (H)   11/18/2017 3.75 (H)   11/17/2017 3.85 (H)         Additional lab testing ordered: none  Chest X-ray:   11/11/2017  IMPRESSION:  Cardiomegaly without acute process.    TTE 11/12/2017:  Findings:  Left Ventricle: Normal left ventricular size. Severely depressed left ventricular systolic function. LV Ejection Fraction  is 25 %. Normal geometry. Paradoxical septal motion consistent with RV pacemaker.  Resting Segmental Wall Motion Analysis: Total wall motion score is 2.24. There is akinesis of the mid to apical  anterolateral wall. There is akinesis of the apical  cap. There is akinesis of the apical inferoseptal wall. The remaining  left ventricular segments demonstrate hypokinesis.  Right Ventricle: Mildly dilated right ventricle. Mildly depressed right ventricular systolic function. Echo density in  right ventricle suggestive of catheter, pacer lead, or ICD lead.  Left Atrium: The left atrium is normal in size.  Right Atrium: Mildly dilated right atrium.  Atrial Septum: The interatrial septum is normal in appearance.  Mitral Valve: There is moderate secondary mitral regurgitation.  Aortic Valve: Normal aortic valve. No aortic regurgitation seen. Trileaflet aortic valve. No Aortic Valve Stenosis.  Tricuspid Valve: There is moderate tricuspid regurgitation.  Pulmonic Valve: Normal pulmonic valve.  Pericardium: Normal pericardium with no pericardial effusion.  Aorta: Normal aortic root.  IVC: The inferior vena cava is dilated.  Pulmonary Artery: Normal pulmonary artery size.      Heart Failure Therapies:   ARN1/ACE-I/ARB: No: Why? hypotension  Beta Blocker: No: Why? hypotension  Aldosterone antagonist: No: Why? hypotension  Device: Yes   CRT-D    Family Communication:   Code Status:  Code Status Information     Code Status    Full Code            Assessment/Recommendations:  Acute on Chronic Systolic CHF. Requiring Cardiogenic Shock: no / ischemic cardiomyopathy/ Mild right ventricular dysfunction / Clinically mildly fluid overloaded on examination/ ACC/AHA Stage D, NYHA functional classIII    Diagnosis   . Essential hypertension, benign   . Pure hypercholesterolemia   . Coronary atherosclerosis of native coronary artery   . Splenomegaly   . Postsurgical percutaneous transluminal coronary angioplasty status   . Cardiomyopathy (Notable Code)   . Dyspnea   . Long term current use of anticoagulant therapy   . Fatigue   . Shortness of breath   . Edema   . Chronic a-fib (Notable Code)   . Ventricular tachycardia seen on cardiac monitor (Notable Code)   . Acute on chronic systolic  heart failure (Notable Code)   . Morbid obesity with BMI of 40.0-44.9, adult (Notable Code)   . CABG x 4 --LIMA->D,LIMA->LAD, SVG->OM, RIMA->PDA on 06/15/14 by Dr.Navid   . AICD (automatic cardioverter/defibrillator) present   . Sleep apnea   . Bacteremia   . Osteomyelitis of ankle (Notable Code)   . Pulmonary HTN (Notable Code)   . Decubitus ulcer of right ankle, stage 4 (Notable Code)   . Primary cardiomyopathy (Notable Code)   . Type 2 diabetes mellitus without complication (Notable Code)   . Encounter for long-term (current) use of medications   . CHF exacerbation (Notable Code)   . Screening for thyroid disorder   . Screening, anemia, deficiency, iron   . Cardiac LV ejection fraction 15% as shown on Echocardiogram of 09-08-2015    Urosepsis  Supratheraputic INR  AKI over CKD stage III  AF on warfarin  DM type II    -  Pt is on Lasix 80 mg TID IV dose and pt still need a lot of diuresis here. He had UOP for 24 hrs for 2600 ml,  negative 1710 ml and negative 13L since admission here.  - Please do Strict I/O, daily weight, fluid restriction and 2 g sodium diet.  - Recommended Lymphedema consult here if not done yet.  - Please do icd ([employer]) interrogation here if not done yet.  - Currently on Entresto 40 -51 mg BID home dose  - Pt will benefits from CPAP at night for his OSA.  - Also pt will benefits from SGLT2 I medication here for DM and CHF management  - Pt is on Bumex 2mg  BID, Entresto 49-51 mg BID and Coreg 12.5 mg BID at home.  Please place consult order in [place] if not done yet.    Thank you for allowing [place] to participate in the care of your patient.  If you have any questions please do not hesitate to call.     Holley Bouche, MD      11/19/2017  I saw and examined the patient.  I reviewed the fellow's note.  I agree with the findings and plan of care as documented in the fellow's note.  Any exceptions/additions are edited/noted.    Noralee Stain, DO

## 2017-11-19 NOTE — Progress Notes (Signed)
INTERNAL MEDICINE  Progress Note     Name: Andrew Arias, Andrew Arias, 62 y.o. male Date of Service: 11/19/2017   BUL:[id number] LOS: 89   PCP:No Pcp Attending: Lurene Shadow, MD   Code Status: Full Code Admitted for: Septic shock (CMS [place])     SUBJECTIVE:      No acute events overnight.  Of note, yesterday afternoon, the patient had a nonsustained 24 be run of V-tach.  Patient was asymptomatic throughout this episode, and denied any chest pain or shortness of breath.  He converted to normal sinus rhythm spontaneously without any firing of pacemaker.  Patient sitting upright in chair this morning, with his feet propped.  He says that he is ready to leave and is feeling better.  He denies any chest pain, pressure, shortness of breath, nausea, vomiting or diarrhea.  No acute events overnight.     OBJECTIVE:    EXAMINATION:  Temperature: 36.4 C (97.5 F) BP (Non-Invasive): 110/78 Heart Rate: 86   Respiratory Rate: 18 SpO2: 92 % Weight: (!) 152 kg (335 lb)          Intake/Output Summary (Last 24 hours) at 11/19/2017 0801  Last data filed at 11/19/2017 0600  Gross per 24 hour   Intake 890 ml   Output 3300 ml   Net -2410 ml     Last Bowel Movement: 11/15/17     General: Well-nourished, well-developed, in NAD.  HEENT: NC/AT, EOMI, Oropharynx clear, mucous membranes moist, Trachea midline.  Cardiovascular:  Systolic murmur; no rubs, gallops.  Respiratory: CTABL  Abdominal: Bowel sounds normal; abdomen soft, non-tender to palpation  Extremities/Skin:  Significant lower extremity right greater than left edema. Warm and dry.  Neurological: Awake, A&O x 3.     LABS:    CBC Differential   Recent Labs     11/17/17  0457 11/18/17  0432 11/19/17  0514   WBC 7.8 9.6 10.3   HGB 11.4* 11.2* 11.3*   HCT 34.6* 33.6* 34.0*   PLTCNT 187 207 224    Recent Labs     11/17/17  0457 11/18/17  0432 11/19/17  0514   PMNS 79 83 85   LYMPHOCYTES 7 5 5    MONOCYTES 12 10 9    EOSINOPHIL 2 1 1    BASOPHILS 1  0.06 0  0.04 1  0.06   PMNABS 6.17 8.03* 8.74*      LYMPHSABS 0.50* 0.46* 0.51*   MONOSABS 0.91 1.00 0.92   EOSABS 0.15 0.11 0.11      BMP LFTs   Recent Labs     11/18/17  1842 11/19/17  0514   SODIUM 140 141   POTASSIUM 4.1 3.5   CHLORIDE 101 101   CO2 30 33*   BUN 36* 35*   CREATININE 1.00 0.95   GLUCOSENF 125 105   ANIONGAP 9 7   BUNCRRATIO 36* 37*   GFR >59 >59   CALCIUM 8.3* 8.5   MAGNESIUM  --  1.9   PHOSPHORUS  --  3.1    No results found for this encounter   CoAgs Blood Gas:   Recent Labs     11/19/17  0514   PROTHROMTME 38.5*   INR 3.38*    No results found for this encounter    Cardiac Markers Lipid Panel   No results for input(s): TROPONINI, CKMB, MBINDEX, BNP in the last 72 hours. No results found for this encounter     IMAGING STUDIES:  PATHOLOGY & MICROBIOLOGY:    No results found for any visits on 11/11/17 (from the past 24 hour(s)).    INPATIENT MEDICATIONS:    Current Facility-Administered Medications:  acetaminophen (TYLENOL) tablet 650 mg Oral Q4H PRN   aspirin chewable tablet 81 mg 81 mg Oral Daily   atorvastatin (LIPITOR) tablet 80 mg Oral QPM   collagenase (SANTYL) 250 unit/gm ointment  Apply Topically Daily   digoxin (LANOXIN) tablet 125 mcg Oral Daily   docusate sodium (COLACE) capsule 100 mg Oral 2x/day   furosemide (LASIX) 10 mg/mL injection 80 mg Intravenous Q8HRS   insulin NPH human 100 units/mL injection 10 Units Subcutaneous 2x/day AC   insulin R human 100 units/mL injection 2 Units Subcutaneous 3x/day AC   lidocaine-menthol (LIDOPATCH) 3.6%-1.25% patch 1 Patch Transdermal Daily   magnesium oxide (MAG-OX) tablet 400 mg Oral 2x/day   NS flush syringe 2 mL Intracatheter Q8HRS   And      NS flush syringe 2-6 mL Intracatheter Q1 MIN PRN   nystatin (MYCOSTATIN) 100,000 units per mL oral liquid 5 mL Swish & Spit 4x/day   nystatin (NYSTOP) 100,000 units/g topical powder  Apply Topically 2x/day   ondansetron (ZOFRAN) 2 mg/mL injection 4 mg Intravenous Q8H PRN   phenol (CHLORASEPTIC) 1.4% oromucosal spray 1 Spray Mouth/Throat Q4H PRN    potassium bicarbonate-citric acid (EFFER-K) effervescent tablet 40 mEq Oral 2x/day-Food   predniSONE (DELTASONE) tablet 2.5 mg Oral Daily with Breakfast   sacubitril-valsartan (ENTRESTO) 49-51 mg per tablet 1 Tab Oral 2x/day   sennosides-docusate sodium (SENOKOT-S) 8.6-50mg  per tablet 1 Tab Oral 2x/day   SSIP insulin R human (HUMULIN R) 100 units/mL injection 2-6 Units Subcutaneous 4x/day PRN        ASSESSMENT:     CHF, uncontrolled DM 2, atrial fibrillation status post ablation on warfarin for anticoagulation, paced with AICD, CKD 3, gout, and RA who presented originally from [facility] for shock, now as a MICU transfer to the medicine service.    Active Hospital Problems    Diagnosis   . Primary Problem: Septic shock (CMS HCC)   . Hypokalemia   . UTI (urinary tract infection)        PLAN:   Shock, most likely combined septic/cardiogenic secondary to UTI/pyelonephritis versus lower extremity cellulitis  - blood cultures, urine cultures no growth   -patient initially treated with linezolid, cefepime; de-escalated to Rocephin for total 7 day course on 11/13/2017, with stop date of 11/17/2017.   -Levophed discontinued on 11/14/2016  -Concern for bladder diverticula/uretheral stricture on initial presentation. Urology consulted; foley in place  -stress dose steroids weaned to home prednisone 2.5 mg  (patient takes for RA on outpatient basis)    Acute on chronic CHF exacerbation  A-fib on warfarin- supratherapeutic INR   - TTE; EF 25% with multiple regions of marked akinesis/hypokinesis  - cardiology HF consulted; appreciate recs  - Cardiology blue following pt; restarted entresto; switched bumex to lasix 80 mg TID po for 1.5 days. Switching po to IV for increased diuresis  - will continue to monitor BMP q12 hr.  -aggressively replacing potassium 4 goal greater than 4.    - 2.4 net L urine output yesterday.  Will continue to monitor urine output.    - Continue dig  - Holding beta blocker  - Holding warfarin  due to supra therapeutic INR   - Aspirin and lipitor 80 mg  - lymphedema consult ordered; appreciate recs.  -spoke with cardiology follow-up concerning episode of asymptomatic nonsustained run  of V-tach, and was ensured by Cardiology that his pacer is not set to fire if a run of V-tach is this short.  We have low suspicion for pacer malfunction at this time as it was interrogated by EPS in the MICU, and longer runs would result in pacer firing.  Will continue to monitor.    Bladder dome lesion:  - CT renal calculus protocol and cystogram reviewed  - urology consulted  - s/p uretheral stricture dilation  - no additional imaging requiring  - pt will need f/u cystoscopy outpatient; urology has referred (3 week follow-up; orders placed in epic)  - pt to be d/c with foley    AKI on CKD III (improving):  - likely 2/2 septic shock; per report, baseline Cr ~1.6; was 3.6 at OSH  - urine lytes suggest pre-renal etioogy  - strict I/Os  -creatinine 1.41 at time of transfer.  -will continue to monitor with daily BMPs.    Probable left psoas hematoma:  - noted on CT 3/18, asymptomatic  - no hx recent trauma; likely 2/2 supratherapeutic INR  -will monitor.    Lymphedema  lower extremity wounds:  - wound careand lymphedema OTconsulted; appreciate recs    DM2, uncontrolled:  - A1c 8.2  - hold home PO agents  - SSI  - Will initiate NPH 10 units BID and regular insulin to make it more affordable for the patient  - Plan to discharge patient on jardiance (care management contacted to help with coupons since cost is an issue for the patient)    Back pain  - patient complaining of back pain this morning, offered lidocaine patches.        Chronic medical problems:  - CAD s/p CABG  HTN  HLD: hold coreg  - gout: hold allopurinol, colchicine  - BPH: hold tamsulosin  - Hypomagnesemia: hold PO supplement, replace prn  - UX:[id number],  holdleflunamide    ____________________________________________________________________    Prophylaxis:  - DVT: held d/t INR  - GI: Not indicated  Analgesia:None  Nutrition:Diabetic and Cardiac  Therapy: OT and PT  Bowel regimen:senna, colace      Joshua C. Lurline Hare MD, PhD   PGY1, Transitional Year Resident   Pager [phone]      I saw and examined the patient.  I reviewed the resident's note.  I agree with the findings and plan of care as documented in the resident's note.  Any exceptions/additions are edited/noted.    Lurene Shadow, MD

## 2017-11-19 NOTE — Nurses Notes (Signed)
Tele reported pacer quit firing and patient had a 24 beat run of vtach. Patient asymptomatic at this time. Service notified. Will continue to monitor.

## 2017-11-19 NOTE — Care Plan (Deleted)
Pt calm and cooperative, A/Ox  4, per baseline. Went for an echo, and diet was changed from NPO to regular. Pt gets very upset waiting on his food. Uses call bell to effectively make needs known. Hourly rounding completed per RN and CA, to make sure pt's needs are effectively met. VS and assessments documented per flowsheets. Plan of care discussed with pt. No verbalized questions at this time. Will continue to monitor   Problem: Adult Inpatient Plan of Care  Goal: Patient-Specific Goal (Individualization)  11/19/2017 1816 by Malcolm Metro, RN  Outcome: Ongoing (see interventions/notes)  Flowsheets (Taken 11/19/2017 1815)  Individualized Care Needs: Pt wants to be able to have a regular diet and "eat normal food"

## 2017-11-20 LAB — CBC WITH DIFF
BASOPHIL #: 0.06 x10ˆ3/uL (ref 0.00–0.20)
BASOPHIL %: 1 %
EOSINOPHIL #: 0.08 x10ˆ3/uL (ref 0.00–0.50)
EOSINOPHIL %: 1 %
HGB: 10.6 g/dL — ABNORMAL LOW (ref 12.5–16.3)
LYMPHOCYTE #: 0.51 x10ˆ3/uL — ABNORMAL LOW (ref 1.00–4.80)
LYMPHOCYTE %: 6 %
MCH: 32 pg (ref 27.4–33.0)
MCHC: 33.3 g/dL (ref 32.5–35.8)
MCV: 96.1 fL (ref 78.0–100.0)
MONOCYTE %: 12 %
MPV: 7.4 fL — ABNORMAL LOW (ref 7.5–11.5)
NEUTROPHIL #: 7.01 x10ˆ3/uL (ref 1.50–7.70)
NEUTROPHIL %: 81 %
PLATELETS: 204 x10ˆ3/uL (ref 140–450)
RBC: 3.32 x10ˆ6/uL — ABNORMAL LOW (ref 4.06–5.63)
RDW: 18.6 % — ABNORMAL HIGH (ref 12.0–15.0)
WBC: 8.7 x10ˆ3/uL (ref 3.5–11.0)

## 2017-11-20 LAB — BASIC METABOLIC PANEL
ANION GAP: 10 mmol/L (ref 4–13)
BUN: 29 mg/dL — ABNORMAL HIGH (ref 8–25)
CALCIUM: 8.1 mg/dL — ABNORMAL LOW (ref 8.5–10.2)
CHLORIDE: 100 mmol/L (ref 96–111)
CO2 TOTAL: 28 mmol/L (ref 22–32)
CREATININE: 0.82 mg/dL (ref 0.62–1.27)
ESTIMATED GFR: >59 mL/min/1.73mˆ2 (ref >59)
GLUCOSE: 89 mg/dL (ref 65–139)
POTASSIUM: 3.7 mmol/L (ref 3.5–5.1)
SODIUM: 138 mmol/L (ref 136–145)
SODIUM: 138 mmol/L (ref 136–145)

## 2017-11-20 LAB — MAGNESIUM: MAGNESIUM: 1.7 mg/dL (ref 1.6–2.5)

## 2017-11-20 LAB — POC BLOOD GLUCOSE (RESULTS)
GLUCOSE, POC: 106 mg/dl — ABNORMAL HIGH (ref 70–105)
GLUCOSE, POC: 161 mg/dl — ABNORMAL HIGH (ref 70–105)
GLUCOSE, POC: 104 mg/dl (ref 70–105)

## 2017-11-20 LAB — PT/INR: INR: 2.99 — ABNORMAL HIGH (ref 0.80–1.20)

## 2017-11-20 LAB — PHOSPHORUS: PHOSPHORUS: 2.7 mg/dL (ref 2.3–4.0)

## 2017-11-20 MED ORDER — POTASSIUM BICARBONATE-CITRIC ACID 20 MEQ EFFERVESCENT TABLET
40.0000 meq | EFFERVESCENT_TABLET | Freq: Two times a day (BID) | ORAL | Status: AC
Start: 2017-11-20 — End: 2017-11-20
  Administered 2017-11-20 (×3): 40 meq via ORAL
  Filled 2017-11-20 (×2): qty 2

## 2017-11-20 MED ORDER — TORSEMIDE 20 MG TABLET
50.00 mg | ORAL_TABLET | Freq: Two times a day (BID) | ORAL | Status: DC
Start: 2017-11-20 — End: 2017-11-22
  Administered 2017-11-20 – 2017-11-22 (×4): 50 mg via ORAL
  Filled 2017-11-20 (×5): qty 1

## 2017-11-20 MED ORDER — TRAMADOL 50 MG TABLET
50.0000 mg | ORAL_TABLET | Freq: Four times a day (QID) | ORAL | Status: DC | PRN
Start: 2017-11-20 — End: 2017-11-22
  Administered 2017-11-20 – 2017-11-22 (×7): 50 mg via ORAL
  Filled 2017-11-20 (×7): qty 1

## 2017-11-20 MED ORDER — PHENAZOPYRIDINE 100 MG TABLET
100.00 mg | ORAL_TABLET | Freq: Three times a day (TID) | ORAL | Status: AC
Start: 2017-11-20 — End: 2017-11-22
  Administered 2017-11-20 – 2017-11-22 (×5): 100 mg via ORAL
  Filled 2017-11-20 (×6): qty 1

## 2017-11-20 MED ADMIN — sodium chloride 0.9 % (flush) injection syringe: TOPICAL | @ 08:00:00

## 2017-11-20 MED ADMIN — lactated Ringers intravenous solution: @ 22:00:00 | NDC 00338011704

## 2017-11-20 MED ADMIN — lactated Ringers intravenous solution: ORAL | @ 08:00:00 | NDC 00338011704

## 2017-11-20 NOTE — Pharmacy (Signed)
[facility] [employer] [facility]  Therapeutic Drug Monitoring: Warfarin Progress Note    Andrew Arias is a 62 y.o. year old male on warfarin for afib     Goal INR: 2-3    Home dose: 5 mg nightly per patient    Outpatient INR Management: Unknown cardiologist in [place], [place] (will need to follow up)    Inpatient Management: Medicine 1    Concurrent Antiplatelet Agents: aspirin    Inpatient Warfarin Dosing:     Date INR Warfarin Dose   (@ 21:00) Bridging Regimen Drug Interactions Comments   03/17 2.23 5 mg      03/18 2.96 Held      03/19 3.78 Held  leflunomide may increase INR    03/20 4.13 Held      03/21 4.03 Held      03/22 4.16 Held      03/23 3.85 Held      03/24 3.75 Held      03/25 3.38 Held      03/26 2.99 (Hold)                Assessment/Plan:  Andrew Arias 's INR today is therapeutic    Pharmacist recommendation: Continue to hold warfarin given very slow downtrend after only one dose. When warfarin deemed safe to restart, consider dosing very cautiously at a much lower dose. Per patient cannot afford DOACs.      Monitoring:  Please continue to monitor PT/INR daily.  Monitor for signs/symptoms of bleeding.   Pharmacist will continue to make daily warfarin recommendations.   Please contact pharmacy with any questions.

## 2017-11-20 NOTE — Progress Notes (Signed)
INTERNAL MEDICINE  Progress Note     Name: Andrew Arias, Andrew Arias, 62 y.o. male Date of Service: 11/20/2017   ZOX:[id number] LOS: 9   PCP:No Pcp Attending: Lurene Shadow, MD   Code Status: Full Code Admitted for: Septic shock (CMS [facility])     SUBJECTIVE:      Andrew Arias was sitting upright this morning with his feet propped on a foot stool.  He says that he has no complaints apart from his back pain which he attributes to lying in bed, relieved by sitting upright in his chair.  He says that his lidocaine patches helped somewhat, but typically follow-up.  He has no complaints at this time denies any chest pain, pressure, shortness of breath, nausea, vomiting or diarrhea.     OBJECTIVE:    EXAMINATION:  Temperature: 36.4 C (97.5 F) BP (Non-Invasive): (!) 89/46 Heart Rate: 78   Respiratory Rate: 16 SpO2: 96 % Weight: (!) 149.9 kg (330 lb 7.5 oz)          Intake/Output Summary (Last 24 hours) at 11/20/2017 0749  Last data filed at 11/20/2017 0700  Gross per 24 hour   Intake 540 ml   Output 3550 ml   Net -3010 ml     Last Bowel Movement: 11/16/17     General: Well-nourished, well-developed, in NAD.  HEENT: NC/AT, EOMI, Oropharynx clear, mucous membranes moist, Trachea midline.  Cardiovascular:  Systolic murmur; no rubs, gallops.  Respiratory: CTABL  Abdominal: Bowel sounds normal; abdomen soft, non-tender to palpation  Extremities/Skin:  Significant lower extremity right greater than left edema. Warm and dry.  Neurological: Awake, A&O x 3.     LABS:    CBC Differential   Recent Labs     11/18/17  0432 11/19/17  0514 11/20/17  0421   WBC 9.6 10.3 8.7   HGB 11.2* 11.3* 10.6*   HCT 33.6* 34.0* 31.9*   PLTCNT 207 224 204    Recent Labs     11/18/17  0432 11/19/17  0514 11/20/17  0421   PMNS 83 85 81   LYMPHOCYTES 5 5 6    MONOCYTES 10 9 12    EOSINOPHIL 1 1 1    BASOPHILS 0  0.04 1  0.06 1  0.06   PMNABS 8.03* 8.74* 7.01   LYMPHSABS 0.46* 0.51* 0.51*   MONOSABS 1.00 0.92 1.03*   EOSABS 0.11 0.11 0.08      BMP LFTs   Recent Labs      11/19/17  1547 11/20/17  0421   SODIUM 139 138   POTASSIUM 4.2 3.7   CHLORIDE 100 100   CO2 30 28   BUN 34* 29*   CREATININE 0.99 0.82   GLUCOSENF 133 89   ANIONGAP 9 10   BUNCRRATIO 34* 35*   GFR >59 >59   CALCIUM 8.5 8.1*   MAGNESIUM  --  1.7   PHOSPHORUS  --  2.7    No results found for this encounter   CoAgs Blood Gas:   Recent Labs     11/20/17  0421   PROTHROMTME 34.2*   INR 2.99*    No results found for this encounter    Cardiac Markers Lipid Panel   No results for input(s): TROPONINI, CKMB, MBINDEX, BNP in the last 72 hours. No results found for this encounter     IMAGING STUDIES:         PATHOLOGY & MICROBIOLOGY:    No results found for any visits on 11/11/17 (from the  past 24 hour(s)).    INPATIENT MEDICATIONS:    Current Facility-Administered Medications:  acetaminophen (TYLENOL) tablet 650 mg Oral Q4H PRN   aspirin chewable tablet 81 mg 81 mg Oral Daily   atorvastatin (LIPITOR) tablet 80 mg Oral QPM   collagenase (SANTYL) 250 unit/gm ointment  Apply Topically Daily   digoxin (LANOXIN) tablet 125 mcg Oral Daily   docusate sodium (COLACE) capsule 100 mg Oral 2x/day   furosemide (LASIX) 10 mg/mL injection 80 mg Intravenous Q8HRS   insulin NPH human 100 units/mL injection 10 Units Subcutaneous 2x/day AC   insulin R human 100 units/mL injection 2 Units Subcutaneous 3x/day AC   lidocaine-menthol (LIDOPATCH) 3.6%-1.25% patch 1 Patch Transdermal Daily   magnesium oxide (MAG-OX) tablet 400 mg Oral 2x/day   NS flush syringe 2 mL Intracatheter Q8HRS   And      NS flush syringe 2-6 mL Intracatheter Q1 MIN PRN   nystatin (MYCOSTATIN) 100,000 units per mL oral liquid 5 mL Swish & Spit 4x/day   nystatin (NYSTOP) 100,000 units/g topical powder  Apply Topically 2x/day   ondansetron (ZOFRAN) 2 mg/mL injection 4 mg Intravenous Q8H PRN   phenol (CHLORASEPTIC) 1.4% oromucosal spray 1 Spray Mouth/Throat Q4H PRN   polyethylene glycol (MIRALAX) oral packet 17 g Oral Daily   potassium bicarbonate-citric acid (EFFER-K)  effervescent tablet 40 mEq Oral 2x/day-Food   predniSONE (DELTASONE) tablet 2.5 mg Oral Daily with Breakfast   sacubitril-valsartan (ENTRESTO) 49-51 mg per tablet 1 Tab Oral 2x/day   sennosides-docusate sodium (SENOKOT-S) 8.6-50mg  per tablet 1 Tab Oral 2x/day   SSIP insulin R human (HUMULIN R) 100 units/mL injection 2-6 Units Subcutaneous 4x/day PRN        ASSESSMENT:     CHF, uncontrolled DM 2, atrial fibrillation status post ablation on warfarin for anticoagulation, paced with AICD, CKD 3, gout, and RA who presented originally from [facility] for shock, now as a MICU transfer to the medicine service.    Active Hospital Problems    Diagnosis   . Primary Problem: Septic shock (CMS HCC)   . Hypokalemia   . UTI (urinary tract infection)        PLAN:   Shock, most likely combined septic/cardiogenic secondary to UTI/pyelonephritis versus lower extremity cellulitis  - blood cultures, urine cultures no growth   -patient initially treated with linezolid, cefepime; de-escalated to Rocephin for total 7 day course on 11/13/2017, with stop date of 11/17/2017.   -Levophed discontinued on 11/14/2016  -Concern for bladder diverticula/uretheral stricture on initial presentation. Urology consulted; foley in place  -stress dose steroids weaned to home prednisone 2.5 mg  (patient takes for RA on outpatient basis)    Acute on chronic CHF exacerbation  A-fib on warfarin- supratherapeutic INR   - TTE; EF 25% with multiple regions of marked akinesis/hypokinesis  - cardiology HF consulted; appreciate recs  - Cardiology blue following pt; restarted entresto; switched bumex to lasix 80 mg TID po for 1.5 days. Switching po to IV for increased diuresis  - lasix d/c on 3/26 switched to torsemide 50 mg bid today.   - will continue to monitor BMP q12 hr.  -aggressively replacing potassium 4 goal greater than 4.    - 2.4 net L urine output yesterday.  Will continue to monitor urine output.    - Continue dig  - Holding beta blocker  -  Holding warfarin due to supra therapeutic INR -INR decreased this morning to 2.99  - Aspirin and lipitor 80 mg  - lymphedema consult  ordered; appreciate recs.  -spoke with cardiology follow-up concerning episode of asymptomatic nonsustained run of V-tach, and was ensured by Cardiology that his pacer is not set to fire if a run of V-tach is this short.  We have low suspicion for pacer malfunction at this time as it was interrogated by EPS in the MICU, and longer runs would result in pacer firing.  Will continue to monitor.    Back pain  - patient attributes lumbar back pain to discomfort in his bed  - not significantly relied by lidocaine patches   - starting tramadol 50 mg q6 hr prn    Bladder dome lesion:  - CT renal calculus protocol and cystogram reviewed  - urology consulted  - s/p uretheral stricture dilation  - no additional imaging requiring  - pt will need f/u cystoscopy outpatient; urology has referred (3 week follow-up; orders placed in epic)  - pt to be d/c with foley  - patient complains of feelings of bladder spams; starting patient on 2 day course of pyridium.     AKI on CKD III (improving):  - likely 2/2 septic shock; per report, baseline Cr ~1.6; was 3.6 at OSH  - urine lytes suggest pre-renal etioogy  - strict I/Os  -creatinine 1.41 at time of transfer.  -will continue to monitor with daily BMPs.    Probable left psoas hematoma:  - noted on CT 3/18, asymptomatic  - no hx recent trauma; likely 2/2 supratherapeutic INR  -will monitor.    Lymphedema  lower extremity wounds:  - wound careand lymphedema OTconsulted; appreciate recs    DM2, uncontrolled:  - A1c 8.2  - hold home PO agents  - SSI  - Will initiate NPH 10 units BID and regular insulin to make it more affordable for the patient  - Plan to discharge patient on jardiance (care management contacted to help with coupons since cost is an issue for the patient)    Back pain  - patient complaining of back pain this morning, offered lidocaine  patches.     Placement pending       Chronic medical problems:  - CAD s/p CABG  HTN  HLD: hold coreg  - gout: hold allopurinol, colchicine  - BPH: hold tamsulosin  - Hypomagnesemia: hold PO supplement, replace prn  - [id number], holdleflunamide    ____________________________________________________________________    Prophylaxis:  - DVT: held d/t INR  - GI: Not indicated  Analgesia:None  Nutrition:Diabetic and Cardiac  Therapy: OT and PT  Bowel regimen:senna, colace      Joshua C. Lurline Hare MD, PhD   PGY1, Transitional Year Resident   Pager [phone]      I saw and examined the patient.  I reviewed the resident's note.  I agree with the findings and plan of care as documented in the resident's note.  Any exceptions/additions are edited/noted.    Lurene Shadow, MD

## 2017-11-20 NOTE — Care Management Notes (Signed)
[facility]  Care Management Note    Patient Name: Andrew Arias  Date of Birth: 15-Nov-1955  Sex: male  Date/Time of Admission: 11/11/2017  7:20 AM  Room/Bed: 805/A  Payor: [place] / Plan: [place] [facility] / Product Type: MEDICARE [facility] /    LOS: 9 days   Primary Care Providers:  Pcp, No (General)    Admitting Diagnosis:  UTI (urinary tract infection) [N39.0]    Assessment:      11/20/17 1407   Assessment Details   Assessment Type Continued Assessment   Date of Care Management Update 11/20/17   Date of Next DCP Update 11/22/17   Medicare Intent to Discharge Documentation   IMM explained/reviewed with:  Patient   Care Management Plan   Discharge Planning Status plan in progress   Projected Discharge Date 11/22/17   Discharge Needs Assessment   Discharge Facility/Level of Care Needs SNF Placement (Medicare certified)(code 3)       Discharge Plan:  SNF Placement (Medicare certified) (code 3), SNF Return (Medicare certified) (code 3)  MSW received call [facility] reporting that they do not have any beds currently available. [facility] unable to accept pt due to Lymphedema therapy. [facility] is not par with pt's insurance. MSW awaiting response from [facility]. Pt provided verbal consent to send referrals to [facility] and [facility] facilities.     The patient will continue to be evaluated for developing discharge needs.     Case Manager: Theodis Aguas  Phone: [phone]

## 2017-11-20 NOTE — Consults (Signed)
[place]  Follow up Note:    [place], Ravenscroft, 62 y.o. male  Date of Service: 11/20/2017  Date of Birth: 12-29-1955  LOS: 9 days  Requesting Provider/Service: MICU 2    Reason for Admission: Heart Failure        Subjective:      No overnight event including CP or new SOB. Pt is sitting in the chair and told that his leg swelling is getting better.        Past Medical History:   Diagnosis Date   . A-fib (CMS HCC)     s/p ablation   . Anemia    . Anticoagulant long-term use     Warfarin   . Arthritis    . CAD (coronary artery disease)     s/p CABG   . Cardiac LV ejection fraction 21-30%    . Cardiomyopathy (CMS [place])     EF 25%   . Cellulitis of lower extremity    . CHF (congestive heart failure) (CMS HCC)    . CKD (chronic kidney disease), stage III (CMS HCC)    . Colonic polyp    . Constipation    . DM2 (diabetes mellitus, type 2) (CMS HCC)    . Edema     bilateral lower extremity   . Gout    . History of cellulitis    . HLD (hyperlipidemia)    . HTN (hypertension)    . Morbid obesity (CMS [place])    . Neuropathy (CMS HCC)     bilateral feet   . Osteomyelitis (CMS [place]) 2015    right ankle and was treated with external fixator for 4 months followed by multiple debridements   . Pacemaker    . Urinary retention    . Use of cane as ambulatory aid     states due to his right ankle- also has a walker and wheelchair           Past Surgical History:   Procedure Laterality Date   . HX CARDIAC ABLATION     . HX COLONOSCOPY     . HX CORONARY ARTERY BYPASS GRAFT     . HX PACEMAKER DEFIBRILLATOR PLACEMENT             Patient Active Problem List   Diagnosis   . UTI (urinary tract infection)   . Morbid obesity   . Septic shock (CMS HCC)   . Hypokalemia       Prior to Admission Medications:  Medications Prior to Admission     Prescriptions    allopurinol (ZYLOPRIM) 300 mg Oral Tablet    Take 300 mg by mouth Once a day    aspirin 81 mg Oral Tablet, Chewable    Take 324 mg by mouth Once a day     bumetanide (BUMEX) 2 mg Oral Tablet    Take 2 mg by mouth Twice daily    carvedilol (COREG) 12.5 mg Oral Tablet    Take 12.5 mg by mouth Twice daily with food    colchicine 0.6 mg Oral Tablet    Take 0.6 mg by mouth Every Monday, Wednesday and Friday    digoxin (LANOXIN) 125 mcg Oral Tablet    Take 0.125 mg by mouth Once a day    gabapentin (NEURONTIN) 300 mg Oral Capsule    Take 300 mg by mouth Three times a day Take 600mg  in the morning, followed by 300mg  in the afternoon, then 600mg   in the evening.    Leflunomide (ARAVA) 20 mg Oral Tablet    Take 20 mg by mouth Once a day    magnesium oxide (MAG-OX) 400 mg (241.3 mg magnesium) Oral Tablet    Take 400 mg by mouth Once a day    metFORMIN (GLUCOPHAGE) 500 mg Oral Tablet    Take 500 mg by mouth Twice daily with food    pioglitazone (ACTOS) 15 mg Oral Tablet    Take 15 mg by mouth Once a day    predniSONE (DELTASONE) 2.5 mg Oral Tablet    Take 2.5 mg by mouth Once a day before lunch    sacubitril-valsartan (ENTRESTO) 49-51 mg Oral Tablet    Take 1 Tab by mouth Twice daily    simvastatin (ZOCOR) 20 mg Oral Tablet    Take 20 mg by mouth Every evening          Past Family History:   Family Medical History:     Problem Relation (Age of Onset)    Arthritis-rheumatoid Father, Brother    Congestive Heart Failure Mother    Coronary Artery Disease Father          Social History  Social History     Substance and Sexual Activity   Alcohol Use Yes   . Frequency: 2-4 times a month      reports that he quit smoking about 7 years ago. His smoking use included cigarettes. He has never used smokeless tobacco.   reports that he does not use drugs.    ROS: Other than ROS in the HPI, all other systems were negative.      Exam:   Temperature: 36.4 C (97.5 F)  Heart Rate: 75  BP (Non-Invasive): (!) 107/92  Respiratory Rate: 16  SpO2: 94 %  Pain Score (Numeric, Faces): 7    Date 11/20/17 0700 - 11/21/17 0659   Shift 0700-1459 1500-2259 2300-0659 24 Hour Total   INTAKE   P.O. 120   120      Oral 120   120   Other 0   0     Irrigate (Foley Catheter) 0   0   Shift Total(mL/kg) 120(0.8)   120(0.8)   OUTPUT   Urine(mL/kg/hr) 750   750   Shift Total(mL/kg) 750(5)   750(5)   NET -630   -630   Weight (kg) 149.9 149.9 149.9 149.9       Current Weight:   Patient Vitals for the past 48 hrs:   Weight   11/20/17 0700 (!) 149.9 kg (330 lb 7.5 oz)       General:NAD,appears chronically ill  HENT:NC/AT; oral mucous membranesmoist  Eyes:conjunctivaeclear, non-icteric; PERRLA, L lateral upper lid lesion  Neck:trachea midline  Cardiovascular:RRR; normal R6/E4; holosystolic murmur  Pulmonary / Chest:Normal respiratory effort; CTAB but mild crackles at bilateral bases. NC in place  GI / Abdominal:Morbidly obese, soft, non-distended; BS normal; no TTP  Extremities:bilateral pitting edema now only pedal edema only;nocyanosis; well-perfused  Skin:Warm and dry, venous stasis dermatitis changes over bilateral legs with hyperpigmentation and some superficial fluid blisters, R>L; wound dressings in place  Neurologic:CN grossly intact. Mentation improved    LABS:  Results for orders placed or performed during the hospital encounter of 11/11/17 (from the past 24 hour(s))   BASIC METABOLIC PANEL   Result Value Ref Range    SODIUM 139 136 - 145 mmol/L    POTASSIUM 4.2 3.5 - 5.1 mmol/L    CHLORIDE 100 96 - 111 mmol/L  CO2 TOTAL 30 22 - 32 mmol/L    ANION GAP 9 4 - 13 mmol/L    CALCIUM 8.5 8.5 - 10.2 mg/dL    GLUCOSE 133 65 - 139 mg/dL    BUN 34 (H) 8 - 25 mg/dL    CREATININE 0.99 0.62 - 1.27 mg/dL    BUN/CREA RATIO 34 (H) 6 - 22    ESTIMATED GFR >59 >59 BW/GYK/5.99J^5   BASIC METABOLIC PANEL   Result Value Ref Range    SODIUM 138 136 - 145 mmol/L    POTASSIUM 3.7 3.5 - 5.1 mmol/L    CHLORIDE 100 96 - 111 mmol/L    CO2 TOTAL 28 22 - 32 mmol/L    ANION GAP 10 4 - 13 mmol/L    CALCIUM 8.1 (L) 8.5 - 10.2 mg/dL    GLUCOSE 89 65 - 139 mg/dL    BUN 29 (H) 8 - 25 mg/dL    CREATININE 0.82 0.62 - 1.27 mg/dL    BUN/CREA RATIO  35 (H) 6 - 22    ESTIMATED GFR >59 >59 mL/min/1.72m^2   CBC/DIFF    Narrative    The following orders were created for panel order CBC/DIFF.  Procedure                               Abnormality         Status                     ---------                               -----------         ------                     CBC WITH TSVX[[id number]]                Abnormal            Final result                 Please view results for these tests on the individual orders.   MAGNESIUM   Result Value Ref Range    MAGNESIUM 1.7 1.6 - 2.5 mg/dL   PHOSPHORUS   Result Value Ref Range    PHOSPHORUS 2.7 2.3 - 4.0 mg/dL   PT/INR   Result Value Ref Range    PROTHROMBIN TIME 34.2 (H) 9.5 - 14.1 seconds    INR 2.99 (H) 0.80 - 1.20    Narrative    Coumadin therapy INR range for Conventional Anticoagulation is 2.0 to 3.0 and for Intensive Anticoagulation 2.5 to 3.5.   CBC WITH DIFF   Result Value Ref Range    WBC 8.7 3.5 - 11.0 x10^3/uL    RBC 3.32 (L) 4.06 - 5.63 x10^6/uL    HGB 10.6 (L) 12.5 - 16.3 g/dL    HCT 31.9 (L) 36.7 - 47.0 %    MCV 96.1 78.0 - 100.0 fL    MCH 32.0 27.4 - 33.0 pg    MCHC 33.3 32.5 - 35.8 g/dL    RDW 18.6 (H) 12.0 - 15.0 %    PLATELETS 204 140 - 450 x10^3/uL    MPV 7.4 (L) 7.5 - 11.5 fL    NEUTROPHIL % 81 %    LYMPHOCYTE % 6 %  MONOCYTE % 12 %    EOSINOPHIL % 1 %    BASOPHIL % 1 %    NEUTROPHIL # 7.01 1.50 - 7.70 x10^3/uL    LYMPHOCYTE # 0.51 (L) 1.00 - 4.80 x10^3/uL    MONOCYTE # 1.03 (H) 0.30 - 1.00 x10^3/uL    EOSINOPHIL # 0.08 0.00 - 0.50 x10^3/uL    BASOPHIL # 0.06 0.00 - 0.20 x10^3/uL   POC BLOOD GLUCOSE (RESULTS)   Result Value Ref Range    GLUCOSE, POC 151 (H) 70 - 105 mg/dl   POC BLOOD GLUCOSE (RESULTS)   Result Value Ref Range    GLUCOSE, POC 170 (H) 70 - 105 mg/dl   POC BLOOD GLUCOSE (RESULTS)   Result Value Ref Range    GLUCOSE, POC 108 (H) 70 - 105 mg/dl   POC BLOOD GLUCOSE (RESULTS)   Result Value Ref Range    GLUCOSE, POC 106 (H) 70 - 105 mg/dl     Lab Results   Component Value Date    CO2 28  11/20/2017    CO2 30 11/19/2017    BUN 29 (H) 11/20/2017    BUN 34 (H) 11/19/2017    CREATININE 0.82 11/20/2017    CREATININE 0.99 11/19/2017    WBC 8.7 11/20/2017    WBC 10.3 11/19/2017    HGB 10.6 (L) 11/20/2017    HGB 11.3 (L) 11/19/2017     Lab Results   Component Value Date    AST 36 11/11/2017    AST 35 11/11/2017    ALT 9 11/11/2017    ALT 10 11/11/2017     No results found for: TRIG, HDL  No results found for: TSH  INR (no units)   Date Value   11/20/2017 2.99 (H)   11/19/2017 3.38 (H)   11/18/2017 3.75 (H)         Additional lab testing ordered: none    Chest X-ray:   11/11/2017  IMPRESSION:  Cardiomegaly without acute process.    TTE 11/12/2017:  Findings:  Left Ventricle: Normal left ventricular size. Severely depressed left ventricular systolic function. LV Ejection Fraction  is 25 %. Normal geometry. Paradoxical septal motion consistent with RV pacemaker.  Resting Segmental Wall Motion Analysis: Total wall motion score is 2.24. There is akinesis of the mid to apical  anterolateral wall. There is akinesis of the apical cap. There is akinesis of the apical inferoseptal wall. The remaining  left ventricular segments demonstrate hypokinesis.  Right Ventricle: Mildly dilated right ventricle. Mildly depressed right ventricular systolic function. Echo density in  right ventricle suggestive of catheter, pacer lead, or ICD lead.  Left Atrium: The left atrium is normal in size.  Right Atrium: Mildly dilated right atrium.  Atrial Septum: The interatrial septum is normal in appearance.  Mitral Valve: There is moderate secondary mitral regurgitation.  Aortic Valve: Normal aortic valve. No aortic regurgitation seen. Trileaflet aortic valve. No Aortic Valve Stenosis.  Tricuspid Valve: There is moderate tricuspid regurgitation.  Pulmonic Valve: Normal pulmonic valve.  Pericardium: Normal pericardium with no pericardial effusion.  Aorta: Normal aortic root.  IVC: The inferior vena cava is dilated.  Pulmonary Artery:  Normal pulmonary artery size.      Heart Failure Therapies:   ARN1/ACE-I/ARB: No: Why? hypotension  Beta Blocker: No: Why? hypotension  Aldosterone antagonist: No: Why? hypotension  Device: Yes   CRT-D    Family Communication:   Code Status:  Code Status Information     Code Status    Full  Code            Assessment/Recommendations:  Acute on Chronic Systolic CHF. Requiring Cardiogenic Shock: no / ischemic cardiomyopathy/ Mild right ventricular dysfunction / Clinically mildly fluid overloaded on examination/ ACC/AHA Stage D, NYHA functional classIII    Diagnosis   . Essential hypertension, benign   . Pure hypercholesterolemia   . Coronary atherosclerosis of native coronary artery   . Splenomegaly   . Postsurgical percutaneous transluminal coronary angioplasty status   . Cardiomyopathy (Notable Code)   . Dyspnea   . Long term current use of anticoagulant therapy   . Fatigue   . Shortness of breath   . Edema   . Chronic a-fib (Notable Code)   . Ventricular tachycardia seen on cardiac monitor (Notable Code)   . Acute on chronic systolic heart failure (Notable Code)   . Morbid obesity with BMI of 40.0-44.9, adult (Notable Code)   . CABG x 4 --LIMA->D,LIMA->LAD, SVG->OM, RIMA->PDA on 06/15/14 by Dr.Navid   . AICD (automatic cardioverter/defibrillator) present   . Sleep apnea   . Bacteremia   . Osteomyelitis of ankle (Notable Code)   . Pulmonary HTN (Notable Code)   . Decubitus ulcer of right ankle, stage 4 (Notable Code)   . Primary cardiomyopathy (Notable Code)   . Type 2 diabetes mellitus without complication (Notable Code)   . Encounter for long-term (current) use of medications   . CHF exacerbation (Notable Code)   . Screening for thyroid disorder   . Screening, anemia, deficiency, iron   . Cardiac LV ejection fraction 15% as shown on Echocardiogram of 09-08-2015    Urosepsis  Supratheraputic INR  AKI over CKD stage III  AF on warfarin  DM type II    -  Pt is on Lasix 80 mg TID IV dose and as his status is getting  better, we will suggest to transition from lasix to torsemide 50 mg BID and we will monitor his diuresis here on PO medicaiton. He had UOP for 24 hrs for 2800 ml, negative 2260 ml and negative 16L since admission here.  - Please do Strict I/O, daily weight, fluid restriction and 2 g sodium diet.  - Recommended Lymphedema consult here if not done yet.  - Please do icd ([employer]) interrogation here if not done yet.  - Currently on Entresto 40 -51 mg BID home dose.  - Pt will benefits from CPAP at night for his OSA and will benefit from OSA eval as outpt.  - Also pt will benefits from SGLT2 I medication here for DM and CHF management  - Pt is on Bumex 2mg  BID, Entresto 49-51 mg BID and Coreg 12.5 mg BID at home.  - Recommended to restart the home BB as outpt setting with recent HF event.  - Pt will need outpt f/u from his cardiologist at [facility] within 1-2 weeks.  Please place consult order in [place] if not done yet.    Thank you for allowing [place] to participate in the care of your patient.  If you have any questions please do not hesitate to call.     Holley Bouche, MD      11/20/2017  I saw and examined the patient.  I reviewed the fellow's note.  I agree with the findings and plan of care as documented in the fellow's note.  Any exceptions/additions are edited/noted.    Noralee Stain, DO

## 2017-11-20 NOTE — OT Evaluation (Signed)
[facility]  Rehabilitation Services  Lymphedema Therapy Initial Evaluation      Patient Name: Andrew Arias  Date of Birth: 03-26-1956  Height:  Height: 193 cm (6' 3.98")  Weight:  Weight: (!) 149.9 kg (330 lb 7.5 oz)  Room/Bed: 805/A       Date/Time of Admission: 11/11/2017  7:20 AM  Admitting Diagnosis: UTI (urinary tract infection) [N39.0]      HPI:   Andrew Arias is a 62 y.o. malewith CAD s/p CABG, CHF, DM2, A-fib s/p ablation on warfarin, paced with AICD, CKD III, and BPHwho presents withseptic shock likely from a UTI/cystitis. Pt is oriented but lethargic and a poor historian, so the majority of hx is obtained from records review as pt was transferred from [place] presented to [facility] yesterday d/t AMS, fatigue, and anorexia with decreased PO intake for 2-3 days prior. Pt cannot recall events of going to the hospital or transfer here. Denies hx of recent dysuria or polyuria, F/C. ROS notable only for SOB, but pt states this is unchanged from baseline. Appeared to have urinary retention with 300 cc on bladder scan and straight cath was difficult to pass, producing minimal prurulent fluid. 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 CT head for AMS was unremarkable. Pt received one dose each of vancomycin and zosyn at the OSH and 2L fluid bolus. He began having further confusion and was hypotensive requiring levophed support. ECG at OSH also notable for possible pacemaker failure, but the pt had no cardiac symptoms. He was transferred on 100 ml/hr mIVF but no total given was reported; central line placed prior to transfer.     Precautions: fall, moderate arterial disease, open wounds, reduced ejection fraction         Past Medical History:   Diagnosis Date   . A-fib (CMS HCC)     s/p ablation   . Anemia    . Anticoagulant long-term use     Warfarin   . Arthritis    . CAD (coronary  artery disease)     s/p CABG   . Cardiac LV ejection fraction 21-30%    . Cardiomyopathy (CMS [place])     EF 25%   . Cellulitis of lower extremity    . CHF (congestive heart failure) (CMS HCC)    . CKD (chronic kidney disease), stage III (CMS HCC)    . Colonic polyp    . Constipation    . DM2 (diabetes mellitus, type 2) (CMS HCC)    . Edema     bilateral lower extremity   . Gout    . History of cellulitis    . HLD (hyperlipidemia)    . HTN (hypertension)    . Morbid obesity (CMS [place])    . Neuropathy (CMS HCC)     bilateral feet   . Osteomyelitis (CMS [place]) 2015    right ankle and was treated with external fixator for 4 months followed by multiple debridements   . Pacemaker    . Urinary retention    . Use of cane as ambulatory aid     states due to his right ankle- also has a walker and wheelchair               Past Surgical History:   Procedure Laterality Date   . HX CARDIAC ABLATION     . HX COLONOSCOPY     . HX CORONARY ARTERY  BYPASS GRAFT     . HX PACEMAKER DEFIBRILLATOR PLACEMENT           Social History          Tobacco Use   Smoking Status Former Smoker   . Types: Cigarettes   . Last attempt to quit: 2012   . Years since quitting: 7.2   Smokeless Tobacco Never Used       Subjective:   "You can try I guess."      Objective:   Pt seen at b/s for lymphedema therapy eval and intervention.  Pt known to me from inpt eval 3/21 at which time I elected not to treat him based on pt's goals and risk factors.  Pt OOB to chair with LEs elevated and R LE held in external rotation at the hip and flexion at the knee.  Large amount of lymph drainage to R calf gauze covering ABD covering a single Melgisorb Ag 4x4.    Pt with high volume, firm quality swelling in the R calf and softer, lower volume swelling in the L.  Discoloration to distal LEs consistent with chronic vascular disease.    Pain: mild tenderness to the R calf with skin and wound care and repositioning    Taken from my recent  inpt eval (*unless indicated otherwise):  Hx of edema/lymphedema: Pt indicates both legs were at their usual baseline which is how the L LE is today (which he does not feel is very swollen) until just prior to admission when the pt developed cellulitis  Hx prior edema/lymphedema management: none- pt reports he was prescribed a lymphedema pump that he paid $3,000 for 3 years ago and never used because he didn't really think he needed it.  He said that he cannot get compression stockings on.    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  Also a R lateral malleolus wound noted on wound care consult.  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  Pt also with a large, ruptured "blister" on the L medial proximal foot and the R lateral proximal foot with moderate drainage. Pt reports these just started yesterday. *Pt R great toe nail is nearly completely dislodged and attached only at a portion of the base and there is drainage present here as well.    Sensation: absent  Admission weight:  359 lb    Current weight: 330 lb  Circumferential measurements taken to monitor progress:  *swelling does not appear improved from 3/21  R foot 31cm, Y 42.5cm, ankle 34cm, widest calf 54.5cm  Imaging and labs reviewed with pertinent findings including: *renal now wfls, alb 2.6, HgbA1C 8.2, pulse volume recordings show moderate arterial occlusive disease B LEs and toe pressures < 50 mmHg indicative of poor wound healing potential.  Other: (+) Stemmer sign, skin changes including hyperkeratosis, wounds, fibrosis consistent with late stage lymphedema.       Education:  instructed pt in plan to apply light, multilayer compression today and check pt tomorrow am, instructed pt that if at any time he experiences pain attributed to the compression wrap that he is to have it removed and not  loosened.    Patient's Goal: not stated  Treatment today: R LE wiped with 2% chlorhexadine wipes, gentle wound care with wound cleanser and gauze 4x4s, applied Mepilex Ag foam 8x8 + 1/3 of a 2nd 8x8 to open area on the shin and medial calf and replaced the Mepilex heel border dressing.  Applied very light, multilayer compression using 4" cotton stockinet, Rosidal Soft foam padding from base of toes to just below the knee, 1-4" ace wrap spiral wrapped from based of toes to just above the heel with light tension and 1-6" x 10 yds Tetra Ace wrap (herringbone) from ankle to just below the knee with light tension.      Assessment:   Pt with longstanding, untreated lymphedema to B distal LEs R > L with complicating factors including poorly controlled DM, reduced EF, moderate arterial occlusive disease with toe pressures < 60mmHg indicative of poor wound healing potential, low albumin, and absent sensation.  Appreciate input from wound consult notes cautioning use of compression due to reduced EF, however, this is the 2nd time I have been consulted in a week and support/desire for lymphedema therapy is indicated in the cardiology notes.  I will cautiously trial light compression, and I believe he will have good reduction in volume in the R distal LE, however, I am concerned noting the pt now has several more open areas on the R foot which previously were closed blisters and great toe (nail).  Will wrap for today and follow up tomorrow with further thoughts once I see the pt's response.    *Pt/RN may remove compression if poorly tolerated for any reason, or if soiled.  If compression would need to be removed for any reason- please page me to reapply. If I am not available to reapply compression, please  leave compression off (Do not reapply) and ensure wounds (if any) are covered. Compression re-applied incorrectly may hinder the pt's progress and could result in worsening of the pt's condition.    Goals:   1. Pt will demonstrate reduction in circumferential measurements of lymphedema/edema by 3-5cm to facilitate improved venous return, tissue health, mobility and adl performance by d/c.  2. Pt will tolerate light compression on at all times, removed for bathing to support optimal lymphedema/edema reduction/stabilization, venous return, and skin integrity to facilitate transition to appropriate compression garment for long term use by d/c.   3. Pt and/or family will demonstrate knowledge of lymphedema/edema mgmt program including cause(s) of lymphedema/edema, lifestyle modification needs/ modifiable risk factors, current treatments in place to manage lymphedema/edema, and the need to continue lymphedema/edema management long term by d/c.      Discharge Needs:   tbd based on pt's response to light compression    Plan:   Will follow for lymphedema treatment 2-5x/week during hospital stay to facilitate edema reduction, optimal skin integrity and pt comfort.      Compression wrap(s) should remain on at all times, but should be removed if they become soiled, wet, slide downward, or are poorly tolerated by the pt for any reason.  Will re-check Wed am.    *Pt/RN may remove compression if poorly tolerated for any reason, or if soiled.  If compression would need to be removed for any reason- please page me to reapply. If I am not available to reapply compression, please leave compression off and ensure wounds (if any) are covered. Compression re-applied incorrectly may hinder the pt's progress and could result in worsening of the pt's condition.  The risks/benefits of therapy have been discussed with the patient/caregiver and he/she is in agreement with the established plan of care.     Therapist:   Gordy Clement,  OTR/L, CLT 11/20/2017 11:28  Pager: #[phone]  Evaluation Time: 30 minutes; Treatment time: 15 minutes  Eval complexity: high  Time may include review of medical chart, obtaining patient's functional history from patient/family/medical staff/case management/ancillary personnel, collaboration on findings and treatment options (with the above mentioned individuals), re-assessment, and acute care rehabilitation.

## 2017-11-20 NOTE — Care Plan (Signed)
Pt calm and cooperative, irritated at times wanting to eat, sleep, and drink "normally", A/Ox 4, per baseline. Uses call bell to effectively make needs known. Pain managed more efficiently since stronger pain meds were ordered and given. Up in the chair most of the shift and tolerating well. Wound dressings changed by wound PT/OT. Hourly rounding completed per RN and CA, to make sure pt's needs are effectively met. VS and assessments documented per flowsheets. Plan of care discussed with pt. No verbalized questions at this time. Will continue to monitor   Problem: Adult Inpatient Plan of Care  Goal: Patient-Specific Goal (Individualization)  Outcome: Ongoing (see interventions/notes)  Flowsheets (Taken 11/20/2017 1554)  Individualized Care Needs: Pt wants to get out of here and be able to eat, drink, and sleep better

## 2017-11-20 NOTE — Care Plan (Signed)
Lymphedema Therapy      Assessment:   Pt with longstanding, untreated lymphedema to B distal LEs R > L with complicating factors including poorly controlled DM, reduced EF, moderate arterial occlusive disease with toe pressures < 91mmHg indicative of poor wound healing potential, low albumin, and absent sensation.  Appreciate input from wound consult notes cautioning use of compression due to reduced EF, however, this is the 2nd time I have been consulted in a week and support/desire for lymphedema therapy is indicated in the cardiology notes.  I will cautiously trial light compression, and I believe he will have good reduction in volume in the R distal LE, however, I am concerned noting the pt now has several more open areas on the R foot which previously were closed blisters and great toe (nail).  Will wrap for today and follow up tomorrow with further thoughts once I see the pt's response.    *Pt/RN may remove compression if poorly tolerated for any reason, or if soiled.  If compression would need to be removed for any reason- please page me to reapply.  If I am not available to reapply compression, please leave compression off (Do not reapply) and ensure wounds (if any) are covered.  Compression re-applied incorrectly may hinder the pt's progress and could result in worsening of the pt's condition.    Goals:   1. Pt will demonstrate reduction in circumferential measurements of lymphedema/edema by 3-5cm to facilitate improved venous return, tissue health, mobility and adl performance by d/c.  2. Pt will tolerate light compression on at all times, removed for bathing to support optimal lymphedema/edema reduction/stabilization, venous return, and skin integrity to facilitate transition to appropriate compression garment for long term use by d/c.   3. Pt and/or family will demonstrate knowledge of lymphedema/edema mgmt program including cause(s) of lymphedema/edema, lifestyle modification needs/ modifiable risk  factors, current treatments in place to manage lymphedema/edema, and the need to continue lymphedema/edema management long term by d/c.      Discharge Needs:   tbd based on pt's response to light compression    Plan:   Will follow for lymphedema treatment 2-5x/week during hospital stay to facilitate edema reduction, optimal skin integrity and pt comfort.      Compression wrap(s) should remain on at all times, but should be removed if they become soiled, wet, slide downward, or are poorly tolerated by the pt for any reason.  Will re-check Wed am.    *Pt/RN may remove compression if poorly tolerated for any reason, or if soiled.  If compression would need to be removed for any reason- please page me to reapply.  If I am not available to reapply compression, please leave compression off and ensure wounds (if any) are covered.  Compression re-applied incorrectly may hinder the pt's progress and could result in worsening of the pt's condition.    The risks/benefits of therapy have been discussed with the patient/caregiver and he/she is in agreement with the established plan of care.     Therapist:   Gordy Clement, OTR/L, CLT 11/20/2017 11:28  Pager: #[phone]

## 2017-11-20 NOTE — Care Plan (Signed)
Plan of care reviewed with patient. Patient admitted with septic shock/UTI and hypokalemia. Maintain fall and skin precautions, bed alarm on for safety. Dressing change to right leg BID. Hercules bed. PRN Acetaminophen for complaints of back pain. Continue IV Lasix. Continue 2000cc fluid restriction. D/C plan to SNF. Will continue to monitor labs, vitals, I&O.     Problem: Adult Inpatient Plan of Care  Goal: Plan of Care Review  Outcome: Ongoing (see interventions/notes)  Goal: Patient-Specific Goal (Individualization)  Outcome: Ongoing (see interventions/notes)  Goal: Absence of Hospital-Acquired Illness or Injury  Outcome: Ongoing (see interventions/notes)  Goal: Optimal Comfort and Wellbeing  Outcome: Ongoing (see interventions/notes)     Problem: Fall Injury Risk  Goal: Absence of Fall and Fall-Related Injury  Outcome: Ongoing (see interventions/notes)     Problem: Skin Injury Risk Increased  Goal: Skin Health and Integrity  Outcome: Ongoing (see interventions/notes)

## 2017-11-21 LAB — CBC WITH DIFF
BASOPHIL #: 0.07 x10ˆ3/uL (ref 0.00–0.20)
BASOPHIL %: 1 %
EOSINOPHIL #: 0.11 x10ˆ3/uL (ref 0.00–0.50)
EOSINOPHIL %: 1 %
HCT: 33.4 % — ABNORMAL LOW (ref 36.7–47.0)
HGB: 11.2 g/dL — ABNORMAL LOW (ref 12.5–16.3)
LYMPHOCYTE #: 0.67 x10ˆ3/uL — ABNORMAL LOW (ref 1.00–4.80)
LYMPHOCYTE %: 7 %
MCH: 31.9 pg (ref 27.4–33.0)
MCHC: 33.5 g/dL (ref 32.5–35.8)
MCV: 95.4 fL (ref 78.0–100.0)
MONOCYTE #: 1.23 x10ˆ3/uL — ABNORMAL HIGH (ref 0.30–1.00)
MONOCYTE %: 14 %
MPV: 7 fL — ABNORMAL LOW (ref 7.5–11.5)
NEUTROPHIL #: 7 x10ˆ3/uL (ref 1.50–7.70)
NEUTROPHIL %: 77 %
NEUTROPHIL %: 77 %
PLATELETS: 242 x10ˆ3/uL (ref 140–450)
RBC: 3.5 x10ˆ6/uL — ABNORMAL LOW (ref 4.06–5.63)
RDW: 18.1 % — ABNORMAL HIGH (ref 12.0–15.0)
WBC: 9.1 x10ˆ3/uL (ref 3.5–11.0)

## 2017-11-21 LAB — BASIC METABOLIC PANEL
ANION GAP: 11 mmol/L (ref 4–13)
BUN/CREA RATIO: 28 — ABNORMAL HIGH (ref 6–22)
BUN: 29 mg/dL — ABNORMAL HIGH (ref 8–25)
CALCIUM: 8.6 mg/dL (ref 8.5–10.2)
CHLORIDE: 98 mmol/L (ref 96–111)
CO2 TOTAL: 30 mmol/L (ref 22–32)
CREATININE: 1.04 mg/dL (ref 0.62–1.27)
ESTIMATED GFR: >59 mL/min/1.73mˆ2 (ref >59)
GLUCOSE: 107 mg/dL (ref 65–139)
POTASSIUM: 4.4 mmol/L (ref 3.5–5.1)
SODIUM: 139 mmol/L (ref 136–145)

## 2017-11-21 LAB — PHOSPHORUS: PHOSPHORUS: 2.7 mg/dL (ref 2.3–4.0)

## 2017-11-21 LAB — PT/INR
INR: 2.74 — ABNORMAL HIGH (ref 0.80–1.20)
PROTHROMBIN TIME: 31.4 seconds — ABNORMAL HIGH (ref 9.5–14.1)

## 2017-11-21 LAB — POC BLOOD GLUCOSE (RESULTS)
GLUCOSE, POC: 135 mg/dl — ABNORMAL HIGH (ref 70–105)
GLUCOSE, POC: 162 mg/dl — ABNORMAL HIGH (ref 70–105)

## 2017-11-21 MED ORDER — MAGNESIUM SULFATE 2 GRAM/50 ML (4 %) IN WATER INTRAVENOUS PIGGYBACK
2.00 g | INJECTION | INTRAVENOUS | Status: AC
Start: 2017-11-21 — End: 2017-11-21
  Administered 2017-11-21: 0 g via INTRAVENOUS
  Administered 2017-11-21: 2 g via INTRAVENOUS
  Filled 2017-11-21: qty 50

## 2017-11-21 MED ORDER — WARFARIN 1 MG TABLET
1.00 mg | ORAL_TABLET | Freq: Every evening | ORAL | Status: AC
Start: 2017-11-21 — End: 2017-11-21
  Administered 2017-11-21: 1 mg via ORAL
  Filled 2017-11-21: qty 1

## 2017-11-21 MED ADMIN — potassium chloride 20 mEq/L in D5-0.9 % sodium chloride intravenous: ORAL | @ 13:00:00 | NDC 00338080304

## 2017-11-21 NOTE — OT Treatment (Addendum)
[facility]  Rehabilitation Services  Lymphedema Therapy Progress Note      Patient Name: Andrew Arias  Date of Birth: [birth year]  Height:  Height: 193 cm (6' 3.98")  Weight:  Weight: (!) 149.6 kg (329 lb 12.9 oz)  Room/Bed: 805/A       Date/Time of Admission: 11/11/2017  7:20 AM  Admitting Diagnosis: UTI (urinary tract infection) [N39.0]    Subjective:   "Someone from wound care came in and took everything off this morning to look at my leg"      Objective:   Pt seen at b/s this am.  Pt OOB to chair with R LE propped on another chair with all of the weight on the LE being supported on the R lateral posterior foot.  No compression in place.  Wound dressings on the R shin and calf had been changed and covered with kerlix and then covered with the lymphatic drainage soiled piece of stockinet that had been used over the dressings I placed yesterday.  Wound dressing the heel had not been changed and there was no dressing to the R lateral malleolus.  Pt reports the dressing had become very loose.  Discussed with RN to clarify who had seen the pt this am as there are not yet any notes in the chart.  RN indicated someone from wound care had seen the pt, stated that the wrap was too tight, and stated that the pt should never be wrapped due to diminished heart function.  Pain: some pain when the R LE is lifted to visualize wound on the R lateral ankle and R posterior lateral foot    Wound status: very heavy drainage from the R shin/calf noting the amount of strike through on the stockinet.  This area was re-dressed this morning and it appears it was dressed with Xeroform and Kerlix.  I am not sure if this is a temporary dressing or now the dressing of choice vs what was ordered 3/20.  R lateral malleolus wound was open to air.  Again, not sure if this is desired or if pt will continue according to orders from 3/21.  Wounds on the R medial posterior heel and lateral posterior heel were not addressed by  wound care during the visit this am.  I removed the Mepilex border dressing and replaced due to heavy lymph drainage particularly from the medial posterior foot wound.  Admission weight: see eval     Current weight: Weight: (!) 149.6 kg (329 lb 12.9 oz)    Circumferential measurements taken to monitor progress:  Left deferred  Right foot 28.8cm, Y=39.5cm, ankle 31.8cm, widest calf 52cm    Education: instructed pt in my plan to respectfully sign off due to conflicting opinions regarding the best approach to treating the swelling and wounds to the R LE given his medical complexity.  Discussed case briefly with RN and asked that she replace the R lateral malleolus dressing with what is ordered unless wound care notes from this morning's visit when available suggest otherwise.  I also informed her that the foot dressing was replaced and that this also will need to be replaced daily.    Treatment today: wound care to R lateral posterior and medial posterior foot wounds and covered with mepilex border dressing.      Addendum 12:43pm:  Discussed case with Med 1 via telephone and reviewed wound care providers note which was filed at 1117.  Wound care no longer wishes to follow the  pt and lymphedema therapy is still desired by Medicine 1 service and Cardiology. Requested Med 1 re-order lymphedema therapy noting I had d/c'd the previous order.      2nd treatment today:  Removed lymph soaked Kerlix and reinforced Xeroform placed earlier with Melgisorb Ag 4x4s and covered with mepilex Ag foam for additional support of drainage from shin/calf.  Replaced L medial posterior and lateral posterior foot dressing I had placed this morning with Mepilex Ag foam cut to appropriate size in each area.  Covered lateral malleolus with Santyl ointment and Mepilex Ag foam.  Re-applied light, multilayer compression to the R distal LE using 4" cotton stockinet, Rosidal Soft foam padding from base of toes to just below the knee, 1-4" ace wrap to  foot, ankle and heel with very light tension and 1-6" x 10 yds Tetra Ace wrap (herringbone) from ankle to just below the knee with very light tension.  Discussed pt's positioning of preference and informed him that his chance of heeling the wounds on the lateral malleolus and R lateral posterior foot are significantly diminished because of the pressure placed on these areas in his position of choice.  Pt receptive to try other options.  I further discussed the risks of compression with the pt with emphasis on cardiac function, bu that the cardiology notes are supportive of lymphedema therapy.  Discussed risks related to insensate distal LE and what precautions I was using, but that I could not guarantee that the pt would not develop another wound(s) related to compression and the pt wishes to continue this treatment with me seeing him daily for several days in a row if he remains hospitalized.    Assessment:   Good reduction in volume to the R distal LE following < 1 day of light compression, However, plan to respectfully sign off due to conflicting opinions regarding the best approach to treating the swelling and wounds to the R LE given his medical complexity. Please defer all concerns with the R LE to wound care and ensure pt has follow up with their team upon d/c.      Addendum 12:43 pm:  Will continue to follow the pt daily through Friday and M-F thereafter if he remains an inpt.  Wound care no longer following the pt.      Discharge Needs:   Please continue to follow all written wound care orders and defer all care for the R LE to wound care.  Please ensure pt has follow up with their team upon d/c.  No further lymphedema therapy planned.      Addendum 12:43 pm  Pt reports he is being d/c'd home with [facility] home health.  Please assist pt in making a follow up appt at [facility] (he is established there) within 1 week of d/c.  Pt most likely will not be able to follow with lymphedema therapy upon  d/c while receiving home health.      Plan:   Will continue to follow established plan of care.    Addendum 12:43 pm  Will continue to follow daily through Friday and M-F thereafter while inpt.    The risks/benefits of therapy have been discussed with the patient/caregiver and he/she is in agreement with the established plan of care.     Therapist:   Gordy Clement, OTR/L, CLT  11/21/2017 09:13  Pager #:[phone]  Treatment Time: 16 minutes; 2nd treatment visit time: 25 minutes  Time may include review of medical chart, obtaining patient's functional  history from patient/family/medical staff/case management/ancillary personnel, collaboration on findings and treatment options (with the above mentioned individuals), re-assessment, and acute care rehabilitation.

## 2017-11-21 NOTE — Ancillary Notes (Signed)
[facility] Medicine   Transitional Care Coordination   Initial Assessment       Name: Andrew Arias   Age: 62 y.o.  Date of Birth: [birth year]   Date of service: 11/21/2017  Date of Admission:  11/11/2017  Hospital Day:  LOS: 10 days        1. Patient appointment preferences: None  2. Transportation to healthcare appointments: Drives - no concerns noted  3. PCP listed as No Pcp. This is incorrect per patient, who states he is established with Dr. Lieutenant Diego in [place]. Obtained verbal consent from [place] to contact PCP.   4. Preferred method of contact: Home   Confirmed patient contact information:   Home Phone [phone]   Work Phone [phone]   Mobile [phone]       Met with Patient to discuss provider of primary care services.  Discussed role of Transitional Care Coordination Team and informed of contact within 2 business days of discharge to assess follow-up plan.    Per service, patient will also need cardiology follow up. Patient states he is established with Dr. Jerral Bonito in [place] for cardiology.    [address], [place], 11/21/2017  Ext. [phone]

## 2017-11-21 NOTE — Care Plan (Signed)
[facility]  Occupational Therapy Progress Note    Patient Name: Andrew Arias  Date of Birth: 09-20-1955  Height:  193 cm (6' 3.98")  Weight:  (!) 149.6 kg (329 lb 12.9 oz)  Room/Bed: 805/A  Payor: [facility] [employer] / Plan: [place] [facility] / Product Type: [facility] /     Assessment:    Pt tolerated OT treatment well. Pt demonstrated increased (I) with ADL transfers including STS & commode transfer with use of FWW, fxnl mobility, standing grooming, & LB dressing. Pt has equipment needs for d/c home & 24/7 assistance. Will continue to follow pt. Would recommend home with 24/7 assist & [facility] OT when pt medically appropriate for d/c.      Discharge Needs:   Equipment Recommendation: to be determined    Discharge Disposition:  home with 24/7 assistance, home with home health   The above recommendation is based upon the current examination and evaluation performed on this date. As subsequent sessions are completed, recommendations will be updated accordingly.    JUSTIFICATION OF DISCHARGE RECOMMENDATION   Based on current diagnosis, functional performance prior to admission, and current functional performance, this patient requires continued OT services in home with 24/7 assistance, home with home health in order to achieve significant functional improvements.    Plan:   Continue to follow patient according to established plan of care.  The risks/benefits of therapy have been discussed with the patient/caregiver and he/she is in agreement with the established plan of care.     Subjective & Objective:        11/21/17 1034   Therapist Pager   OT Assigned/ Pager # Dietrich Samuelson [phone]   Rehab Session   Document Type therapy progress note (daily note)   Total OT Minutes: 23   Patient Effort good   Symptoms Noted During/After Treatment increased pain   General Information   Patient Profile Reviewed? yes   Medical Lines Telemetry;PIV Line;Foley Catheter   Respiratory Status room air   Existing  Precautions/Restrictions fall precautions;full code   Pre Treatment Status   Pre Treatment Patient Status Patient sitting in bedside chair or w/c;Call light within reach;Telephone within reach;Sitter select activated;Nurse approved session   Support Present Pre Treatment  Nurse present   Communication Pre Treatment  Nurse   Communication Pre Treatment Comment Pt/RN agreeable to OT consult, seen with professional assist of PT   Mutuality/Individual Preferences   Individualized Care Needs OOB A x2 with FWW   Self-Care   Current Activity Tolerance moderate   Pain Assessment   Pre/Post Treatment Pain Comment c/o back pain with fxnl mobility, did not formally rate    Coping/Psychosocial Response Interventions   Plan Of Care Reviewed With patient   Cognitive Assessment/Interventions   Behavior/Mood Observations behavior appropriate to situation, WNL/WFL;alert;cooperative   Orientation Status oriented x 4   Attention WNL/WFL   Follows Commands WNL   Mobility Assessment/Training   Mobility Comment fxnl mobility household distances with FWW    Transfer Assessment/Treatment   Sit-Stand Independence contact guard assist   Stand-Sit Independence contact guard assist   Sit-Stand-Sit, Assist Device walker, front wheeled   Toilet Transfer Independence contact guard assist   Toilet Transfer Assist Device walker, front wheeled   Lower Body Dressing Assessment/Training   Comment Dep A to donn L sock   Toileting Assessment/Training   Comment BSC transfer use of FWW CGA   Grooming Assessment/Training   Position standing   Independence Level contact guard  assist   Comment washed hands standing at sink   Balance Skill Training   Comment use of FWW   Sitting Balance: Static fair + balance   Sit-to-Stand Balance fair balance   Standing Balance: Static fair + balance   Standing Balance: Dynamic fair balance   Post Treatment Status   Post Treatment Patient Status Patient sitting in bedside chair or w/c;Call light within reach;Telephone  within reach;Sitter select activated   Support Present Post Treatment  None   Communication Post Treatement Care Management;Nurse   Occupational Therapy Clinical Impression   Functional Level at Time of Session Pt tolerated OT treatment well. Pt demonstrated increased (I) with ADL transfers including STS & commode transfer with use of FWW, fxnl mobility, standing grooming, & LB dressing. Pt has equipment needs for d/c home & 24/7 assistance. Will continue to follow pt. Would recommend home with 24/7 assist & [facility] OT when pt medically appropriate for d/c.   Anticipated Equipment Needs at Discharge to be determined   Anticipated Discharge Disposition home with 24/7 assistance;home with home health       Therapist:   Sabino Snipes, MOT, OTR/L  Pager #: [phone]

## 2017-11-21 NOTE — Care Plan (Signed)
Lymphedema Therapy      Assessment:   Good reduction in volume to the R distal LE following < 1 day of light compression, However, plan to respectfully sign off due to conflicting opinions regarding the best approach to treating the swelling and wounds to the R LE given his medical complexity. Please defer all concerns with the R LE to wound care and ensure pt has follow up with their team upon d/c.        Discharge Needs:   Please continue to follow all written wound care orders and defer all care for the R LE to wound care.  Please ensure pt has follow up with their team upon d/c.  No further lymphedema therapy planned.      Plan:   Respectfully signing off due to conflicting opinions regarding this pts care.    re-applied incorrectly may hinder the pt's progress and could result in worsening of the pt's condition.    The risks/benefits of therapy have been discussed with the patient/caregiver and he/she is in agreement with the established plan of care.     Therapist:   Gordy Clement, OTR/L, CLT  11/21/2017 09:13  Pager #:[phone]

## 2017-11-21 NOTE — Pharmacy (Addendum)
[facility] / Department of Pharmaceutical Services  Therapeutic Drug Monitoring: Warfarin Progress Note    Andrew Arias is a 62 y.o. year old male on warfarin for afib     Goal INR: 2-3    Home dose: 5 mg nightly per patient    Outpatient INR Management: Unknown cardiologist in [place], [place] (will need to follow up)    Inpatient Management: Medicine 1    Concurrent Antiplatelet Agents: aspirin    Inpatient Warfarin Dosing:     Date INR Warfarin Dose   (@ 21:00) Bridging Regimen Drug Interactions Comments   03/17 2.23 5 mg      03/18 2.96 Held      03/19 3.78 Held  leflunomide may increase INR    03/20 4.13 Held      03/21 4.03 Held      03/22 4.16 Held      03/23 3.85 Held      03/24 3.75 Held      03/25 3.38 Held      03/26 2.99 Held      03/27 2.74 (1 mg)                Assessment/Plan:  Andrew Arias 's INR today is therapeutic    Pharmacist recommendation: Now that INR is therapeutic for the second straight day and INR still slowly downtrending, agree with team to consider restarting warfarin at much lower dose of 1 mg. Per patient cannot afford DOACs.      Monitoring:  Please continue to monitor PT/INR daily.  Monitor for signs/symptoms of bleeding.   Pharmacist will continue to make daily warfarin recommendations.   Please contact pharmacy with any questions.

## 2017-11-21 NOTE — Ancillary Notes (Signed)
[facility] Medicine   Transition of Care Coordination   PCP Verification        Name: Andrew Arias  Date of Birth: 02-05-1956 male  LOS: 10  Date/Time of Admission: 11/11/2017  7:20 AM   Service: MEDICINE 1      1.Contacted Dr. Arnette Norris office to verify establishment and determine barriers to follow-up. Dr. Janne Lab verified as patient's PCP. Last visit: 09/20/17.   2. Appointment availability within 7 days of discharge: Yes  3. PCP agreeable to follow warfarin/INR if applicable: Yes  4. PCP agreeable to follow home health if applicable: Yes  5. PCP office offers designated personnel for care coordination: YesAnderson Malta, RN  6. PCP contact information correct: Yes      Sharen Hint, [place], 11/21/2017  Ext. [phone]

## 2017-11-21 NOTE — Care Plan (Signed)
Pt calm and cooperative, but anxious for discharge. A/Ox 4 , per baseline. Pain adequately managed all shift with PRN medications and weight shift techniques. D/C planned for 03/28.  Uses call bell to effectively make needs known. Hourly rounding completed per RN and CA, to make sure pt's needs are effectively met. VS and assessments documented per flowsheets. Plan of care discussed with pt. No verbalized questions at this time. Will continue to monitor   Problem: Adult Inpatient Plan of Care  Goal: Patient-Specific Goal (Individualization)  Outcome: Ongoing (see interventions/notes)  Flowsheets (Taken 11/21/2017 1905)  Individualized Care Needs: Pt ready to get discharged and leave here

## 2017-11-21 NOTE — Care Plan (Signed)
[facility]  Physical Therapy Progress Note      Patient Name: Andrew Arias  Date of Birth: 1956-04-07  Height:  193 cm (6' 3.98")  Weight:  (!) 149.6 kg (329 lb 12.9 oz)  Room/Bed: 805/A  Payor: [facility] [employer] [facility] / Plan: [place] [facility] / Product Type: MEDICARE [facility] /     Assessment:     Patient was able to stand with CG. Ambulated 52 feet with WW and CG. Patient prefers to return home with assist and home health. Will have assist 24/7.    Discharge Needs:   Equipment Recommendation: none anticipated    Discharge Disposition: home with 24/7 assistance, home with home health    JUSTIFICATION OF DISCHARGE RECOMMENDATION   Based on current diagnosis, functional performance prior to admission, and current functional performance, this patient requires continued PT services in home with 24/7 assistance, home with home health in order to achieve significant functional improvements in these deficit areas: muscle performance, gait, locomotion, and balance.      Plan:   Continue to follow patient according to established plan of care.  The risks/benefits of therapy have been discussed with the patient/caregiver and he/she is in agreement with the established plan of care.     Subjective & Objective:        11/21/17 1033   Therapist Pager   PT Assigned/ Pager # [phone]   Rehab Session   Document Type therapy progress note (daily note)   Total PT Minutes: 23   Patient Effort good   Symptoms Noted During/After Treatment increased pain   General Information   Patient Profile Reviewed? yes   Medical Lines Telemetry;PIV Line;Foley Catheter   Respiratory Status room air   Mutuality/Individual Preferences   Individualized Care Needs OOB with assist x 2 with [employer].   Pre Treatment Status   Pre Treatment Patient Status Patient sitting in bedside chair or w/c;Call light within reach;Telephone within reach;Nurse approved session   Support Present Pre Treatment  Nurse present   Communication Pre Treatment   Nurse   Pain Assessment   Pre/Post Treatment Pain Comment C/O back pain with gait   Transfer Assessment/Treatment   Sit-Stand [place] contact guard assist   Stand-Sit Independence contact guard assist   Sit-Stand-Sit, Assist Device walker, front wheeled   Gait Assessment/Treatment   Independence  contact guard assist   Assistive Device  walker, front wheeled   Distance in Feet 52   Balance Skill Training   Standing Balance: Static fair + balance   Standing Balance: Dynamic fair balance   Post Treatment Status   Post Treatment Patient Status Patient sitting in bedside chair or w/c;Call light within reach;Telephone within reach   Support Present Post Treatment  None   Communication Post Treatement Nurse   Plan of Care Review   Plan Of Care Reviewed With patient   Physical Therapy Clinical Impression   Assessment Patient was able to stand with CG. Ambulated 52 feet with WW and CG. Patient prefers to return home with assist and home health. Will have assist 24/7.   Anticipated Equipment Needs at Discharge (PT) none anticipated   Anticipated Discharge Disposition home with 24/7 assistance;home with home health   Planned Therapy Interventions, PT Eval   Planned Therapy Interventions (PT) balance training;bed mobility training;gait training;transfer training       Therapist:   Carollee Herter, PT   Pager #: [phone]

## 2017-11-21 NOTE — Care Plan (Signed)
Lymphedema Therapy      Addendum 12:43pm:  Discussed case with Med 1 via telephone and reviewed wound care providers note which was filed at 1117.  Wound care no longer wishes to follow the pt and lymphedema therapy is still desired by Medicine 1 service and Cardiology. Requested Med 1 re-order lymphedema therapy noting I had d/c'd the previous order.      2nd treatment today:  Removed lymph soaked Kerlix and reinforced Xeroform placed earlier with Melgisorb Ag 4x4s and covered with mepilex Ag foam for additional support of drainage from shin/calf.  Replaced L medial posterior and lateral posterior foot dressing I had placed this morning with Mepilex Ag foam cut to appropriate size in each area.  Covered lateral malleolus with Santyl ointment and Mepilex Ag foam.  Re-applied light, multilayer compression to the R distal LE using 4" cotton stockinet, Rosidal Soft foam padding from base of toes to just below the knee, 1-4" ace wrap to foot, ankle and heel with very light tension and 1-6" x 10 yds Tetra Ace wrap (herringbone) from ankle to just below the knee with very light tension.  Discussed pt's positioning of preference and informed him that his chance of heeling the wounds on the lateral malleolus and R lateral posterior foot are significantly diminished because of the pressure placed on these areas in his position of choice.  Pt receptive to try other options.  I further discussed the risks of compression with the pt with emphasis on cardiac function, bu that the cardiology notes are supportive of lymphedema therapy.  Discussed risks related to insensate distal LE and what precautions I was using, but that I could not guarantee that the pt would not develop another wound(s) related to compression and the pt wishes to continue this treatment with me seeing him daily for several days in a row if he remains hospitalized.    Assessment:   Good reduction in volume to the R distal LE following < 1 day of light  compression, However, plan to respectfully sign off due to conflicting opinions regarding the best approach to treating the swelling and wounds to the R LE given his medical complexity. Please defer all concerns with the R LE to wound care and ensure pt has follow up with their team upon d/c.      Addendum 12:43 pm:  Will continue to follow the pt daily through Friday and M-F thereafter if he remains an inpt.  Wound care no longer following the pt.      Discharge Needs:   Please continue to follow all written wound care orders and defer all care for the R LE to wound care.  Please ensure pt has follow up with their team upon d/c.  No further lymphedema therapy planned.      Addendum 12:43 pm  Pt reports he is being d/c'd home with [facility] home health.  Please assist pt in making a follow up appt at [facility] (he is established there) within 1 week of d/c.  Pt most likely will not be able to follow with lymphedema therapy upon d/c while receiving home health.      Plan:   Will continue to follow established plan of care.    Addendum 12:43 pm  Will continue to follow daily through Friday and M-F thereafter while inpt.    The risks/benefits of therapy have been discussed with the patient/caregiver and he/she is in agreement with the established plan of care.  Therapist:   Gordy Clement, OTR/L, CLT  11/21/2017 09:13  Pager #:[phone]

## 2017-11-21 NOTE — Consults (Signed)
[facility]  HVI Advance Lower Extremity Wounds Consult  Follow Up    Andrew Arias, Andrew Arias, 62 y.o. male  Date of Service: 11/21/2017  Date of Birth:  Mar 19, 1956    Hospital Day:  LOS: 10 days     Chief Complaint:  Let lower extremity venous wound  Subjective: Patient sitting up in a chair leaning over another chair. He states he is having back pain and bladder spasms. He did not get much rest last night and would like to go home. He stated he now has lymph edema wrapping his leg and they were coming today.    Objective:  Temperature: 36.5 C (97.7 F)  Heart Rate: 74  BP (Non-Invasive): 90/66  Respiratory Rate: 16  SpO2: 90 %  Pain Score (Numeric, Faces): 7  Constitutional:  appears chronically ill, morbidly obese, appears stated age, mild distress and vital signs reviewed  Eyes:  Conjunctiva clear.  ENT:  Nose without erythema. , Mouth mucous membranes moist. , face symmetrical  Neck:  supple, symmetrical, trachea midline  Respiratory:  respirations are regular and non labored on room air  Cardiovascular:  regular rate and rhythm     bilateral lower extremtiy edema right greater than the left - pitting edema on the left , no able to palpate dorsalis pedis due to swelling  Gastrointestinal:  Soft, non-tender, non-distended  Genitourinary:  indwelling foley catheter with orange colored urine  Musculoskeletal:  Head atraumatic and normocephalic and AROM  Integumentary:  Skin warm and dry and right mallelous wound open with yellow exudate, no creptious, no necrosis and no fluctuance, right anterior shin with open wound and skin peeling, the wound has yellow exudate, no creptious, no necrosis and no fluctuance.  Neurologic:  Alert and oriented x3  Psychiatric:  Affect Depressed     Wound Assessment             Wound #1  Type: Vascular ulcer vs traumatic skin tear due to edema  Location: Anterior, Lower and Right leg  Length: 8 cm  Width: 6 cm  Depth: 01 cm  Undermining/Tunneling: none noted on this exam  Wound Base  %: 40% fibrinous tissue and slough on the lateral portion of the wound  Wound Edges: smooth  Drainage amt: Moderate  Serous drainage with odor Absent  Peri-wd Skin: erythema noted, no crepitous, no necrosis and no fluctuance.    Labs:    Lab Results Today:    Results for orders placed or performed during the hospital encounter of 11/11/17 (from the past 24 hour(s))   POC BLOOD GLUCOSE (RESULTS)   Result Value Ref Range    GLUCOSE, POC 161 (H) 70 - 105 mg/dl   POC BLOOD GLUCOSE (RESULTS)   Result Value Ref Range    GLUCOSE, POC 147 (H) 70 - 105 mg/dl   POC BLOOD GLUCOSE (RESULTS)   Result Value Ref Range    GLUCOSE, POC 104 70 - 105 mg/dl   BASIC METABOLIC PANEL   Result Value Ref Range    SODIUM 139 136 - 145 mmol/L    POTASSIUM 4.4 3.5 - 5.1 mmol/L    CHLORIDE 98 96 - 111 mmol/L    CO2 TOTAL 30 22 - 32 mmol/L    ANION GAP 11 4 - 13 mmol/L    CALCIUM 8.6 8.5 - 10.2 mg/dL    GLUCOSE 107 65 - 139 mg/dL    BUN 29 (H) 8 - 25 mg/dL    CREATININE 1.04 0.62 - 1.27 mg/dL  BUN/CREA RATIO 28 (H) 6 - 22    ESTIMATED GFR >59 >59 mL/min/1.51m^2   MAGNESIUM   Result Value Ref Range    MAGNESIUM 1.9 1.6 - 2.5 mg/dL   PHOSPHORUS   Result Value Ref Range    PHOSPHORUS 2.7 2.3 - 4.0 mg/dL   PT/INR   Result Value Ref Range    PROTHROMBIN TIME 31.4 (H) 9.5 - 14.1 seconds    INR 2.74 (H) 0.80 - 1.20   CBC WITH DIFF   Result Value Ref Range    WBC 9.1 3.5 - 11.0 x10^3/uL    RBC 3.50 (L) 4.06 - 5.63 x10^6/uL    HGB 11.2 (L) 12.5 - 16.3 g/dL    HCT 33.4 (L) 36.7 - 47.0 %    MCV 95.4 78.0 - 100.0 fL    MCH 31.9 27.4 - 33.0 pg    MCHC 33.5 32.5 - 35.8 g/dL    RDW 18.1 (H) 12.0 - 15.0 %    PLATELETS 242 140 - 450 x10^3/uL    MPV 7.0 (L) 7.5 - 11.5 fL    NEUTROPHIL % 77 %    LYMPHOCYTE % 7 %    MONOCYTE % 14 %    EOSINOPHIL % 1 %    BASOPHIL % 1 %    NEUTROPHIL # 7.00 1.50 - 7.70 x10^3/uL    LYMPHOCYTE # 0.67 (L) 1.00 - 4.80 x10^3/uL    MONOCYTE # 1.23 (H) 0.30 - 1.00 x10^3/uL    EOSINOPHIL # 0.11 0.00 - 0.50 x10^3/uL    BASOPHIL # 0.07 0.00  - 0.20 x10^3/uL   POC BLOOD GLUCOSE (RESULTS)   Result Value Ref Range    GLUCOSE, POC 118 (H) 70 - 105 mg/dl     Ordered Albumin 11/21/2017     Imaging Studies:    PVR 11/13/2017   Reviewing Sonographer Impression:    1. ABI's are artificially elevated (ABI >1.3) within the bilateral lower extremities due to non-compressible vessels.  2. Moderate arterial occlusive disease based on PVR and Doppler waveforms within the bilateral lower extremities.  3. Abnormal toe pressures (<71mmHg) at rest in bilateral lower extremities indicating poor wound healing potential.    Performing Tech:  Fredderick Severance, RVT & Finzel 11/13/2017, 18:01    This is a preliminary report until verified by a physician.     I have reviewed this non invasive vascular examination.    Conclusion:  1. Reliable ABIs could not be obtained due to non-compressible vessels related to patient body habitus and inability to cooperate.  2. Moderate arterial occlusive disease based on PVR and Doppler waveforms within the bilateral lower extremities.  3. Abnormal toe pressures (<2mmHg) at rest in bilateral lower extremities indicating poor wound healing potential.    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        Andrew Arias is a 62 year old caucasian male that was admitted with sepsis and hypotension. He has had fluid resuscitation as well as Levophed,. Levophed was being weaned. Patient has stated that Friday 11/09/2016 he had altered level of consciousness and was taken to the nearest hospital. He was treated and transferred to [facility] for a higher level of care. He states he had not had Bumex since Thursday night and had increased edema of lower extremities. The right anterior shin with open wound traumatic possible skin care vs bullae. Right lateral malleolus open wound. Patient states he had osteomyelitis of the right ankle in 2015. He states he did follow with wound care at an outside facility. The osteo was treated with external fixator for 4 months and was in a skilled care facility during that time. He remained to follow with that medical provider. He stated after one year the wound closed. He has neuropathy of bilateral feet and did not know he had an open wound and no care has been done. He ambulates with a cane due to the right ankle.    He is multiple co morbidities the first one being morbidly obese with limited movement and ambulation with a cane. He has Diabetes Mellitus that is not controlled and will need tight glycemic control. I would avoid compression of bilateral lower extremities due to EF of 25%. He also has neuropathy and cannot express how tight the wraps are on his right leg. He also will need correction of moderate calorie protein malnutrition.   Legs elevated   Application of Rooke boots  Tight glycemic control of diabetes mellitus  Correction of Protein Calorie Malnutrition  Due to the patient over all health status and multiple co morbidities of decreased mobility, moderate protein malnutrition and skin fragility, pre exsiting wounds, Andrew Arias is high risk for further skin breakdown.    Recommendations (if Consult)   -Specialty Bed may stay on the bed he is presently  on  -Meticulous Hygiene to the involved area  -Clean wound with Saline/Wound Cleanser and apply dressing of Santyl nickel thick with fluffed 4 x 4 to the right lateral malleolus, cover with ABD and rolled gauze  Q daily and prn  -Clean wound with saline/wound cleanser and apply dressing of Xerform , cover with ABD and kerlix to the right lower extremity Q day and prn  -Offload heels from bed with placement of pillow underneath calves  - Rooke boots bilateral lower extrmities  -Albumin was 2.6 on 11/11/2017- was reordered on 11/21/2017  -HA1C Level was 8.2 on 11/12/2017  -Nutrition Services consult for Protein Malnutrition  -PT/ OT to evaluate and treat for lower extremity weakness  -Case management for home health needs and discharge planning    I saw this patient independently of Dr.Karlissa Aron and spent greater than 50% of a 45 minute visit counseling the patient on dressing changes as well as his bedside nurse. The lymph edema wrap was placed at the bedside. The medical provider that ordered the lymph edema wraps can monitor. The dressing found was a Melgisorb foam dressing. I have recommended Xeroform at present due to the fibrinous tissue present in the wound of the anterior lower extremity. Santyl cannot bye used on the malleolus daily. I will let lymph edema therapy take over his wound care with their wrap of the right lower extremity.    I will sign off for  now an patient can be followed for wound care. Thank you.     Steva Colder, APRN,FNP-BC      The patient was seen independently by the APP  Unless directly noted, I did not evaluate the patient.  Signature required by employer    Roslyn Smiling, MD

## 2017-11-21 NOTE — Discharge Summary (Signed)
[facility]  DISCHARGE SUMMARY    PATIENT NAME:  Andrew Arias, Andrew Arias  MRN:  [id number]  DOB:  29-Mar-1956    ENCOUNTER DATE:  11/11/2017  INPATIENT ADMISSION DATE: 11/11/2017  DISCHARGE DATE:  11/22/2017    ATTENDING PHYSICIAN: Lurene Shadow, MD  SERVICE: MEDICINE 1  PRIMARY CARE PHYSICIAN: Judyann Munson, MD     Has PCP been verified with patient and updated? Yes    LAY CAREGIVER:  ,  ,        PRIMARY DISCHARGE DIAGNOSIS: Septic shock (CMS [facility])  Active Hospital Problems    Diagnosis Date Noted   . Principle Problem: Septic shock (CMS HCC) 11/11/2017   . Hypokalemia 11/18/2017   . UTI (urinary tract infection) 11/11/2017      Resolved Hospital Problems   No resolved problems to display.     Active Non-Hospital Problems    Diagnosis Date Noted   . Morbid obesity 11/11/2017        DISCHARGE MEDICATIONS:     Current Discharge Medication List      START taking these medications.      Details   empagliflozin 10 mg Tablet  Commonly known as:  JARDIANCE   10 mg, Oral, DAILY  Qty:  30 Tab  Refills:  0     insulin NPH isoph U-100 human 100 unit/mL Suspension   10 Units, Subcutaneous, 2 TIMES DAILY BEFORE MEALS  Qty:  10 mL  Refills:  0     torsemide 10 mg Tablet  Commonly known as:  DEMADEX   50 mg, Oral, 2 TIMES DAILY  Qty:  300 Tab  Refills:  0        CONTINUE these medications - NO CHANGES were made during your visit.      Details   allopurinol 300 mg Tablet  Commonly known as:  ZYLOPRIM   300 mg, Oral, DAILY  Refills:  0     aspirin 81 mg Tablet, Chewable   324 mg, Oral, DAILY  Refills:  0     colchicine 0.6 mg Tablet   0.6 mg, Oral, EVERY MO, WE AND FR  Refills:  0     digoxin 125 mcg Tablet  Commonly known as:  LANOXIN   0.125 mg, Oral, DAILY  Refills:  0     ENTRESTO 49-51 mg Tablet  Generic drug:  sacubitril-valsartan   1 Tab, Oral, 2 TIMES DAILY  Refills:  0     gabapentin 300 mg Capsule  Commonly known as:  NEURONTIN   300 mg, Oral, 3 TIMES DAILY, Take 600mg  in the morning, followed by 300mg  in the afternoon, then  600mg  in the evening.   Refills:  0     isosorbide dinitrate 30 mg Tablet  Commonly known as:  ISORDIL   30 mg, Oral, DAILY  Refills:  0     Leflunomide 20 mg Tablet  Commonly known as:  ARAVA   20 mg, Oral, DAILY  Refills:  0     magnesium oxide 400 mg (241.3 mg magnesium) Tablet  Commonly known as:  MAG-OX   400 mg, Oral, DAILY  Refills:  0     metFORMIN 500 mg Tablet  Commonly known as:  GLUCOPHAGE   500 mg, Oral, 2 TIMES DAILY WITH FOOD  Refills:  0     predniSONE 2.5 mg Tablet  Commonly known as:  DELTASONE   2.5 mg, Oral, DAILY BEFORE LUNCH  Refills:  0     simvastatin 20  mg Tablet  Commonly known as:  ZOCOR   20 mg, Oral, EVERY EVENING  Refills:  0        STOP taking these medications.    bumetanide 2 mg Tablet  Commonly known as:  BUMEX     carvedilol 12.5 mg Tablet  Commonly known as:  COREG     pioglitazone 15 mg Tablet  Commonly known as:  ACTOS     sitaGLIPtin 25 mg Tablet  Commonly known as:  JANUVIA          Discharge med list refreshed?  YES    ALLERGIES:  No Known Allergies    During this hospitalization did the patient have an AMI, PCI/PCTA, STENT or Isolated CABG?  No                    HOSPITAL PROCEDURE(S):   Bedside Procedures:  Orders Placed This Encounter   Procedures   . BEDSIDE  MD/DO FOLEY OR ST CATH INSERTION OR REMOVAL     Surgical     REASON FOR HOSPITALIZATION AND HOSPITAL COURSE     BRIEF HPI:  This is a 62 y.o., male with a significant past medical history including CAD status post CABG, CHF, uncontrolled type 2 diabetes, atrial fibrillation status post ablation on warfarin for anticoagulation, paced with AICD, CKD 3, gout, and RA admitted for septic versus cardiogenic shock who was admitted to the MICU originally from outside facility on 11/11/2017 complaining of several days of fatigue, minimal p.o. Intake, and altered mental status.  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  Patient was treated with vancomycin and Zosyn and bolused with 2 L of fluid; however, patient continued to have worsening confusion and refractory hypertension requiring Levophed pressure support.  Patient was then transferred to [facility] for higher level of care.      BRIEF HOSPITAL NARRATIVE:  Infectious workup while admitted to [facility] was unremarkable for any obvious source of infection with blood cultures and urine cultures persistently negative.  Renal ultrasound demonstrated perinephric stranding which was concerning for possible underlying pyelonephritis, and Urology was consulted for his urinary bladder dome lesion.  Patient was initiated on stress dose steroids and broad-spectrum antibiotics (linezolid, cefepime) which was de-escalated to Rocephin alone of which the patient completed a total 7 day course ending on 11/17/2017.  Urology was consulted and recommended outpatient follow-up with cystogram and recommended the patient be discharged with his Foley left in place.  The patient will follow up Urology following discharge (order placed in epic).  Of note, the patient was also found to have elevated creatinine of 3.6 on arrival with a baseline of 1.6 per outside records.  This was presumed to be a KI on CKD 3 secondary to ATN in the setting of sepsis/cardiogenic shock given the patient's heart failure history and unremarkable infectious workup.  Additionally, the patient has history of chronic lower extremity wounds secondary to lymphedema for which are lymphedema service was consulted and recommended wound care which was carried out throughout his admission.  His AICD was interrogated by the Cardiology EPS service and it was found to be functioning properly.  TTE during his admission demonstrated EF of 25% with multiple regions of akinesis and hypokinesis.  The Cardiology Heart failure service was also consulted and recommended further optimization of diuresis, and the patient was initially  diuresed with his home Bumex, which was changed to  IV Lasix 80 mg t.i.d. Which resulted in acceptable diuresis.  On 11/20/2017, Lasix was discontinued in favor of torsemide 50 mg b.i.d. P.o. which also achieved reasonable diuresis.  Of note, the patient received 1 dose of his home 5 mg warfarin on admission which resulted in supratherapeutic INR greater than 4. Warfarin was held after this, and his INR declined very slowly and was 2.62 at the time of his discharge.  On the night prior to his discharge, the patient was given 1 mg warfarin, and his warfarin will require adjustment and INR will need to be monitored on outpatient basis.  He is being discharged on 1 mg warfarin nightly at this time, with close follow up to adjust his dose as needed. His primary cardiologist and Ryan Park PA will follow his INR and the patient has outpatient follow-up scheduled for this.  His home Delene Loll was also re-initiated during his stay with good control of his blood pressure.  We recommended by Cardiology that his carvedilol continue to be held given his acute CHF exacerbation, and to be restarted on an outpatient basis by his primary cardiologist.  His digoxin was continued throughout his admission.  Additionally, the patient was found on CT to have an asymptomatic incidental left psoas hematoma.  The patient reported no history recent trauma, and it was assumed that this was most likely due to supratherapeutic INR.  Patient developed no issues or problems from this hematoma, recommend follow-up per PCP.  Additionally, during his stay, the patient noted that he had been rationing his Lantus due to expense; therefore, the decision was made to transition his Lantus to a cheaper alternative, and thus he is being discharged on NPH insulin 10 units b.i.d. And regular insulin on sliding scale which resulted in reasonable glucose control.  Additionally, per Cardiology, the patient was initiated on Jardiance 10 mg daily for improved  glucose control as well as improved diuresis in the setting of CHF. Patient will be discharged home with home health.    TRANSITION/POST DISCHARGE CARE/PENDING TESTS/REFERRALS:   - PCP (Dr. Janne Lab) is agreeable - PCP is agreeable to follow up on INR until patient is able to follow up with his cardiologist.  These appointments have been arranged by the [facility] team.  -will follow up with his cardiologist in [place] (consider restarting carvedilol on outpatient basis, follow INR)  -follow up with PCP Dr. Lieutenant Diego - this plan will be arranged by the [facility] team.  -follow-up with Urology for bladder dome lesion (order placed in epic)        CONDITION ON DISCHARGE:  A. Ambulation: Ambulation with assistive device  B. Self-care Ability: With partial assistance  C. Cognitive Status Alert and Oriented x 3  D. Code status at discharge:   Code Status Information     Code Status    Full Code                 LINES/DRAINS/WOUNDS AT DISCHARGE:   Patient Lines/Drains/Airways Status    Active Line / Dialysis Catheter / Dialysis Graft / Drain / Airway / Wound     Name: Placement date: Placement time: Site: Days:    Peripheral IV Right Arm;Forearm;Cephalic  (lateral side of arm)  11/18/17   1156   3    Foley Catheter  11/11/17   1200   10    Wound (Non-Surgical) Lower;Left;Right Leg  11/11/17   0748   11    Wound (Non-Surgical) Right Ankle  11/11/17   0730  11    Wound (Non-Surgical) Sacrum  11/11/17   0800   11    Wound (Non-Surgical) Right Toe 1  11/20/17   --   2                DISCHARGE DISPOSITION:  Home discharge and Home Health              DISCHARGE INSTRUCTIONS:  Follow-up Information     [facility], [place] .    Specialty:  Urology  Contact information:  [place] [address]  [phone]  Additional information:  Below are the driving directions to [facility].  For more information, please call [phone] or you can visit our website [email or web address].* From [address]:   Keep left at the fork to continue on [address], follow signs for [place].  Take exit 1 for [place] [address] toward [place] and continue 0.5 miles.  Turn left and use the right 2 lanes to merge onto [address] via the ramp to [place].  Continue 7.8 miles.* If traveling [place], [place] exit 1 for [place] [address] toward [place] and continue 0.5 miles.  Turn left and use the right 2 lanes to merge onto [address] via the ramp to [place].  Continue 9 miles.                  Refer to [place]     Follow-up in: [place]    Reason for visit: HOSPITAL DISCHARGE    Follow-up reason: bladder dome lesion, patient d/c'd with foley catheter (acute urinary retention)      DME - WOUND TREATMENT:APPLY TO WOUND    Lower extremity wound care::  Clean wound withSaline/Wound Cleanserand apply dressing of Santyl nickel thick with fluffed 4 x 4to theright lateral malleolus, cover with ABD and rolled gauzeQ dailyand prn  -Clean wound withsaline/wound cleanserand apply dressing of Xerform , cover with ABD and kerlixto the right lower extremityQ dayand prn  -Offload heels from bed with placement of pillow underneath calves     Ht 193 cm    Wt 149.6 kg    Current Attending: Wayland Denis    Medical Condition or Diagnosis which is primary reason for equipment: CHF, LE extremity wounds,    Start Date 11/21/2017    Apply To Wound z-Other (Specify in Comments)    Secure Dressing With z-Other (Specify in Comments)    Change Dressing Daily         Joshua C. Lurline Hare MD, PhD   PGY1, Transitional Year Resident   Pager [phone]      Copies sent to Care Team       Relationship Specialty Notifications Start End    Judyann Munson, MD PCP - General EXTERNAL  11/21/17     Verified by transitions team 11/21/17 --CMM    Phone: [phone] Fax: [phone]         [place] [address]    Doreatha Martin, MD PCP - Cardiologist EXTERNAL  11/21/17     Phone:  [phone] Fax: [phone]         [facility] [place] [address]          Referring providers can utilize [email or web address] to access their referred [place] patient's information.         I saw and examined  the patient.  I reviewed the resident's note.  I agree with the findings and plan of care as documented in the resident's note.  Any exceptions/additions are edited/noted.    Lurene Shadow, MD

## 2017-11-21 NOTE — Consults (Signed)
Andrew Arias  Follow up Note:    Andrew Arias, Andrew Arias, 62 y.o. male  Date of Service: 11/21/2017  Date of Birth: 1956-07-11  LOS: 10 days  Requesting Provider/Service: MICU 2    Reason for Admission: Heart Failure        Subjective:      No overnight event including CP or new SOB. Pt is sitting in the chair.    Past Medical History:   Diagnosis Date   . A-fib (CMS HCC)     s/p ablation   . Anemia    . Anticoagulant long-term use     Warfarin   . Arthritis    . CAD (coronary artery disease)     s/p CABG   . Cardiac LV ejection fraction 21-30%    . Cardiomyopathy (CMS [place])     EF 25%   . Cellulitis of lower extremity    . CHF (congestive heart failure) (CMS HCC)    . CKD (chronic kidney disease), stage III (CMS HCC)    . Colonic polyp    . Constipation    . DM2 (diabetes mellitus, type 2) (CMS HCC)    . Edema     bilateral lower extremity   . Gout    . History of cellulitis    . HLD (hyperlipidemia)    . HTN (hypertension)    . Morbid obesity (CMS [place])    . Neuropathy (CMS HCC)     bilateral feet   . Osteomyelitis (CMS [place]) 2015    right ankle and was treated with external fixator for 4 months followed by multiple debridements   . Pacemaker    . Urinary retention    . Use of cane as ambulatory aid     states due to his right ankle- also has a walker and wheelchair           Past Surgical History:   Procedure Laterality Date   . HX CARDIAC ABLATION     . HX COLONOSCOPY     . HX CORONARY ARTERY BYPASS GRAFT     . HX PACEMAKER DEFIBRILLATOR PLACEMENT             Patient Active Problem List   Diagnosis   . UTI (urinary tract infection)   . Morbid obesity   . Septic shock (CMS HCC)   . Hypokalemia       Prior to Admission Medications:  Medications Prior to Admission     Prescriptions    allopurinol (ZYLOPRIM) 300 mg Oral Tablet    Take 300 mg by mouth Once a day    aspirin 81 mg Oral Tablet, Chewable    Take 324 mg by mouth Once a day    bumetanide (BUMEX) 2 mg Oral Tablet    Take 2 mg by  mouth Twice daily    carvedilol (COREG) 12.5 mg Oral Tablet    Take 12.5 mg by mouth Twice daily with food    colchicine 0.6 mg Oral Tablet    Take 0.6 mg by mouth Every Monday, Wednesday and Friday    digoxin (LANOXIN) 125 mcg Oral Tablet    Take 0.125 mg by mouth Once a day    gabapentin (NEURONTIN) 300 mg Oral Capsule    Take 300 mg by mouth Three times a day Take 600mg  in the morning, followed by 300mg  in the afternoon, then 600mg  in the evening.    Leflunomide (ARAVA) 20 mg Oral Tablet  Take 20 mg by mouth Once a day    magnesium oxide (MAG-OX) 400 mg (241.3 mg magnesium) Oral Tablet    Take 400 mg by mouth Once a day    metFORMIN (GLUCOPHAGE) 500 mg Oral Tablet    Take 500 mg by mouth Twice daily with food    pioglitazone (ACTOS) 15 mg Oral Tablet    Take 15 mg by mouth Once a day    predniSONE (DELTASONE) 2.5 mg Oral Tablet    Take 2.5 mg by mouth Once a day before lunch    sacubitril-valsartan (ENTRESTO) 49-51 mg Oral Tablet    Take 1 Tab by mouth Twice daily    simvastatin (ZOCOR) 20 mg Oral Tablet    Take 20 mg by mouth Every evening          Past Family History:   Family Medical History:     Problem Relation (Age of Onset)    Arthritis-rheumatoid Father, Brother    Congestive Heart Failure Mother    Coronary Artery Disease Father          Social History  Social History     Substance and Sexual Activity   Alcohol Use Yes   . Frequency: 2-4 times a month      reports that he quit smoking about 7 years ago. His smoking use included cigarettes. He has never used smokeless tobacco.   reports that he does not use drugs.    ROS: Other than ROS in the HPI, all other systems were negative.      Exam:   Temperature: 36.5 C (97.7 F)  Heart Rate: 74  BP (Non-Invasive): 90/66  Respiratory Rate: 16  SpO2: 90 %  Pain Score (Numeric, Faces): 7    Date 11/21/17 0700 - 11/22/17 0659   Shift 0700-1459 1500-2259 2300-0659 24 Hour Total   INTAKE   P.O. 240   240     Oral 240   240   Shift Total(mL/kg) 240(1.6)   240(1.6)     OUTPUT   Urine(mL/kg/hr) 100   100   Shift Total(mL/kg) 100(0.67)   100(0.67)   NET 140   140   Weight (kg) 149.6 149.6 149.6 149.6       Current Weight:   Patient Vitals for the past 48 hrs:   Weight   11/21/17 7062 (!) 149.6 kg (329 lb 12.9 oz)   11/20/17 0700 (!) 149.9 kg (330 lb 7.5 oz)       General:NAD,appears chronically ill  HENT:NC/AT; oral mucous membranesmoist  Eyes:conjunctivaeclear, non-icteric; PERRLA, L lateral upper lid lesion  Neck:trachea midline  Cardiovascular:RRR; normal B7/S2; holosystolic murmur  Pulmonary / Chest:Normal respiratory effort; CTAB but mild crackles at bilateral bases. NC in place  GI / Abdominal:Morbidly obese, soft, non-distended; BS normal; no TTP  Extremities:bilateral pitting edema now only pedal edema only;nocyanosis; well-perfused  Skin:Warm and dry, venous stasis dermatitis changes over bilateral legs with hyperpigmentation and some superficial fluid blisters, R>L; wound dressings in place  Neurologic:CN grossly intact. Mentation improved    LABS:  Results for orders placed or performed during the hospital encounter of 11/11/17 (from the past 24 hour(s))   BASIC METABOLIC PANEL   Result Value Ref Range    SODIUM 139 136 - 145 mmol/L    POTASSIUM 4.4 3.5 - 5.1 mmol/L    CHLORIDE 98 96 - 111 mmol/L    CO2 TOTAL 30 22 - 32 mmol/L    ANION GAP 11 4 - 13 mmol/L  CALCIUM 8.6 8.5 - 10.2 mg/dL    GLUCOSE 107 65 - 139 mg/dL    BUN 29 (H) 8 - 25 mg/dL    CREATININE 1.04 0.62 - 1.27 mg/dL    BUN/CREA RATIO 28 (H) 6 - 22    ESTIMATED GFR >59 >59 mL/min/1.9m^2   CBC/DIFF    Narrative    The following orders were created for panel order CBC/DIFF.  Procedure                               Abnormality         Status                     ---------                               -----------         ------                     CBC WITH DIFF[[id number]]                Abnormal            Final result                 Please view results for these tests on the individual  orders.   MAGNESIUM   Result Value Ref Range    MAGNESIUM 1.9 1.6 - 2.5 mg/dL   PHOSPHORUS   Result Value Ref Range    PHOSPHORUS 2.7 2.3 - 4.0 mg/dL   PT/INR   Result Value Ref Range    PROTHROMBIN TIME 31.4 (H) 9.5 - 14.1 seconds    INR 2.74 (H) 0.80 - 1.20    Narrative    Coumadin therapy INR range for Conventional Anticoagulation is 2.0 to 3.0 and for Intensive Anticoagulation 2.5 to 3.5.   CBC WITH DIFF   Result Value Ref Range    WBC 9.1 3.5 - 11.0 x10^3/uL    RBC 3.50 (L) 4.06 - 5.63 x10^6/uL    HGB 11.2 (L) 12.5 - 16.3 g/dL    HCT 33.4 (L) 36.7 - 47.0 %    MCV 95.4 78.0 - 100.0 fL    MCH 31.9 27.4 - 33.0 pg    MCHC 33.5 32.5 - 35.8 g/dL    RDW 18.1 (H) 12.0 - 15.0 %    PLATELETS 242 140 - 450 x10^3/uL    MPV 7.0 (L) 7.5 - 11.5 fL    NEUTROPHIL % 77 %    LYMPHOCYTE % 7 %    MONOCYTE % 14 %    EOSINOPHIL % 1 %    BASOPHIL % 1 %    NEUTROPHIL # 7.00 1.50 - 7.70 x10^3/uL    LYMPHOCYTE # 0.67 (L) 1.00 - 4.80 x10^3/uL    MONOCYTE # 1.23 (H) 0.30 - 1.00 x10^3/uL    EOSINOPHIL # 0.11 0.00 - 0.50 x10^3/uL    BASOPHIL # 0.07 0.00 - 0.20 x10^3/uL   ALBUMIN   Result Value Ref Range    ALBUMIN 2.6 (L) 3.4 - 4.8 g/dL   POC BLOOD GLUCOSE (RESULTS)   Result Value Ref Range    GLUCOSE, POC 147 (H) 70 - 105 mg/dl   POC BLOOD GLUCOSE (RESULTS)   Result Value Ref Range    GLUCOSE, POC 104 70 - 105 mg/dl  POC BLOOD GLUCOSE (RESULTS)   Result Value Ref Range    GLUCOSE, POC 118 (H) 70 - 105 mg/dl     Lab Results   Component Value Date    CO2 30 11/21/2017    CO2 28 11/20/2017    BUN 29 (H) 11/21/2017    BUN 29 (H) 11/20/2017    CREATININE 1.04 11/21/2017    CREATININE 0.82 11/20/2017    WBC 9.1 11/21/2017    WBC 8.7 11/20/2017    HGB 11.2 (L) 11/21/2017    HGB 10.6 (L) 11/20/2017     Lab Results   Component Value Date    AST 36 11/11/2017    AST 35 11/11/2017    ALT 9 11/11/2017    ALT 10 11/11/2017     No results found for: TRIG, HDL  No results found for: TSH  INR (no units)   Date Value   11/21/2017 2.74 (H)   11/20/2017 2.99  (H)   11/19/2017 3.38 (H)         Additional lab testing ordered: none    Chest X-ray:   11/11/2017  IMPRESSION:  Cardiomegaly without acute process.    TTE 11/12/2017:  Findings:  Left Ventricle: Normal left ventricular size. Severely depressed left ventricular systolic function. LV Ejection Fraction  is 25 %. Normal geometry. Paradoxical septal motion consistent with RV pacemaker.  Resting Segmental Wall Motion Analysis: Total wall motion score is 2.24. There is akinesis of the mid to apical  anterolateral wall. There is akinesis of the apical cap. There is akinesis of the apical inferoseptal wall. The remaining  left ventricular segments demonstrate hypokinesis.  Right Ventricle: Mildly dilated right ventricle. Mildly depressed right ventricular systolic function. Echo density in  right ventricle suggestive of catheter, pacer lead, or ICD lead.  Left Atrium: The left atrium is normal in size.  Right Atrium: Mildly dilated right atrium.  Atrial Septum: The interatrial septum is normal in appearance.  Mitral Valve: There is moderate secondary mitral regurgitation.  Aortic Valve: Normal aortic valve. No aortic regurgitation seen. Trileaflet aortic valve. No Aortic Valve Stenosis.  Tricuspid Valve: There is moderate tricuspid regurgitation.  Pulmonic Valve: Normal pulmonic valve.  Pericardium: Normal pericardium with no pericardial effusion.  Aorta: Normal aortic root.  IVC: The inferior vena cava is dilated.  Pulmonary Artery: Normal pulmonary artery size.      Heart Failure Therapies:   ARN1/ACE-I/ARB: No: Why? hypotension  Beta Blocker: No: Why? hypotension  Aldosterone antagonist: No: Why? hypotension  Device: Yes   CRT-D    Family Communication:   Code Status:  Code Status Information     Code Status    Full Code            Assessment/Recommendations:  Acute on Chronic Systolic CHF. Requiring Cardiogenic Shock: no / ischemic cardiomyopathy/ Mild right ventricular dysfunction / Clinically mildly fluid overloaded on  examination/ ACC/AHA Stage D, NYHA functional classIII    Diagnosis   . Essential hypertension, benign   . Pure hypercholesterolemia   . Coronary atherosclerosis of native coronary artery   . Splenomegaly   . Postsurgical percutaneous transluminal coronary angioplasty status   . Cardiomyopathy (Notable Code)   . Dyspnea   . Long term current use of anticoagulant therapy   . Fatigue   . Shortness of breath   . Edema   . Chronic a-fib (Notable Code)   . Ventricular tachycardia seen on cardiac monitor (Notable Code)   . Acute on chronic systolic heart failure (  Notable Code)   . Morbid obesity with BMI of 40.0-44.9, adult (Notable Code)   . CABG x 4 --LIMA->D,LIMA->LAD, SVG->OM, RIMA->PDA on 06/15/14 by Dr.Navid   . AICD (automatic cardioverter/defibrillator) present   . Sleep apnea   . Bacteremia   . Osteomyelitis of ankle (Notable Code)   . Pulmonary HTN (Notable Code)   . Decubitus ulcer of right ankle, stage 4 (Notable Code)   . Primary cardiomyopathy (Notable Code)   . Type 2 diabetes mellitus without complication (Notable Code)   . Encounter for long-term (current) use of medications   . CHF exacerbation (Notable Code)   . Screening for thyroid disorder   . Screening, anemia, deficiency, iron   . Cardiac LV ejection fraction 15% as shown on Echocardiogram of 09-08-2015    Urosepsis  Supratheraputic INR  AKI over CKD stage III  AF on warfarin  DM type II    -  Pt is on  torsemide 50 mg BID and  He had UOP for 24 hrs for 2325 ml, negative 1665 ml and negative 17.7L since admission here.  - Please do Strict I/O, daily weight, fluid restriction and 2 g sodium diet.  - Agree with Lymphedema consult here.  - Currently on Entresto 40 -51 mg BID home dose.  - Pt will benefits from CPAP at night for his OSA and will benefit from OSA eval as outpt.  - Also pt will benefits from SGLT2 I medication here for DM and CHF management  - Pt is on Bumex 2mg  BID, Entresto 49-51 mg BID and Coreg 12.5 mg BID at home.  - Recommended to  restart the home BB as outpt setting with recent HF event.  - Pt will need outpt f/u from his cardiologist at [facility] upon dc within 1-2 weeks and please schedule it before dc.  - We will sign off at this time as pt is waiting for SNF placement only but please call us for any new concern and while pt is here we will peripherally f/u with him.  Please place consult order in [facility] if not done yet.    Thank you for allowing [place] to participate in the care of your patient.  If you have any questions please do not hesitate to call.     Holley Bouche, MD      11/21/2017  I saw and examined the patient.  I reviewed the fellow's note.  I agree with the findings and plan of care as documented in the fellow's note.  Any exceptions/additions are edited/noted.    Noralee Stain, DO

## 2017-11-21 NOTE — Care Management Notes (Signed)
[facility]  Care Management Note    Patient Name: Andrew Arias  Date of Birth: 1956/04/25  Sex: male  Date/Time of Admission: 11/11/2017  7:20 AM  Room/Bed: 805/A  Payor: [place] / Plan: [place] [facility] / Product Type: MEDICARE [facility] /    LOS: 10 days   Primary Care Providers:  Pcp, No (General)    Admitting Diagnosis:  UTI (urinary tract infection) [N39.0]    Assessment:      11/21/17 1152   Assessment Details   Assessment Type Continued Assessment   Date of Care Management Update 11/21/17   Date of Next DCP Update 11/22/17   Medicare Intent to Discharge Documentation   Discharge IMM give to: Patient   Discharge IMM Letter Given Date 11/21/17   Discharge IMM Letter Given Time 1123   IMM explained/reviewed with:  Patient;verbalized understanding   Care Management Plan   Discharge Planning Status plan in progress   Projected Discharge Date 11/22/17   CM will evaluate for rehabilitation potential yes   Discharge Needs Assessment   Equipment Currently Used at Home walker, standard   Equipment Needed After Discharge none   Discharge Facility/Level of Care Needs Home with Home Health (code 6)   Transportation Available car   Referral Information   Admission Type inpatient       Discharge Plan:  Home with Home Health (code 6)  PT/OT recommending home discharge with [facility] and 24/7 assistance. MSW met with pt at bedside who reports that his wife is able to help him; however, the home is not ready and he cannot go until tomorrow 3/28. Pt reports that his daughter will be able to transport him home as well. Pt states that he has used [facility] in the past for [facility] services and would like to utilize them again. MSW obtained signature for Freedom of Choice and checked benefits. [facility] HH covered at 100%. MSW explained IMM to pt who verbalized understanding and signed copy for chart. HH orders pending MD signature. Nursing number for report placed in AVS.     The patient will continue to be evaluated for developing  discharge needs.     Case Manager: Theodis Aguas  Phone: [phone]

## 2017-11-21 NOTE — Care Plan (Signed)
Plan of care reviewed with patient. Patient admitted with septic shock/UTI and hypokalemia. Maintain fall and skin precautions, bed alarm on for safety. Lymphedema wrap applied to right leg where vascular wounds are also located, dressing remains clean, dry, and intact. [facility] bed. PRN Tramadol for complaints of lower back and left hip pain. Continue PO diuretics. Continue 2000cc fluid restriction. Ambulating with assist of 2 and walker. Refusing bipap HS, wears 2L O2 when sleeping. D/C plan to SNF. Will continue to monitor labs, vitals, I&O.     Problem: Adult Inpatient Plan of Care  Goal: Plan of Care Review  Outcome: Ongoing (see interventions/notes)  Goal: Patient-Specific Goal (Individualization)  Outcome: Ongoing (see interventions/notes)  Goal: Absence of Hospital-Acquired Illness or Injury  Outcome: Ongoing (see interventions/notes)  Goal: Optimal Comfort and Wellbeing  Outcome: Ongoing (see interventions/notes)     Problem: Fall Injury Risk  Goal: Absence of Fall and Fall-Related Injury  Outcome: Ongoing (see interventions/notes)     Problem: Skin Injury Risk Increased  Goal: Skin Health and Integrity  Outcome: Ongoing (see interventions/notes)

## 2017-11-21 NOTE — Progress Notes (Signed)
INTERNAL MEDICINE  Progress Note     Name: Andrew Arias, Andrew Arias, 62 y.o. male Date of Service: 11/21/2017   FXT:[id number] LOS: 50   PCP:No Pcp Attending: Lurene Shadow, MD   Code Status: Full Code Admitted for: Septic shock (CMS [place])     SUBJECTIVE:      No acute events overnight.  Patient leaning forward over chair this morning which he says relieves his bladder spasms.  He says these are somewhat improved since starting Pyridium yesterday.  He says that his back pain is also mildly improved after beginning tramadol.  He denies any current chest pain or shortness of breath.  He has no complaints this time.     OBJECTIVE:    EXAMINATION:  Temperature: 36.3 C (97.3 F) BP (Non-Invasive): 90/66 Heart Rate: 74   Respiratory Rate: 16 SpO2: 90 % Weight: (!) 149.6 kg (329 lb 12.9 oz)          Intake/Output Summary (Last 24 hours) at 11/21/2017 0756  Last data filed at 11/21/2017 3299  Gross per 24 hour   Intake 660 ml   Output 1575 ml   Net -915 ml     Last Bowel Movement: 11/20/17     General: Well-nourished, well-developed, in NAD.  HEENT: NC/AT, EOMI, Oropharynx clear, mucous membranes moist, Trachea midline.  Cardiovascular:  Systolic murmur; no rubs, gallops.  Respiratory: CTABL  Abdominal: Bowel sounds normal; abdomen soft, non-tender to palpation  Extremities/Skin:  Significant lower extremity right greater than left edema. Warm and dry.  Neurological: Awake, A&O x 3.     LABS:    CBC Differential   Recent Labs     11/19/17  0514 11/20/17  0421 11/21/17  0537   WBC 10.3 8.7 9.1   HGB 11.3* 10.6* 11.2*   HCT 34.0* 31.9* 33.4*   PLTCNT 224 204 242    Recent Labs     11/19/17  0514 11/20/17  0421 11/21/17  0537   PMNS 85 81 77   LYMPHOCYTES 5 6 7    MONOCYTES 9 12 14    EOSINOPHIL 1 1 1    BASOPHILS 1  0.06 1  0.06 1  0.07   PMNABS 8.74* 7.01 7.00   LYMPHSABS 0.51* 0.51* 0.67*   MONOSABS 0.92 1.03* 1.23*   EOSABS 0.11 0.08 0.11      BMP LFTs   Recent Labs     11/21/17  0537   SODIUM 139   POTASSIUM 4.4   CHLORIDE 98   CO2  30   BUN 29*   CREATININE 1.04   GLUCOSENF 107   ANIONGAP 11   BUNCRRATIO 28*   GFR >59   CALCIUM 8.6   MAGNESIUM 1.9   PHOSPHORUS 2.7    No results found for this encounter   CoAgs Blood Gas:   Recent Labs     11/21/17  0537   PROTHROMTME 31.4*   INR 2.74*    No results found for this encounter    Cardiac Markers Lipid Panel   No results for input(s): TROPONINI, CKMB, MBINDEX, BNP in the last 72 hours. No results found for this encounter     IMAGING STUDIES:         PATHOLOGY & MICROBIOLOGY:    No results found for any visits on 11/11/17 (from the past 24 hour(s)).    INPATIENT MEDICATIONS:    Current Facility-Administered Medications:  acetaminophen (TYLENOL) tablet 650 mg Oral Q4H PRN   aspirin chewable tablet 81 mg 81 mg Oral  Daily   atorvastatin (LIPITOR) tablet 80 mg Oral QPM   collagenase (SANTYL) 250 unit/gm ointment  Apply Topically Daily   digoxin (LANOXIN) tablet 125 mcg Oral Daily   docusate sodium (COLACE) capsule 100 mg Oral 2x/day   insulin NPH human 100 units/mL injection 10 Units Subcutaneous 2x/day AC   insulin R human 100 units/mL injection 2 Units Subcutaneous 3x/day AC   lidocaine-menthol (LIDOPATCH) 3.6%-1.25% patch 1 Patch Transdermal Daily   magnesium oxide (MAG-OX) tablet 400 mg Oral 2x/day   NS flush syringe 2 mL Intracatheter Q8HRS   And      NS flush syringe 2-6 mL Intracatheter Q1 MIN PRN   nystatin (MYCOSTATIN) 100,000 units per mL oral liquid 5 mL Swish & Spit 4x/day   nystatin (NYSTOP) 100,000 units/g topical powder  Apply Topically 2x/day   ondansetron (ZOFRAN) 2 mg/mL injection 4 mg Intravenous Q8H PRN   phenazopyridine (PYRIDIUM) tablet 100 mg Oral 3x/day-Meals   phenol (CHLORASEPTIC) 1.4% oromucosal spray 1 Spray Mouth/Throat Q4H PRN   polyethylene glycol (MIRALAX) oral packet 17 g Oral Daily   predniSONE (DELTASONE) tablet 2.5 mg Oral Daily with Breakfast   sacubitril-valsartan (ENTRESTO) 49-51 mg per tablet 1 Tab Oral 2x/day   sennosides-docusate sodium (SENOKOT-S) 8.6-50mg  per  tablet 1 Tab Oral 2x/day   SSIP insulin R human (HUMULIN R) 100 units/mL injection 2-6 Units Subcutaneous 4x/day PRN   torsemide (DEMADEX) tablet 50 mg 50 mg Oral 2x/day   traMADol (ULTRAM) tablet 50 mg Oral Q6H PRN        ASSESSMENT:     CHF, uncontrolled DM 2, atrial fibrillation status post ablation on warfarin for anticoagulation, paced with AICD, CKD 3, gout, and RA who presented originally from [facility] for shock, now as a MICU transfer to the medicine service.    Active Hospital Problems    Diagnosis   . Primary Problem: Septic shock (CMS HCC)   . Hypokalemia   . UTI (urinary tract infection)        PLAN:     Acute on chronic CHF exacerbation  A-fib on warfarin- supratherapeutic INR   - TTE; EF 25% with multiple regions of marked akinesis/hypokinesis  - cardiology HF consulted; appreciate recs  - Cardiology blue following pt; restarted entresto; switched bumex to lasix 80 mg TID po, then 80 mg t.i.d. IV.  - lasix d/c on 3/26 switched to torsemide 50 mg bid   - will continue to monitor BMP q12 hr.  -aggressively replacing potassium 4 goal greater than 4. And Mag greater than 2.    - 2.325 L neck urine output yesterday.  Since admission, net -17 .6 L.   - strict I&Os    - Continue dig  - Holding beta blocker; per cards, can be restarted in outpatient setting  - Holding warfarin due to supra therapeutic INR -INR decreased this morning to 2.99  - Aspirin and lipitor 80 mg  - lymphedema consult ordered; appreciate recs.    Back pain stable  - patient attributes lumbar back pain to discomfort in his bed  - not significantly relied by lidocaine patches   - tramadol 50 mg q6 hr prn    Bladder dome lesion:  - CT renal calculus protocol and cystogram reviewed  - urology consulted  - s/p uretheral stricture dilation  - no additional imaging requiring  - pt will need f/u cystoscopy outpatient; urology has referred (3 week follow-up; orders placed in epic)  - pt to be d/c with foley  -  patient complains of  feelings of bladder spams; patient on 2 day course of pyridium.     AKI on CKD III -resolved  - likely 2/2 septic shock; per report, baseline Cr ~1.6; was 3.6 at OSH  - urine lytes suggest pre-renal etioogy  - strict I/Os  -creatinine 1.41 at time of transfer.  -will continue to monitor with daily BMPs.    Shock, most likely combined septic/cardiogenic secondary to UTI/pyelonephritis versus lower extremity cellulitis -- resolved  - blood cultures, urine cultures no growth   -patient initially treated with linezolid, cefepime; de-escalated to Rocephin for total 7 day course on 11/13/2017, with stop date of 11/17/2017.   -Levophed discontinued on 11/14/2016  -Concern for bladder diverticula/uretheral stricture on initial presentation. Urology consulted; foley in place  -stress dose steroids weaned to home prednisone 2.5 mg  (patient takes for RA on outpatient basis)    Probable left psoas hematoma:  - noted on CT 3/18, asymptomatic  - no hx recent trauma; likely 2/2 supratherapeutic INR  -will monitor.    Lymphedema  lower extremity wounds:  - wound careand lymphedema OTconsulted; appreciate recs    DM2, uncontrolled:  - A1c 8.2  - hold home PO agents  - SSI  - Will initiate NPH 10 units BID and regular insulin to make it more affordable for the patient  - Plan to discharge patient on jardiance (care management contacted to help with coupons since cost is an issue for the patient)    Back pain  - patient complaining of back pain this morning, offered lidocaine patches.     Placement pending       Chronic medical problems:  - CAD s/p CABG  HTN  HLD: hold coreg  - gout: hold allopurinol, colchicine  - BPH: hold tamsulosin  - Hypomagnesemia: hold PO supplement, replace prn  - TJ:[id number], holdleflunamide    ____________________________________________________________________    Prophylaxis:  - DVT: held d/t INR  - GI: Not indicated  Analgesia:None  Nutrition:Diabetic and Cardiac  Therapy: OT  and PT  Bowel regimen:senna, colace      Joshua C. Lurline Hare MD, PhD   PGY1, Transitional Year Resident   Pager [phone]      I saw and examined the patient.  I reviewed the resident's note.  I agree with the findings and plan of care as documented in the resident's note.  Any exceptions/additions are edited/noted.    Lurene Shadow, MD

## 2017-11-22 LAB — CBC WITH DIFF
BASOPHIL #: 0.07 x10ˆ3/uL (ref 0.00–0.20)
EOSINOPHIL #: 0.11 x10ˆ3/uL (ref 0.00–0.50)
EOSINOPHIL %: 1 %
HCT: 33 % — ABNORMAL LOW (ref 36.7–47.0)
HGB: 10.9 g/dL — ABNORMAL LOW (ref 12.5–16.3)
LYMPHOCYTE #: 0.72 x10?3/uL — ABNORMAL LOW (ref 1.00–4.80)
LYMPHOCYTE #: 0.72 x10ˆ3/uL — ABNORMAL LOW (ref 1.00–4.80)
LYMPHOCYTE %: 9 %
MCH: 31.3 pg (ref 27.4–33.0)
MCHC: 33 g/dL (ref 32.5–35.8)
MCV: 94.8 fL (ref 78.0–100.0)
MONOCYTE #: 1.11 x10ˆ3/uL — ABNORMAL HIGH (ref 0.30–1.00)
MONOCYTE %: 13 %
MPV: 7 fL — ABNORMAL LOW (ref 7.5–11.5)
NEUTROPHIL #: 6.34 x10ˆ3/uL (ref 1.50–7.70)
NEUTROPHIL %: 76 %
PLATELETS: 239 x10ˆ3/uL (ref 140–450)
RBC: 3.48 x10ˆ6/uL — ABNORMAL LOW (ref 4.06–5.63)
RDW: 17.9 % — ABNORMAL HIGH (ref 12.0–15.0)
WBC: 8.3 x10?3/uL (ref 3.5–11.0)
WBC: 8.3 x10ˆ3/uL (ref 3.5–11.0)

## 2017-11-22 LAB — PT/INR: INR: 2.62 — ABNORMAL HIGH (ref 0.80–1.20)

## 2017-11-22 LAB — BASIC METABOLIC PANEL
ANION GAP: 8 mmol/L (ref 4–13)
BUN/CREA RATIO: 26 — ABNORMAL HIGH (ref 6–22)
BUN: 30 mg/dL — ABNORMAL HIGH (ref 8–25)
CALCIUM: 8.9 mg/dL (ref 8.5–10.2)
CHLORIDE: 96 mmol/L (ref 96–111)
CO2 TOTAL: 31 mmol/L (ref 22–32)
CREATININE: 1.15 mg/dL (ref 0.62–1.27)
ESTIMATED GFR: >59 mL/min/1.73mˆ2 (ref >59)
GLUCOSE: 94 mg/dL (ref 65–139)
POTASSIUM: 4.4 mmol/L (ref 3.5–5.1)
SODIUM: 135 mmol/L — ABNORMAL LOW (ref 136–145)

## 2017-11-22 LAB — POC BLOOD GLUCOSE (RESULTS): GLUCOSE, POC: 152 mg/dl — ABNORMAL HIGH (ref 70–105)

## 2017-11-22 LAB — PHOSPHORUS: PHOSPHORUS: 2.7 mg/dL (ref 2.3–4.0)

## 2017-11-22 LAB — MAGNESIUM: MAGNESIUM: 2.2 mg/dL (ref 1.6–2.5)

## 2017-11-22 MED ORDER — WARFARIN 1 MG TABLET
1.00 mg | ORAL_TABLET | Freq: Every evening | ORAL | 0 refills | Status: AC
Start: 2017-11-22 — End: ?

## 2017-11-22 MED ORDER — EMPAGLIFLOZIN 10 MG TABLET
10.0000 mg | ORAL_TABLET | Freq: Every day | ORAL | 0 refills | Status: AC
Start: 2017-11-22 — End: 2017-12-22

## 2017-11-22 MED ORDER — INSULIN NPH ISOPHANE U-100 HUMAN 100 UNIT/ML SUBCUTANEOUS SUSPENSION
10.00 [IU] | Freq: Two times a day (BID) | SUBCUTANEOUS | 0 refills | Status: AC
Start: 2017-11-22 — End: 2017-12-22

## 2017-11-22 MED ORDER — TORSEMIDE 10 MG TABLET
50.00 mg | ORAL_TABLET | Freq: Two times a day (BID) | ORAL | 0 refills | Status: AC
Start: 2017-11-22 — End: 2017-12-22

## 2017-11-22 MED ADMIN — sennosides 8.6 mg-docusate sodium 50 mg tablet: ORAL | @ 09:00:00

## 2017-11-22 MED ADMIN — nystatin 100,000 unit/gram topical powder: ORAL | @ 09:00:00 | NDC 00574200815

## 2017-11-22 MED ADMIN — heparin (porcine) 5,000 unit/mL injection solution: ORAL | @ 09:00:00

## 2017-11-22 NOTE — Ancillary Notes (Signed)
[facility] Medicine   Transition of Care Coordination   PCP Verification        Name: Taedyn Glasscock  Date of Birth: 19-Jun-1956 male  LOS: 15  Date/Time of Admission: 11/11/2017  7:20 AM   Service: MEDICINE 1    Scheduled appointment 11/26/17@1 :31 with Dr Arnette Norris office. Added to AVS. Please put attention Ria Comment on the discharge papers when faxed to Dr. Arnette Norris office.      Loutricia Sandoval  11/22/2017, 10:33

## 2017-11-22 NOTE — Care Management Notes (Signed)
Referral Information  ++++++ Placed Provider #1 ++++++  Case Manager: Erica Georgiana  Provider Type: Nursing Home/SNF  Address:  ,    Contact:    Fax:   Fax:

## 2017-11-22 NOTE — Care Management Notes (Signed)
Referral Information  ++++++ Placed Provider #1 ++++++  Case Manager: Theodis Aguas  Provider Type: Home Health  Provider Name: [facility]  Address:  [address] [place] [address]  Contact: Regina Eck    Phone: [phone] x  Fax:   Fax: [phone]

## 2017-11-22 NOTE — Discharge Instructions (Signed)
Discharge Recommendations/ Plan:Discharge JS:UNHR with Home Health (code 6)      Resources:     [place]        Please follow up with Dr. Aline August at [facility], [phone], on Monday, April 8th at 9:00am. Thank you.

## 2017-11-22 NOTE — Care Management Notes (Signed)
Referral for wound care supplies sent to [facility].

## 2017-11-22 NOTE — Care Management Notes (Signed)
Referral Information  ++++++ Placed Provider #1 ++++++  Case Manager: Erica Georgiana  Provider Type: Acute -Rehab  Address:  ,    Contact:    Fax:   Fax:

## 2017-11-22 NOTE — Progress Notes (Signed)
INTERNAL MEDICINE  Progress Note     Name: Andrew Arias, Andrew Arias, 62 y.o. male Date of Service: 11/22/2017   QJJ:[id number] LOS: 68   KGY:[id number] Janne Lab, MD Attending: Lurene Shadow, MD   Code Status: Full Code Admitted for: Septic shock (CMS [place])     SUBJECTIVE:      No acute events overnight.  Patient leaning forward over chair this morning which he says relieves his bladder spasms.  He says these are somewhat improved since starting Pyridium yesterday.  He says that his back pain is also mildly improved after beginning tramadol.  He denies any current chest pain or shortness of breath.  He has no complaints this time.     OBJECTIVE:    EXAMINATION:  Temperature: 36.4 C (97.6 F) BP (Non-Invasive): 101/84 Heart Rate: 80   Respiratory Rate: 16 SpO2: 93 % Weight: (!) 149.6 kg (329 lb 12.9 oz)          Intake/Output Summary (Last 24 hours) at 11/22/2017 0910  Last data filed at 11/22/2017 0600  Gross per 24 hour   Intake 745 ml   Output 1250 ml   Net -505 ml     Last Bowel Movement: 11/20/17     General: Well-nourished, well-developed, in NAD.  HEENT: NC/AT, EOMI, Oropharynx clear, mucous membranes moist, Trachea midline.  Cardiovascular:  Systolic murmur; no rubs, gallops.  Respiratory: CTABL  Abdominal: Bowel sounds normal; abdomen soft, non-tender to palpation  Extremities/Skin:  Significant lower extremity right greater than left edema. Warm and dry.  Neurological: Awake, A&O x 3.     LABS:    CBC Differential   Recent Labs     11/20/17  0421 11/21/17  0537 11/22/17  0525   WBC 8.7 9.1 8.3   HGB 10.6* 11.2* 10.9*   HCT 31.9* 33.4* 33.0*   PLTCNT 204 242 239    Recent Labs     11/20/17  0421 11/21/17  0537 11/22/17  0525   PMNS 81 77 76   LYMPHOCYTES 6 7 9    MONOCYTES 12 14 13    EOSINOPHIL 1 1 1    BASOPHILS 1  0.06 1  0.07 1  0.07   PMNABS 7.01 7.00 6.34   LYMPHSABS 0.51* 0.67* 0.72*   MONOSABS 1.03* 1.23* 1.11*   EOSABS 0.08 0.11 0.11      BMP LFTs   Recent Labs     11/22/17  0525   SODIUM 135*   POTASSIUM 4.4      CHLORIDE 96   CO2 31   BUN 30*   CREATININE 1.15   GLUCOSENF 94   ANIONGAP 8   BUNCRRATIO 26*   GFR >59   CALCIUM 8.9   MAGNESIUM 2.2   PHOSPHORUS 2.7    No results found for this encounter   CoAgs Blood Gas:   Recent Labs     11/22/17  0525   PROTHROMTME 30.1*   INR 2.62*    No results found for this encounter    Cardiac Markers Lipid Panel   No results for input(s): TROPONINI, CKMB, MBINDEX, BNP in the last 72 hours. No results found for this encounter     IMAGING STUDIES:         PATHOLOGY & MICROBIOLOGY:    No results found for any visits on 11/11/17 (from the past 24 hour(s)).    INPATIENT MEDICATIONS:    Current Facility-Administered Medications:  acetaminophen (TYLENOL) tablet 650 mg Oral Q4H PRN   aspirin chewable tablet 81 mg  81 mg Oral Daily   atorvastatin (LIPITOR) tablet 80 mg Oral QPM   digoxin (LANOXIN) tablet 125 mcg Oral Daily   docusate sodium (COLACE) capsule 100 mg Oral 2x/day   insulin NPH human 100 units/mL injection 10 Units Subcutaneous 2x/day AC   insulin R human 100 units/mL injection 2 Units Subcutaneous 3x/day AC   lidocaine-menthol (LIDOPATCH) 3.6%-1.25% patch 1 Patch Transdermal Daily   magnesium oxide (MAG-OX) tablet 400 mg Oral 2x/day   NS flush syringe 2 mL Intracatheter Q8HRS   And      NS flush syringe 2-6 mL Intracatheter Q1 MIN PRN   nystatin (MYCOSTATIN) 100,000 units per mL oral liquid 5 mL Swish & Spit 4x/day   nystatin (NYSTOP) 100,000 units/g topical powder  Apply Topically 2x/day   ondansetron (ZOFRAN) 2 mg/mL injection 4 mg Intravenous Q8H PRN   phenol (CHLORASEPTIC) 1.4% oromucosal spray 1 Spray Mouth/Throat Q4H PRN   polyethylene glycol (MIRALAX) oral packet 17 g Oral Daily   predniSONE (DELTASONE) tablet 2.5 mg Oral Daily with Breakfast   sacubitril-valsartan (ENTRESTO) 49-51 mg per tablet 1 Tab Oral 2x/day   sennosides-docusate sodium (SENOKOT-S) 8.6-50mg  per tablet 1 Tab Oral 2x/day   SSIP insulin R human (HUMULIN R) 100 units/mL injection 2-6 Units Subcutaneous  4x/day PRN   torsemide (DEMADEX) tablet 50 mg 50 mg Oral 2x/day   traMADol (ULTRAM) tablet 50 mg Oral Q6H PRN        ASSESSMENT:     CHF, uncontrolled DM 2, atrial fibrillation status post ablation on warfarin for anticoagulation, paced with AICD, CKD 3, gout, and RA who presented originally from [facility] for shock, now as a MICU transfer to the medicine service.    Active Hospital Problems    Diagnosis   . Primary Problem: Septic shock (CMS HCC)   . Hypokalemia   . UTI (urinary tract infection)        PLAN:     Acute on chronic CHF exacerbation  A-fib on warfarin- supratherapeutic INR   - TTE; EF 25% with multiple regions of marked akinesis/hypokinesis  - cardiology HF consulted; appreciate recs  - Cardiology blue following pt; restarted entresto; switched bumex to lasix 80 mg TID po, then 80 mg t.i.d. IV.  - lasix d/c on 3/26 switched to torsemide 50 mg bid   - will continue to monitor BMP q12 hr.  -aggressively replacing potassium 4 goal greater than 4. And Mag greater than 2.    - 1.25 L neck urine output yesterday.  Since admission, net -17 .6 L.   - strict I&Os    - Continue dig  - Holding beta blocker; per cards, can be restarted in outpatient setting  - patient was started on 1 mg warfarin overnight - INR decreased this morning to 2.62  - Aspirin and lipitor 80 mg  - lymphedema consult ordered; appreciate recs.    Back pain stable  - patient attributes lumbar back pain to discomfort in his bed  - not significantly relied by lidocaine patches   - tramadol 50 mg q6 hr prn    Bladder dome lesion:  - CT renal calculus protocol and cystogram reviewed  - urology consulted  - s/p uretheral stricture dilation  - no additional imaging requiring  - pt will need f/u cystoscopy outpatient; urology has referred (3 week follow-up; orders placed in epic)  - pt to be d/c with foley  - patient complains of feelings of bladder spams; patient on 2 day course of pyridium.  AKI on CKD III -resolved  - likely 2/2  septic shock; per report, baseline Cr ~1.6; was 3.6 at OSH  - urine lytes suggest pre-renal etioogy  - strict I/Os  -creatinine 1.41 at time of transfer.  -will continue to monitor with daily BMPs.    Shock, most likely combined septic/cardiogenic secondary to UTI/pyelonephritis versus lower extremity cellulitis -- resolved  - blood cultures, urine cultures no growth   -patient initially treated with linezolid, cefepime; de-escalated to Rocephin for total 7 day course on 11/13/2017, with stop date of 11/17/2017.   -Levophed discontinued on 11/14/2016  -Concern for bladder diverticula/uretheral stricture on initial presentation. Urology consulted; foley in place  -stress dose steroids weaned to home prednisone 2.5 mg  (patient takes for RA on outpatient basis)    Probable left psoas hematoma:  - noted on CT 3/18, asymptomatic  - no hx recent trauma; likely 2/2 supratherapeutic INR  -will monitor.    Lymphedema  lower extremity wounds:  - wound careand lymphedema OTconsulted; appreciate recs    DM2, uncontrolled:  - A1c 8.2  - hold home PO agents  - SSI  - Will initiate NPH 10 units BID and regular insulin to make it more affordable for the patient  - Plan to discharge patient on jardiance (care management contacted to help with coupons since cost is an issue for the patient)    Back pain  - patient complaining of back pain this morning, offered lidocaine patches.     Plan is for patient to go home with home health today       Chronic medical problems:  - CAD s/p CABG  HTN  HLD: hold coreg  - gout: hold allopurinol, colchicine  - BPH: hold tamsulosin  - Hypomagnesemia: hold PO supplement, replace prn  - [id number], holdleflunamide    ____________________________________________________________________    Prophylaxis:  - DVT: held d/t INR  - GI: Not indicated  Analgesia:None  Nutrition:Diabetic and Cardiac  Therapy: OT and PT  Bowel regimen:senna, colace      Joshua C. Lurline Hare MD, PhD    PGY1, Transitional Year Resident   Pager [phone]      I saw and examined the patient.  I reviewed the resident's note.  I agree with the findings and plan of care as documented in the resident's note.  Any exceptions/additions are edited/noted.    Lurene Shadow, MD

## 2017-11-22 NOTE — OT Treatment (Signed)
[facility]  Rehabilitation Services  Lymphedema Therapy Progress Note      Patient Name: Andrew Arias  Date of Birth: 03-Aug-1956  Height:  Height: 193 cm (6' 3.98")  Weight:  Weight: (!) 149.6 kg (329 lb 12.9 oz)  Room/Bed: 805/A       Date/Time of Admission: 11/11/2017  7:20 AM  Admitting Diagnosis: UTI (urinary tract infection) [N39.0]    Subjective:   "I didn't have any problems with the wrap."      Objective:   Pt seen at b/s for lymphedema therapy.  Pt OOB to chair- states he just lowered his LEs because the housekeeper needed to get by him to access trash and the bathroom.  Compression applied to the R distal LE yesterday intact and well tolerated.    Pain: c/o back spasms after LE elevated during my 40 min visit    Wound status: still heavy lymphatic drainage from the shin and medial calf striking through all dressings, stockinet and into foam padding layer but not into compression layer.  Heavy drainage from the L posterior medial foot (although the blistered area adjacent to this is now decongested), moderate drainage from the R posterior lateral foot and minimal drainage from lateral malleolus. No odor.    Admission weight: see eval     Current weight: Weight: (!) 149.6 kg (329 lb 12.9 oz)    Circumferential measurements taken to monitor progress:  Left deferred   Right foot 28.5cm, Y=39.8cm, ankle 32.5cm, widest calf 48.5cm    Education: detailed instruction provided to the pt regarding basic wound care principles, ideal positioning of the R LE when elevated such that no pressure is on the lateral ankle or foot, emphasized the need to follow at wound center within 1 week of d/c due to significance of wounds on the foot with poor healing potential without continued skilled intervention.  Emphasized that if compression is continued with home nursing or family it must be used with padding (supplies provided), it must be kept light (tighter is NOT better), and it must be checked at least  daily due to sensory deficits.  Pt's family arrived as I was leaving and I re-instructed them.  When the pt's wife asked the pt if he was paying attention and "getting this" he replied "no" because he was focussed on the back spasms he was having.      Treatment today: wound care with wound cleanser and gauze 4x4's, Santyl to R lateral malleolus, Xeroform gauze to R shin and medial calf, and all open/weeping areas covered with Melgisorb Ag and covered with ABD pads.  Reapplied light compression using same technique as yesterday.  Hand washed and hung to dry foam padding which had lymph drainage on it.  Provided extra padding, compression and wound care supplies for 2-4 days depending on how drainage continues on the shin.      Assessment:   Volume still reducing in the calf.  Still heavy lymphatic drainage from the shin, medial calf and posterior medial foot. Pt will need continued skilled wound care intervention upon d/c and reports successful intervention at [facility] wound care center in the past.  I informed the pt and his family he should be seen within 1 week, and I called the care manager Danae Chen) to ask for assist in scheduling and if the pt requires a referral.  Pt and family were reminded several times that wound healing potential on the foot is poor.     **Pt/RN may  remove compression if poorly tolerated for any reason, or if soiled.  If compression would need to be removed for any reason- please page me to reapply. If I am not available to reapply compression, please leave compression off (Do not reapply) and ensure wounds (if any) are covered. Compression re-applied incorrectly may hinder the pt's progress and could result in worsening of the pt's condition.      Discharge Needs:   Pt d/c'ing home with home health nursing.  Should continue very light compression on the R LE which should be removed at least daily to inspect skin given sensory loss in the foot.  Continue wound care daily until drainage  reduces such that qod, etc is appropriate.  Gently wash wounds with soap and water, rinse and pat dry, or use wound cleanser and gauze 4x4s.  Apply Xeroform gauze to shin, apply Santyl ointment to R lateral ankle and cover all areas with Melgisorb Ag (or comparable) and cover these with ABD pads.  Apply stockinet (extras provided), padding (extras provided), and very light compression (extras provided) from base of toes to just below the knees.    Recommend to be seen at [facility] wound center within 1 week. (has followed there in the past, but not recently)    Plan:   Will continue to follow for lymphedema treatment 2-5x/week during hospital stay to facilitate edema reduction, optimal skin integrity and pt comfort.      Compression wrap(s) should remain on at all times, but should be removed if they become soiled, wet, slide down, or are poorly tolerated by the pt for any reason.  Will re-check Fri if d/c is delayed.    **Pt/RN may remove compression if poorly tolerated for any reason, or if soiled.  If compression would need to be removed for any reason- please page me to reapply. If I am not available to reapply compression, please leave compression off (Do not reapply) and ensure wounds (if any) are covered. Compression re-applied incorrectly may hinder the pt's progress and could result in worsening of the pt's condition.    The risks/benefits of therapy have been discussed with the patient/caregiver and he/she is in agreement with the established plan of care.     Therapist:   Gordy Clement, OTR/L, CLT  11/22/2017 08:47  Pager #:[phone]  Treatment Time: 40 minutes  Time may include review of medical chart, obtaining patient's functional history from patient/family/medical staff/case management/ancillary personnel, collaboration on findings and treatment options (with the above mentioned individuals), re-assessment, and acute care rehabilitation.

## 2017-11-22 NOTE — Pharmacy (Signed)
[facility] / [facility] of Pharm[facility] Services  Therapeutic Drug Monitoring: Warfarin Progress Note    Andrew Arias is a 62 y.o. year old male on warfarin for afib     Goal INR: 2-3    Home dose: 5 mg nightly per patient    Outpatient INR Management: PCP Dr. Janne Lab in [place], [place] [phone]    Inpatient Management: Medicine 1    Concurrent Antiplatelet Agents: aspirin    Inpatient Warfarin Dosing:     Date INR Warfarin Dose   (@ 21:00) Bridging Regimen Drug Interactions Comments   03/17 2.23 5 mg      03/18 2.96 Held      03/19 3.78 Held  leflunomide may increase INR    03/20 4.13 Held      03/21 4.03 Held      03/22 4.16 Held      03/23 3.85 Held      03/24 3.75 Held      03/25 3.38 Held      03/26 2.99 Held      03/27 2.74 1 mg      03/28 2.62 (1 mg)        Assessment/Plan:  [name] 's INR today is therapeutic    Pharmacist recommendation: agree with team to continue warfarin at much lower dose of 1 mg. Per patient cannot afford DOACs.  Continue to check INR daily.    Monitoring:  Please continue to monitor PT/INR daily.  Monitor for signs/symptoms of bleeding.   Pharmacist will continue to make daily warfarin recommendations.   Please contact pharmacy with any questions.

## 2017-11-22 NOTE — Nurses Notes (Signed)
Patient to be discharged to home under home health.  Discharge instructions completed with full understanding of patient and spouse.  PIV D/C intact.  Patient has all personal belongings.  Home Health agency not called per order from Case Management

## 2017-11-22 NOTE — Care Management Notes (Signed)
[facility]  Care Management Note    Patient Name: Andrew Arias  Date of Birth: 10-21-1955  Sex: male  Date/Time of Admission: 11/11/2017  7:20 AM  Room/Bed: 805/A  Payor: [place] / Plan: [facility] / Product Type: MEDICARE [facility] /    LOS: 11 days   Primary Care Providers:  Judyann Munson, MD, MD (General)  Doreatha Martin, MD, MD   (Cardiologist)    Admitting Diagnosis:  UTI (urinary tract infection) [N39.0]    Assessment:      11/22/17 1330   Assessment Details   Assessment Type Continued Assessment   Date of Care Management Update 11/22/17   Date of Next DCP Update 11/23/17   Care Management Plan   Discharge Planning Status plan in progress   Projected Discharge Date 11/22/17   Discharge Needs Assessment   Discharge Facility/Level of Care Needs Home with [facility] (code 6)       Discharge Plan:  Home with [place] (code 6)  MSW phoned [facility], per [place], they are able to accept pt with start of care for 11/23/17. Pt aware and in agreement. RN notified.     The patient will continue to be evaluated for developing discharge needs.     Case Manager: Theodis Aguas  Phone: [phone]

## 2017-11-22 NOTE — Care Plan (Signed)
Lymphedema Therapy      Assessment:   Volume still reducing in the calf.  Still heavy lymphatic drainage from the shin, medial calf and posterior medial foot. Pt will need continued skilled wound care intervention upon d/c and reports successful intervention at [facility] wound care center in the past.  I informed the pt and his family he should be seen within 1 week, and I called the care manager Danae Chen) to ask for assist in scheduling and if the pt requires a referral.  Pt and family were reminded several times that wound healing potential on the foot is poor.     **Pt/RN may remove compression if poorly tolerated for any reason, or if soiled.  If compression would need to be removed for any reason- please page me to reapply.  If I am not available to reapply compression, please leave compression off (Do not reapply) and ensure wounds (if any) are covered.  Compression re-applied incorrectly may hinder the pt's progress and could result in worsening of the pt's condition.      Discharge Needs:   Pt d/c'ing home with home health nursing.  Should continue very light compression on the R LE which should be removed at least daily to inspect skin given sensory loss in the foot.  Continue wound care daily until drainage reduces such that qod, etc is appropriate.  Gently wash wounds with soap and water, rinse and pat dry, or use wound cleanser and gauze 4x4s.  Apply Xeroform gauze to shin, apply Santyl ointment to R lateral ankle and cover all areas with Melgisorb Ag (or comparable) and cover these with ABD pads.  Apply stockinet (extras provided), padding (extras provided), and very light compression (extras provided) from base of toes to just below the knees.    Recommend to be seen at [facility] wound center within 1 week. (has followed there in the past, but not recently)    Plan:   Will continue to follow for lymphedema treatment 2-5x/week during hospital stay to facilitate edema reduction, optimal skin integrity  and pt comfort.      Compression wrap(s) should remain on at all times, but should be removed if they become soiled, wet, slide down, or are poorly tolerated by the pt for any reason.  Will re-check Fri if d/c is delayed.    **Pt/RN may remove compression if poorly tolerated for any reason, or if soiled.  If compression would need to be removed for any reason- please page me to reapply.  If I am not available to reapply compression, please leave compression off (Do not reapply) and ensure wounds (if any) are covered.  Compression re-applied incorrectly may hinder the pt's progress and could result in worsening of the pt's condition.    The risks/benefits of therapy have been discussed with the patient/caregiver and he/she is in agreement with the established plan of care.     Therapist:   Gordy Clement, OTR/L, CLT  11/22/2017 08:47  Pager #:[phone]

## 2017-12-10 ENCOUNTER — Ambulatory Visit: Payer: No Typology Code available for payment source | Attending: Urology | Admitting: Specialist

## 2017-12-10 ENCOUNTER — Encounter (INDEPENDENT_AMBULATORY_CARE_PROVIDER_SITE_OTHER): Payer: Self-pay

## 2017-12-10 VITALS — BP 84/53 | HR 72 | Temp 97.0°F | Ht 78.0 in | Wt 324.5 lb

## 2017-12-10 DIAGNOSIS — N99115 Postprocedural fossa navicularis urethral stricture: Secondary | ICD-10-CM | POA: Insufficient documentation

## 2017-12-10 DIAGNOSIS — R339 Retention of urine, unspecified: Secondary | ICD-10-CM | POA: Insufficient documentation

## 2017-12-10 DIAGNOSIS — Z466 Encounter for fitting and adjustment of urinary device: Secondary | ICD-10-CM | POA: Insufficient documentation

## 2017-12-10 NOTE — Nursing Note (Signed)
Patient presents today for a foley catheter change per Dr. Kellie Simmering.   of10cc clear fluid was removed from the indwelling 16  French  foley balloon and it was removed without difficulty.  Under sterile conditions a 16 French  foley catheter was replaced without any difficulty.  The balloon was filled with 10 ml of normal saline.      The patient tolerated the procedure well without any complications.    Loralie Champagne, LPN  8/46/6599, 35:70

## 2017-12-10 NOTE — Progress Notes (Signed)
NAME: Andrew Arias  AGE: 62 y.o.  DATE: 12/10/2017  SERVICE: Urology        Chief Complaint   Patient presents with   . Foley Change           S: This is a 61 y.o.  male here for follow up of urinary retention. He was seen at with sepsis and urinary retention.  Please see the consult for baseline details.  Previous CT scan suggested question urachal remnant versus bladder diverticulum.  CT cystogram described no contrast entering the complex fluid collection or soft tissue mass along the anterior right lateral bladder wall.  This area also abutted the small bowel.  It is of indeterminate etiology.  Short-term follow-up was recommended.  He was also noted to have a left psoas hematoma.  At the time of consult he underwent difficult Foley catheter placement.  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  He is currently scheduled for CMG, cysto on 01/28/2018.  He is here today primarily for Foley catheter change.  He continues to be in a deconditioned state.  He says that he is getting stronger but is not back to baseline yet.  He still has difficulty with ambulation.  He is tolerating the catheter okay but was hopeful that we would try to remove it.  He initially denied any significant baseline voiding symptoms although he later admitted to urinary frequency and urgency.    Past Medical History  Past Medical History:   Diagnosis Date   . A-fib (CMS HCC)     s/p ablation   . Anemia    . Anticoagulant long-term use     Warfarin   . Arthritis    . CAD (coronary artery disease)     s/p CABG   . Cardiac LV ejection fraction 21-30%    . Cardiomyopathy (CMS [place])     EF 25%   . Cellulitis of lower extremity    . CHF (congestive heart failure) (CMS HCC)    . CKD (chronic kidney disease), stage III (CMS HCC)    . Colonic polyp    . Constipation    . DM2 (diabetes mellitus, type 2) (CMS HCC)    . Edema     bilateral  lower extremity   . Gout    . History of cellulitis    . HLD (hyperlipidemia)    . HTN (hypertension)    . Morbid obesity (CMS [place])    . Neuropathy (CMS HCC)     bilateral feet   . Osteomyelitis (CMS [place]) 2015    right ankle and was treated with external fixator for 4 months followed by multiple debridements   . Pacemaker    . Urinary retention    . Use of cane as ambulatory aid     states due to his right ankle- also has a walker and wheelchair           Past Surgical History  Past Surgical History:   Procedure Laterality Date   . HX CARDIAC ABLATION     . HX COLONOSCOPY     . HX CORONARY ARTERY BYPASS GRAFT     . HX PACEMAKER DEFIBRILLATOR PLACEMENT           No Known Allergies      Outpatient Medications Prior to Visit:  allopurinol (ZYLOPRIM) 300 mg Oral Tablet Take 300 mg by mouth Once  a day   aspirin 81 mg Oral Tablet, Chewable Take 324 mg by mouth Once a day   colchicine 0.6 mg Oral Tablet Take 0.6 mg by mouth Every Monday, Wednesday and Friday   digoxin (LANOXIN) 125 mcg Oral Tablet Take 0.125 mg by mouth Once a day   empagliflozin (JARDIANCE) 10 mg Oral Tablet Take 1 Tab (10 mg total) by mouth Once a day for 30 days   gabapentin (NEURONTIN) 300 mg Oral Capsule Take 300 mg by mouth Three times a day Take 600mg  in the morning, followed by 300mg  in the afternoon, then 600mg  in the evening.   insulin NPH isoph U-100 human 100 unit/mL Subcutaneous Suspension 10 Units by Subcutaneous route Twice a day before meals for 30 days   isosorbide dinitrate (ISORDIL) 30 mg Oral Tablet Take 30 mg by mouth Once a day   Leflunomide (ARAVA) 20 mg Oral Tablet Take 20 mg by mouth Once a day   magnesium oxide (MAG-OX) 400 mg (241.3 mg magnesium) Oral Tablet Take 400 mg by mouth Once a day   metFORMIN (GLUCOPHAGE) 500 mg Oral Tablet Take 500 mg by mouth Twice daily with food   predniSONE (DELTASONE) 2.5 mg Oral Tablet Take 2.5 mg by mouth Once a day before lunch   sacubitril-valsartan (ENTRESTO) 49-51 mg Oral Tablet Take 1 Tab by  mouth Twice daily   simvastatin (ZOCOR) 20 mg Oral Tablet Take 20 mg by mouth Every evening   torsemide (DEMADEX) 10 mg Oral Tablet Take 5 Tabs (50 mg total) by mouth Twice daily for 30 days   warfarin (COUMADIN) 1 mg Oral Tablet Take 1 Tab (1 mg total) by mouth Every evening     No facility-administered medications prior to visit.     ROS:  Please see HPI.  All other systems were reviewed by me and are negative.                O:         PHYSICAL EXAM:    BP (!) 84/53 (Site: Left, Patient Position: Sitting, Cuff Size: Adult)   Pulse 72   Temp 36.1 C (97 F) (Thermal Scan)   Ht 1.981 m (6\' 6" )   Wt (!) 147.2 kg (324 lb 8.3 oz)   SpO2 92% Comment: room air  BMI 37.50 kg/m       General:  NAD.  Vital signs reviewed.   Skin:  Warm, dry, and without suspicious lesion.    HEENT:  Head is normocephalic.  Hearing adequate for conversation.   Pulmonary:  Respirations unlabored.  No audible wheezes.     Cardiovascular:  No JVD.   Significant lower extremity edema bilaterally.  MS:   He is here in a wheelchair.Marland Kitchen  No CVAT.  GI : The abdomen is soft, nontender and nondistended. No palpable hernia.  Abdomen is obese.      Labs:    11/22/2017-creatinine 1.15         Procedure:  Foley catheter was changed today.         Diagnostic Studies-  Please see above description of CT cystogram      A:   1. Urinary retention  2. Recent episode of sepsis  3.  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 Deconditioning      P:  As noted above his catheter was changed today.  I recommend he continue with chronic Foley until the time of his CMG /  cysto.   Long-term bladder management will depend on the results and his  overall status.    No orders of the defined types were placed in this encounter.          Patient seen independently co-signing physician not present in the clinic.       Dossie Arbour, APRN,FNP-BC  12/10/2017, 08:51   Dossie Arbour, APRN  Thurmon Fair, MD Staff.      [facility]  Department of Urology  [phone]  [phone]     The patient was evaluated independently in the clinic by the advanced practice professional. I was not present in the clinic and did not participate in the evaluation and management of this patient visit.    Raymond Gurney., MD, FACS  Associate Program Director, Urology  Associate Professor of Urology  [facility] Department of Urology

## 2017-12-11 ENCOUNTER — Encounter (INDEPENDENT_AMBULATORY_CARE_PROVIDER_SITE_OTHER): Payer: Self-pay

## 2018-01-10 ENCOUNTER — Ambulatory Visit: Payer: No Typology Code available for payment source | Attending: Urology | Admitting: Urology

## 2018-01-10 ENCOUNTER — Encounter (INDEPENDENT_AMBULATORY_CARE_PROVIDER_SITE_OTHER): Payer: Self-pay | Admitting: Urology

## 2018-01-10 VITALS — BP 110/60 | HR 72 | Temp 97.0°F | Ht 78.0 in | Wt 321.7 lb

## 2018-01-10 DIAGNOSIS — N35919 Unspecified urethral stricture, male, unspecified site: Secondary | ICD-10-CM | POA: Insufficient documentation

## 2018-01-10 DIAGNOSIS — Z960 Presence of urogenital implants: Secondary | ICD-10-CM | POA: Insufficient documentation

## 2018-01-10 DIAGNOSIS — R339 Retention of urine, unspecified: Secondary | ICD-10-CM | POA: Insufficient documentation

## 2018-01-10 NOTE — Progress Notes (Signed)
NAME: Andrew Arias  AGE: 62 y.o.  DATE: 01/10/2018  SERVICE: Urology        Chief Complaint   Patient presents with   . Other     foley cath exchange           S: This is a 62 y.o.  male here for follow up of urinary retention. He was seen at with sepsis and urinary retention.  Please see the consult for baseline details.  Previous CT scan suggested question urachal remnant versus bladder diverticulum.  CT cystogram described no contrast entering the complex fluid collection or soft tissue mass along the anterior right lateral bladder wall.  This area also abutted the small bowel.  It is of indeterminate etiology.  Short-term follow-up was recommended.  He was also noted to have a left psoas hematoma.  At the time of consult he underwent difficult Foley catheter placement.  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  He is currently scheduled for CMG, cysto on 01/28/2018.  He is here today primarily for Foley catheter change.  He continues to be in a deconditioned state.  He still has difficulty with ambulationAnd he currently walks with a cane.  He is tolerating the catheter okay but was hopeful that we would try to remove it.  He initially denied any significant baseline voiding symptoms although he later admitted to urinary frequency and urgency.  Endorses no changes in his condition since he was last seen.  He is anxious to have the urodynamic study to see if he can be without the catheter.    Past Medical History  Past Medical History:   Diagnosis Date   . A-fib (CMS HCC)     s/p ablation   . Anemia    . Anticoagulant long-term use     Warfarin   . Arthritis    . CAD (coronary artery disease)     s/p CABG   . Cardiac LV ejection fraction 21-30%    . Cardiomyopathy (CMS [place])     EF 25%   . Cellulitis of lower extremity    . CHF (congestive heart failure) (CMS HCC)    . CKD (chronic kidney disease),  stage III (CMS HCC)    . Colonic polyp    . Constipation    . DM2 (diabetes mellitus, type 2) (CMS HCC)    . Edema     bilateral lower extremity   . Gout    . History of cellulitis    . HLD (hyperlipidemia)    . HTN (hypertension)    . Morbid obesity (CMS [place])    . Neuropathy (CMS HCC)     bilateral feet   . Osteomyelitis (CMS [place]) 2015    right ankle and was treated with external fixator for 4 months followed by multiple debridements   . Pacemaker    . Urinary retention    . Use of cane as ambulatory aid     states due to his right ankle- also has a walker and wheelchair           Past Surgical History  Past Surgical History:   Procedure Laterality Date   . HX CARDIAC ABLATION     . HX COLONOSCOPY     . HX CORONARY ARTERY BYPASS GRAFT     . HX PACEMAKER DEFIBRILLATOR PLACEMENT  No Known Allergies      Outpatient Medications Prior to Visit:  allopurinol (ZYLOPRIM) 300 mg Oral Tablet Take 300 mg by mouth Once a day   aspirin 81 mg Oral Tablet, Chewable Take 324 mg by mouth Once a day   colchicine 0.6 mg Oral Tablet Take 0.6 mg by mouth Every Monday, Wednesday and Friday   digoxin (LANOXIN) 125 mcg Oral Tablet Take 0.125 mg by mouth Once a day   gabapentin (NEURONTIN) 300 mg Oral Capsule Take 300 mg by mouth Three times a day Take 600mg  in the morning, followed by 300mg  in the afternoon, then 600mg  in the evening.   isosorbide dinitrate (ISORDIL) 30 mg Oral Tablet Take 30 mg by mouth Once a day   Leflunomide (ARAVA) 20 mg Oral Tablet Take 20 mg by mouth Once a day   magnesium oxide (MAG-OX) 400 mg (241.3 mg magnesium) Oral Tablet Take 400 mg by mouth Once a day   metFORMIN (GLUCOPHAGE) 500 mg Oral Tablet Take 500 mg by mouth Twice daily with food   predniSONE (DELTASONE) 2.5 mg Oral Tablet Take 2.5 mg by mouth Once a day before lunch   sacubitril-valsartan (ENTRESTO) 49-51 mg Oral Tablet Take 1 Tab by mouth Twice daily   simvastatin (ZOCOR) 20 mg Oral Tablet Take 20 mg by mouth Every evening   warfarin  (COUMADIN) 1 mg Oral Tablet Take 1 Tab (1 mg total) by mouth Every evening     No facility-administered medications prior to visit.     ROS:  Please see HPI.  All other systems were reviewed by me and are negative.                O:         PHYSICAL EXAM:    BP 110/60   Pulse 72   Temp 36.1 C (97 F) (Thermal Scan)   Ht 1.981 m (6\' 6" )   Wt (!) 145.9 kg (321 lb 10.4 oz)   SpO2 96% Comment: room air  BMI 37.17 kg/m       General:  NAD.  Vital signs reviewed.   Skin:  Warm, dry, and without suspicious lesion.    HEENT:  Head is normocephalic.  Hearing adequate for conversation.   Pulmonary:  Respirations unlabored.  No audible wheezes.     Cardiovascular:  No JVD.   Significant lower extremity edema bilaterally.  MS:   He is here in a wheelchair.Marland Kitchen  No CVAT.  GI : The abdomen is soft, nontender and nondistended. No palpable hernia.  Abdomen is obese.      Labs:    11/22/2017-creatinine 1.15         Procedure:  Foley catheter was changed today.  Sixteen [place] silicone.  See separate nursing procedure note         Diagnostic Studies-   No new diagnostic studies      A:     1. Urinary retention managed with indwelling Foley  2. Recent episode of sepsis  3. Stricture of the fossa navicularis status post dilatation to 18 [place] with [facility] dilators  4. Deconditioning      P:  As noted above his catheter was changed today.  I recommend he continue with chronic Foley until the time of his CMG / cysto.   Long-term bladder management will depend on the results and his  overall status.    RTC for cysto CMG as scheduled    Merian Capron, MD 01/10/2018, 10:50  Department  of Urology - PGY 4  [place]  [phone]      I saw and examined the patient.  I reviewed the resident's note.  I agree with the findings and plan of care as documented in the resident's note.  Any exceptions/additions are edited/noted.    Thurmon Fair, MD  Thurmon Fair, Brooke Bonito., MD, [place]  Associate Program Director, Urology  Associate Professor of Urology  [facility]  Department of Urology

## 2018-01-10 NOTE — Nursing Note (Signed)
Patient presents today for a foley catheter change per Dr. Matthew Folks.  10 ml of clear fluid was removed from the indwelling 16 French 10 foley balloon and it was removed without difficulty.  Under sterile conditions a 16 French  foley catheter was replaced without any difficulty.  The balloon was filled with 10 ml of normal saline.      The patient tolerated the procedure well without any complications.      Dwan Bolt, RN  01/10/2018, 11:26

## 2018-01-28 ENCOUNTER — Ambulatory Visit (INDEPENDENT_AMBULATORY_CARE_PROVIDER_SITE_OTHER): Payer: Self-pay | Admitting: Urology

## 2018-01-28 NOTE — Nursing Note (Signed)
Message from Golden Pop sent at 01/28/2018 3:40 PM EDT     Patient needs a call regarding his catheter plan until he has his Cysto/CMG with Dr. Matthew Folks on 03/15/18. The patient was supposed to have procedure completed on 01/28/18 but end up canceling because he was just discharged from an outside facility with a bladder/kidney infection that resulted in septic shock. He is on a two week course of Cipro. Dr. Matthew Folks recommended the patient come back six weeks post 01/25/18 for his Cysto/CMG. Patient verbalized understanding of this wait for procedure.          I attempted to contact the patient regarding the above.  He will need to have the catheter exchanged 4 weeks after last placement.  He can have this done at his PCP office or he can make an appointment to have it done in the clinic.  A voice mail message was left requesting return call to Urology Office as there was no answer.     Roselyn Meier, RN  01/28/2018, 16:08

## 2018-02-07 ENCOUNTER — Encounter (INDEPENDENT_AMBULATORY_CARE_PROVIDER_SITE_OTHER): Payer: No Typology Code available for payment source | Admitting: Urology

## 2018-02-18 ENCOUNTER — Ambulatory Visit (INDEPENDENT_AMBULATORY_CARE_PROVIDER_SITE_OTHER): Payer: Self-pay | Admitting: Urology

## 2018-02-18 NOTE — Nursing Note (Signed)
Message from Venia Minks sent at 02/18/2018 9:02 AM EDT     Summary: question    Andrew Arias    Arias hs a procedure scheduled on 7.17.19. He is on antibiotics for an infection currently. He will finish them on Thursday morning. He didn't know if he can still have this on the 17th or not. Please advise.    Thank you                  I spoke with the patient regarding the above.  He states he is currently taking an antibiotic for a UTI.  The patient was informed his procedure will not be cancelled due to his current course of antibiotics but may be cancelled if he has an active infection at the time of his procedure.  The patient was informed he will always show bacteria in his urine since he has an indwelling catheter but he is to present to the ER if he develops fever, chills, or weakness (present at last UTI).  The patient verbalized understanding of all information provided and offers no questions at this time.     Roselyn Meier, RN  02/18/2018, 09:29

## 2018-03-12 ENCOUNTER — Ambulatory Visit (INDEPENDENT_AMBULATORY_CARE_PROVIDER_SITE_OTHER): Payer: Self-pay | Admitting: Urology

## 2018-03-12 ENCOUNTER — Telehealth (INDEPENDENT_AMBULATORY_CARE_PROVIDER_SITE_OTHER): Payer: Self-pay | Admitting: Urology

## 2018-03-12 NOTE — Nursing Note (Signed)
Message from Golden Pop sent at 03/12/2018 3:34 PM EDT     I had to contact the patient today to get his 03/15/18 procedure for Cysto/CMG with Dr. Matthew Folks rescheduled because of the OR Equipment being down. The patient was rescheduled to the date of 05/07/18. The patient is wondering about his catheter management between now and then. He said if he could not be reached on his home number to try his cell and leave a message on either line if need be.    Call History      Type Contact Phone User   03/12/2018 03:30 PM Phone (Incoming) Jahaan, Vanwagner (Self) [phone] Lemmie Evens) Golden Pop         I attempted to contact the patient to inform him he will need to have the catheter exchanged every 4-6 weeks.  He can be scheduled in the clinic as a Urology Special or he can go to his PCP office for an exchange.  A voice mail message was left requesting return call to Urology Office as there was no answer.     Roselyn Meier, RN  03/12/2018, 17:16

## 2018-03-12 NOTE — Telephone Encounter (Signed)
The patient had to reschedule his 03/15/18 Cysto/CMG procedure that he had set with Dr. Matthew Folks. The OR notified us on 03/12/18 that the [facility] is down and that they are waiting on parts. The patient was rescheduled to 05/07/18 with Dr. Matthew Folks for procedure. The patient was very understanding regarding the situation.

## 2018-03-12 NOTE — Telephone Encounter (Signed)
I attempted to contact the patient to let him know that his procedure for Cysto/CMG with Dr. Matthew Folks that was set for 03/15/18 has to be rescheduled because of the OR equipment going down. The OR notified us on 03/12/18 to let us know this. I was not able to reach the patient and was only able to leave him a voice message with my direct number to call back to reschedule.

## 2018-03-19 ENCOUNTER — Inpatient Hospital Stay (HOSPITAL_COMMUNITY)
Admission: EM | Admit: 2018-03-19 | Discharge: 2018-03-19 | Disposition: A | Discharge status: Home / Self Care | Payer: Self-pay

## 2018-03-19 DIAGNOSIS — R0602 Shortness of breath: Secondary | ICD-10-CM

## 2018-03-19 DIAGNOSIS — I959 Hypotension, unspecified: Secondary | ICD-10-CM

## 2018-03-19 DIAGNOSIS — I509 Heart failure, unspecified: Secondary | ICD-10-CM

## 2018-03-19 DIAGNOSIS — R0902 Hypoxemia: Secondary | ICD-10-CM

## 2018-03-20 DIAGNOSIS — R197 Diarrhea, unspecified: Secondary | ICD-10-CM

## 2018-03-20 DIAGNOSIS — E669 Obesity, unspecified: Secondary | ICD-10-CM

## 2018-03-20 DIAGNOSIS — W19XXXA Unspecified fall, initial encounter: Secondary | ICD-10-CM

## 2018-03-20 DIAGNOSIS — R0602 Shortness of breath: Secondary | ICD-10-CM

## 2018-03-21 DIAGNOSIS — N179 Acute kidney failure, unspecified: Secondary | ICD-10-CM

## 2018-03-21 DIAGNOSIS — I4891 Unspecified atrial fibrillation: Secondary | ICD-10-CM

## 2018-03-21 DIAGNOSIS — R531 Weakness: Secondary | ICD-10-CM

## 2018-03-26 ENCOUNTER — Encounter (INDEPENDENT_AMBULATORY_CARE_PROVIDER_SITE_OTHER): Payer: Self-pay

## 2018-04-03 ENCOUNTER — Ambulatory Visit (INDEPENDENT_AMBULATORY_CARE_PROVIDER_SITE_OTHER): Payer: Self-pay | Admitting: Urology

## 2018-04-03 NOTE — Nursing Note (Signed)
Message from Jannetta Quint sent at 04/03/2018 10:54 AM EDT     Tiffany with [facility] needs to speak with nurse regarding chronic foley. Please call them to discuss. Thank you.    Call History      Type Contact Phone User   04/03/2018 10:51 AM Phone (Incoming) [facility] [phone] [place], [place] D         I spoke with Tiffany regarding the above. She states the patient is currently an inpatient in her facility and is informing the staff that his catheter is not to be removed. Tiffany was informed the catheter was to be exchanged every 4-6 weeks until his cystoscopy on 05/07/2018.  She was also informed we have attempted to contact the patient to instruct him to have the catheter exchanged but have been unsuccessful. The last documented exchange was on 01/10/2018 in the clinic. Tiffany was instructed to exchange the catheter and to consult their Urology group if needed. Tiffany verbalized understanding of all information provided and offers no questions at this time.     Roselyn Meier, RN  04/03/2018, 11:10

## 2018-04-17 ENCOUNTER — Inpatient Hospital Stay (HOSPITAL_COMMUNITY)
Admission: EM | Admit: 2018-04-17 | Discharge: 2018-04-17 | Disposition: A | Discharge status: Home / Self Care | Payer: Self-pay | Source: Other Acute Inpatient Hospital

## 2018-04-17 DIAGNOSIS — I2510 Atherosclerotic heart disease of native coronary artery without angina pectoris: Secondary | ICD-10-CM

## 2018-04-17 DIAGNOSIS — I5023 Acute on chronic systolic (congestive) heart failure: Secondary | ICD-10-CM

## 2018-04-17 DIAGNOSIS — R601 Generalized edema: Secondary | ICD-10-CM

## 2018-04-17 DIAGNOSIS — I4891 Unspecified atrial fibrillation: Secondary | ICD-10-CM

## 2018-04-17 DIAGNOSIS — I509 Heart failure, unspecified: Secondary | ICD-10-CM

## 2018-04-17 DIAGNOSIS — E11649 Type 2 diabetes mellitus with hypoglycemia without coma: Secondary | ICD-10-CM

## 2018-04-17 DIAGNOSIS — R0602 Shortness of breath: Secondary | ICD-10-CM

## 2018-05-06 ENCOUNTER — Ambulatory Visit
Admission: RE | Admit: 2018-05-06 | Discharge: 2018-05-06 | Disposition: A | Discharge status: Home / Self Care | Payer: No Typology Code available for payment source | Source: Ambulatory Visit

## 2018-05-07 ENCOUNTER — Ambulatory Visit (HOSPITAL_BASED_OUTPATIENT_CLINIC_OR_DEPARTMENT_OTHER): Payer: No Typology Code available for payment source | Admitting: Urology

## 2018-05-07 ENCOUNTER — Encounter (HOSPITAL_COMMUNITY): Payer: Self-pay

## 2018-05-07 ENCOUNTER — Ambulatory Visit
Admission: RE | Admit: 2018-05-07 | Discharge: 2018-05-07 | Disposition: A | Discharge status: Home / Self Care | Payer: No Typology Code available for payment source | Source: Ambulatory Visit | Attending: Urology | Admitting: Urology

## 2018-05-07 ENCOUNTER — Encounter (HOSPITAL_COMMUNITY)
Admission: RE | Disposition: A | Discharge status: Home / Self Care | Payer: Self-pay | Source: Ambulatory Visit | Attending: Urology

## 2018-05-07 DIAGNOSIS — Z79899 Other long term (current) drug therapy: Secondary | ICD-10-CM | POA: Insufficient documentation

## 2018-05-07 DIAGNOSIS — I130 Hypertensive heart and chronic kidney disease with heart failure and stage 1 through stage 4 chronic kidney disease, or unspecified chronic kidney disease: Secondary | ICD-10-CM | POA: Insufficient documentation

## 2018-05-07 DIAGNOSIS — Z87440 Personal history of urinary (tract) infections: Secondary | ICD-10-CM

## 2018-05-07 DIAGNOSIS — Z7982 Long term (current) use of aspirin: Secondary | ICD-10-CM | POA: Insufficient documentation

## 2018-05-07 DIAGNOSIS — I429 Cardiomyopathy, unspecified: Secondary | ICD-10-CM | POA: Insufficient documentation

## 2018-05-07 DIAGNOSIS — E1122 Type 2 diabetes mellitus with diabetic chronic kidney disease: Secondary | ICD-10-CM | POA: Insufficient documentation

## 2018-05-07 DIAGNOSIS — Z951 Presence of aortocoronary bypass graft: Secondary | ICD-10-CM | POA: Insufficient documentation

## 2018-05-07 DIAGNOSIS — N99115 Postprocedural fossa navicularis urethral stricture: Secondary | ICD-10-CM

## 2018-05-07 DIAGNOSIS — Z95810 Presence of automatic (implantable) cardiac defibrillator: Secondary | ICD-10-CM | POA: Insufficient documentation

## 2018-05-07 DIAGNOSIS — I2510 Atherosclerotic heart disease of native coronary artery without angina pectoris: Secondary | ICD-10-CM | POA: Insufficient documentation

## 2018-05-07 DIAGNOSIS — R338 Other retention of urine: Secondary | ICD-10-CM

## 2018-05-07 DIAGNOSIS — Z6841 Body Mass Index (BMI) 40.0 and over, adult: Secondary | ICD-10-CM | POA: Insufficient documentation

## 2018-05-07 DIAGNOSIS — Z7984 Long term (current) use of oral hypoglycemic drugs: Secondary | ICD-10-CM | POA: Insufficient documentation

## 2018-05-07 DIAGNOSIS — N401 Enlarged prostate with lower urinary tract symptoms: Secondary | ICD-10-CM

## 2018-05-07 DIAGNOSIS — Z7901 Long term (current) use of anticoagulants: Secondary | ICD-10-CM | POA: Insufficient documentation

## 2018-05-07 DIAGNOSIS — E1140 Type 2 diabetes mellitus with diabetic neuropathy, unspecified: Secondary | ICD-10-CM | POA: Insufficient documentation

## 2018-05-07 DIAGNOSIS — N183 Chronic kidney disease, stage 3 (moderate): Secondary | ICD-10-CM | POA: Insufficient documentation

## 2018-05-07 DIAGNOSIS — I509 Heart failure, unspecified: Secondary | ICD-10-CM | POA: Insufficient documentation

## 2018-05-07 DIAGNOSIS — Z87891 Personal history of nicotine dependence: Secondary | ICD-10-CM | POA: Insufficient documentation

## 2018-05-07 DIAGNOSIS — I4891 Unspecified atrial fibrillation: Secondary | ICD-10-CM | POA: Insufficient documentation

## 2018-05-07 LAB — POC BLOOD GLUCOSE (RESULTS)
GLUCOSE, POC: 119 mg/dL — ABNORMAL HIGH (ref 70–105)
GLUCOSE, POC: 120 mg/dL — ABNORMAL HIGH (ref 70–105)

## 2018-05-07 SURGERY — CYSTOSCOPY WITH CYSTOMETROGRAM AND URODYNAMICS
Anesthesia: Local (Nurse-Monitored) | Site: Urethra | Wound class: Clean Contaminated Wounds-The respiratory, GI, Genital, or urinary

## 2018-05-07 MED ORDER — NITROFURANTOIN MONOHYDRATE/MACROCRYSTALS 100 MG CAPSULE
100.00 mg | ORAL_CAPSULE | Freq: Two times a day (BID) | ORAL | 0 refills | Status: AC
Start: 2018-05-07 — End: 2018-05-10

## 2018-05-07 MED ORDER — LIDOCAINE 2 % MUCOSAL JELLY IN APPLICATOR
10.0000 mL | Freq: Once | Status: DC | PRN
Start: 2018-05-07 — End: 2018-05-07
  Administered 2018-05-07 (×2): 10 mL via URETHRAL

## 2018-05-07 MED ORDER — SULFAMETHOXAZOLE 800 MG-TRIMETHOPRIM 160 MG TABLET
1.0000 | ORAL_TABLET | Freq: Once | ORAL | Status: AC
Start: 2018-05-07 — End: 2018-05-07
  Administered 2018-05-07 (×2): 160 mg via ORAL
  Filled 2018-05-07: qty 1

## 2018-05-07 SURGICAL SUPPLY — 41 items
BASIN MED GRAD 32OZ BLU STRL (MISCELLANEOUS PT CARE ITEMS)
CATH URETH DOVER 16FR 16IN FL_2W REINF TIP BAL SIL NPOR 5ML (UROLOGICAL SUPPLIES) ×1
CATH URETH DOVER 16FR 16IN FOLEY 2W REINF TIP BAL SIL NPOR 5CC STRL LF (UROLOGICAL SUPPLIES) ×2
CATH URETH DOVER RBNL 12FR 16IN 2 STGR DRAIN EYE RND CLS TIP INT FNL CONN PVC STRL LF  DISP (UROLOGICAL SUPPLIES) IMPLANT
CATH URETH DV RBNL 12FR 16IN 2_STGR DRAIN EYE RND CLS TIP (UROLOGICAL SUPPLIES)
CATH URODY 7FR VAG RCTL ABD (UROLOGICAL SUPPLIES) ×1
CATH URODY 7FR VAGINAL RECTAL ABD (UROLOGICAL SUPPLIES) ×2 IMPLANT
CATH URODY AIR-CHRG 7FR 19.7IN SENSOR LUM BLADDER SHAFT URETHRA PE DISP (UROLOGICAL SUPPLIES) ×2 IMPLANT
CATH URODY AIR-CHRG 7FR 19.7IN_1 SNSR LUM BLA SHFT URETHRA (UROLOGICAL SUPPLIES) ×2
CATH URODY TDOC AIR-CHRG 7FR COUDE SENSOR INFUS PORT BAL PRESS BLADDER DISP (VASCULAR) IMPLANT
CATH URODY TDOC AIR-CHRG 7FR C_DE SNSR INFS PORT BAL PRESS (VASCULAR)
COLLECTOR MSRT 32OZ SLANT PNT_GRAD NONST WHT SPECI (PATU)
COLLECTOR MSRT SLANT GRAD C32OZ NONST WHT SPECI (PATU) IMPLANT
CONTAINR POLYPROP 32OZ PRNT GRAD FROST PNL UNBRK SPECI 3ANG (Cautery Accessories) ×2 IMPLANT
CONV USE ITEM 328276 - ELECTRODE TAPE PREATTACH LEADWIRE PED DDRGL EKG NONST LF  NTRD2 CLR DISP (Electrical Supplies) ×2 IMPLANT
CONV USE ITEM 337909 - PACK SURG MIN CYSTO STRL DISP LTX (CUSTOM TRAYS & PACK) ×2 IMPLANT
CYL GRAD POLYPROP 32OZ FROST P_NL TRANSLUC 3ANG (Cautery Accessories) ×1
DEVICE SECURE STATLK FOLEY ANCH PAD STAB TRCT SIL CATH ADULT STRL LF  DISP (UROLOGICAL SUPPLIES) ×2 IMPLANT
DEVICE SECURE STATLK FOLEY ANC_H PAD STAB TRCT SIL CATH ADLT (UROLOGICAL SUPPLIES) ×1
DISC USE 162466 - BAG DRAIN UROLOGY 2000ML LF_154003 20EA/CS (UROLOGICAL SUPPLIES) ×2 IMPLANT
DISC USE 162466 BAG DRAIN UROLOGY 2000ML LF_154003 20EA/CS (UROLOGICAL SUPPLIES) ×1
DISC USE ITEM 163322 - SYRINGE LL 20ML LTX STRL MED (NEEDLES & SYRINGE SUPPLIES) ×2 IMPLANT
DISCONTINUED USE 16871 - CATH URETH DOVER 16FR 16IN FOLEY 2W REINF TIP BAL SIL NPOR 5CC STRL LF (UROLOGICAL SUPPLIES) ×2 IMPLANT
DISCONTINUED USE 338708 - BASIN MED GRAD 32OZ BLU STRL (MISCELLANEOUS PT CARE ITEMS)
DISCONTINUED USE 340506 - BASIN MED GRAD 32OZ BLU STRL (MISCELLANEOUS PT CARE ITEMS) IMPLANT
DISCONTINUED USE ITEM 328361 - JELLY LUB EZ BCTRST H2O SOL NG_RS FLPTP TUBE STRL 2OZ LF (MISCELLANEOUS PT CARE ITEMS) ×2 IMPLANT
ELECTRODE TAPE PREATTACH LEADW_IRE PED DDRGL EKG NONST LF (Electrical Supplies) ×2
JELLY LUB EZ BCTRST H2O SOL NG_RS FLPTP TUBE STRL 2OZ LF (MISCELLANEOUS PT CARE ITEMS) ×3
PACK CYSTO MINOR (CUSTOM TRAYS & PACK) ×1
PACK SURG MIN CYSTO STRL DISP LTX (CUSTOM TRAYS & PACK) ×2
SET 81IN REG CLAMP N-PYRG IRRG 10 GTT/ML STR CSCP DEHP BLADDER STRL LF (IV TUBING & ACCESSORIES) ×2 IMPLANT
SET 81IN REG CLAMP N-PYRG IRRG_10 GTT/ML STR CSCP DEHP (IV TUBING & ACCESSORIES) ×1
SET TUBING W/INFUSION LINE_TUB500 BX/25 (Drains/Resovoirs)
SOL IV 0.9% NACL PVC 1000ML PH 5 PLASTIC CONTAINR PRSV FR VIAFLEX LF (SOLUTIONS) ×4 IMPLANT
SOLUTION IV NS 1000CC 2B1324X_14/CS (SOLUTIONS) ×2
SPONGE GAUZE STRL 4 X 4IN TUB_6939 1280/CS (WOUND CARE SUPPLY) ×4 IMPLANT
SPONGE GAUZE STRL 4 X 4IN TUB_6939 1280/CS (WOUND CARE/ENTEROSTOMAL SUPPLY) ×2
SYRINGE LL 10ML LF  STRL GRAD N-PYRG DEHP-FR PVC FREE MED DISP (NEEDLES & SYRINGE SUPPLIES) ×2 IMPLANT
SYRINGE LL 10ML LF STRL MED D_ISP (NEEDLES & SYRINGE SUPPLIES) ×1
SYRINGE LL 20ML LTX STRL MED (NEEDLES & SYRINGE SUPPLIES) ×1
TUBING URODY 400CM SIL PVC PUMP INFUS LINE STRL LF (Drains/Resovoirs) IMPLANT

## 2018-05-07 NOTE — Nurses Notes (Signed)
Pt. Discharged to home via wheelchair with wife escorted by Skipper Cliche, PCA.

## 2018-05-07 NOTE — Discharge Summary (Signed)
[place]  Department of Surgery  Discharge Day Note    Andrew Arias  Date of service: 05/07/2018  Date of Admission:  05/07/2018  Hospital Day:  LOS: 0 days     Examination at discharge:  Vital Signs: Temperature: 36 C (96.8 F) (05/07/18 0700)  Heart Rate: 74 (05/07/18 0700)  BP (Non-Invasive): 111/62 (05/07/18 0700)  Respiratory Rate: 16 (05/07/18 0700)  SpO2: 96 % (05/07/18 0700)  General: No distress    Assessment:   62 y.o. male s/p   1. Attempted Cystometrogram    2. Flexible cystourethroscopy     Disposition/Plan :   -Home discharge    -Follow up in one month for foley exchange    Carlena Hurl, MD  05/07/2018, 11:52    ATTENDING POST-OP NOTE    Patient did well post-procedure.   VS stable, patient feeling well without complaints.  Condition: Stable  Disposition: Discharge to home today.     Thurmon Fair, MD  05/07/2018, 14:31  Thurmon Fair, Brooke Bonito., MD, FACS  Associate Residency Program Director  Associate Professor of Urology  [facility] Department of Urology

## 2018-05-07 NOTE — Nurses Notes (Signed)
After Visit Summary (AVS) and education hand-outs were given to pt. And pt. wife after instructional information was reviewed.  Pt and pt. wife verbalized understanding and both able to correctly repeat discharge instructions.

## 2018-05-07 NOTE — Discharge Instructions (Signed)
SURGICAL DISCHARGE INSTRUCTIONS     Dr. Thurmon Fair, MD  performed your CYSTOSCOPY WITH CYSTOMETROGRAM AND URODYNAMICS today at the [place] [facility]:  Monday through Friday from 6 a.m. - 7 p.m.: [phone]  Between 7 p.m. - 6 a.m., weekends and holidays:  Call [facility] at [phone] or [phone].    PLEASE SEE WRITTEN HANDOUTS AS DISCUSSED BY YOUR NURSE:  cystoscopy    SIGNS AND SYMPTOMS OF A WOUND / INCISION INFECTION   Be sure to watch for the following:   Increase in redness or red streaks near or around the wound or incision.   Increase in pain that is intense or severe and cannot be relieved by the pain medication that your doctor has given you.   Increase in swelling that cannot be relieved by elevation of a body part, or by applying ice, if permitted.   Increase in drainage, or if yellow / green in color and smells bad. This could be on a dressing or a cast.   Increase in fever for longer than 24 hours, or an increase that is higher than 101 degrees Fahrenheit (normal body temperature is 98 degrees Fahrenheit). The incision may feel warm to the touch.    **CALL YOUR DOCTOR IF ONE OR MORE OF THESE SIGNS / SYMPTOMS SHOULD OCCUR.    ANESTHESIA INFORMATION   LOCAL ANESTHETIC:  You have receieved a local anesthetic, the effects should disappear in a few hours.    REMEMBER   If you experience any difficulty breathing, chest pain, bleeding that you feel is excessive, persistent nausea or vomiting or for any other concerns:  Call your physician Dr. Matthew Folks at [phone] or [phone]. You may also ask to have the cystoscopy doctor on call paged. They are available to you 24 hours a day.    SPECIAL INSTRUCTIONS / COMMENTS       FOLLOW-UP APPOINTMENTS   Please call patient services at [phone] or [phone] to schedule a date / time of return. They are open Monday - Friday from 7:30 am - 5:00 pm.

## 2018-05-07 NOTE — OR Nursing (Signed)
1032-Foley catheter Fr 20 connected to urine bag draining to yellowish urine was removed by Rolanda Jay RN  1139-Cystoscopy was done, patient's perineum was cleaned from Betadine  1145-Dr. Fooks instructed to insert another foley catheter, Dr. Elise Benne informed  1151-Dr. Elise Benne inserted Fr. 16 silicone foley catheter connected to urine bag draining to yellowish urine, intact and secured

## 2018-05-07 NOTE — H&P (Signed)
[facility]  Department of Urologic Surgery   Pre-operative History and Physical    Andrew Arias  [id number]  [birth year]    CC:   1. Urinary retention managed with indwelling Foley  2. History of UTI  3. 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 Deconditioning    HPI: Andrew Arias is a 62 y.o. male with a history of urinary retention and indwelling foley who presents for cystoscopy and urodynamics. The patient was last seen in clinic on 01/10/18. No new issues.    PMHx:   Past Medical History:   Diagnosis Date   . A-fib (CMS HCC)     s/p ablation   . Anemia    . Anticoagulant long-term use     Warfarin   . Arthritis    . CAD (coronary artery disease)     s/p CABG   . Cardiac LV ejection fraction 21-30%    . Cardiomyopathy (CMS [place])     EF 25%   . Cellulitis of lower extremity    . CHF (congestive heart failure) (CMS HCC)    . CKD (chronic kidney disease), stage III (CMS HCC)    . Colonic polyp    . Constipation    . DM2 (diabetes mellitus, type 2) (CMS HCC)    . Edema     bilateral lower extremity   . Gout    . History of cellulitis    . HLD (hyperlipidemia)    . HTN (hypertension)    . Morbid obesity (CMS [place])    . Neuropathy (CMS HCC)     bilateral feet   . Osteomyelitis (CMS [place]) 2015    right ankle and was treated with external fixator for 4 months followed by multiple debridements   . Pacemaker    . Urinary retention    . Use of cane as ambulatory aid     states due to his right ankle- also has a walker and wheelchair          PSHx:   Past Surgical History:   Procedure Laterality Date   . HX CARDIAC ABLATION     . HX COLONOSCOPY     . HX CORONARY ARTERY BYPASS GRAFT     . HX PACEMAKER DEFIBRILLATOR PLACEMENT           Family Hx: Marland Kitchen  Family Medical History:     Problem Relation (Age of Onset)    Arthritis-rheumatoid Father, Brother    Congestive Heart Failure Mother    Coronary Artery Disease Father            Social Hx:   Social  History     Socioeconomic History   . Marital status: Married     Spouse name: Pamala Hurry   . Number of children: 2   . Years of education: Not on file   . Highest education level: Not on file   Occupational History   . Occupation: Diabled dispatcher   Social Needs   . Financial resource strain: Not on file   . Food insecurity:     Worry: Not on file     Inability: Not on file   . Transportation needs:     Medical: Not on file     Non-medical: Not on file   Tobacco Use   . Smoking status: Former Smoker     Types: Cigarettes     Last attempt to quit: 2012  Years since quitting: 7.6   . Smokeless tobacco: Never Used   Substance and Sexual Activity   . Alcohol use: Yes     Frequency: 2-4 times a month   . Drug use: Never   . Sexual activity: Not on file   Lifestyle   . Physical activity:     Days per week: Not on file     Minutes per session: Not on file   . Stress: Not on file   Relationships   . Social connections:     Talks on phone: Not on file     Gets together: Not on file     Attends religious service: Not on file     Active member of club or organization: Not on file     Attends meetings of clubs or organizations: Not on file     Relationship status: Not on file   . Intimate partner violence:     Fear of current or ex partner: Not on file     Emotionally abused: Not on file     Physically abused: Not on file     Forced sexual activity: Not on file   Other Topics Concern   . Not on file   Social History Narrative    Resides with wife     Medications:   No current outpatient medications on file.     Allergies: No Known Allergies    ROS:  All 10 systems were reviewed.  There were pertinent positives in: Genital urinary system  There are no new changes to the prior reviewed review of systems.    Physical Exam:  There were no vitals taken for this visit.      General - Alert/oriented; NAD  Neck - Supple, trachea midline  Lungs - Respirations are unlabored, good inspiratory effort  Abdomen - Non-distended      Musculoskeletal - Four extremities intact  Neurological - CN II-XII Grossly intact  GU system - Deferred. Plan to examine in OR    Assessment:  62 y.o. male with:    1. Urinary retention managed with indwelling Foley  2. History of UTI  3. Stricture of the fossa navicularis status post dilatation to 58 [place] with [facility] dilators  4. Deconditioning    Plan:  To OR for cystoscopy and urodynamics   Consent obtained  The patient understands the risks of the procedure, which include bleeding, infection, recurrence, damage to the surrounding structures, failure to correct pathology, loss of sensation, and the risks of anesthesia (heart attack, stroke, death, etc.)    All questions of patient and family answered    Carlena Hurl, MD 05/07/2018, 08:01  [facility]  Department of Urology Resident       I saw and examined the patient.  I reviewed the resident's note.  I agree with the findings and plan of care as documented in the resident's note.  Any exceptions/additions are edited/noted.    Thurmon Fair, MD  Raymond Gurney., MD, FACS  Associate Residency Program Director  Associate Professor of Urology  [facility] Department of Urology

## 2018-05-07 NOTE — OR Surgeon (Signed)
[place] OF UROLOGY   OPERATION SUMMARY     PATIENT NAME: [place] NUMBER: [id number]   DATE OF SERVICE: 05/07/2018   DATE OF BIRTH: [birth year]     PREOPERATIVE DIAGNOSIS:   1. Urinary retentionmanaged with indwelling Foley catheter  2. History of recurrent UTI's  3. Stricture of the fossa navicularis status post dilatation to 17 [place] with [facility] dilators  4. De-conditioning with multiple co-morbidities (see problem list)    POSTOPERATIVE DIAGNOSIS: Same     NAME OF PROCEDURE:   1. Complex Cystometrogram    2. Flexible cystourethroscopy     FINDINGS:   1. Normal cystoscopy with "catheter cystitis"     2. Small bladder capacity ~ 50 ml   5. No masses, lesions, or stones noted within the bladder lumen  7. Bilateral ureteral orifices in normal anatomic position with good efflux   8. Fossa navicularis, pendulous, and bulbar urethra appear widely patent without evidence of lesions or strictures  9. Good circumferential coaptation at level of membranous urethra (external urethral sphincter)  10. Bladder and urethral mucosa appear uniformly smooth, pinkish in color, and flat without any suspicious areas of hyperemia, except for catheter cystitis .     11. Moderate BPH  12. Complex Cystometrogram- bladder filled to 50 ml with immediate release of fluid x 2    IF ENLARGED PROSTATE  Moderate BPH with protrusion into the prostatic urethra and intravesically     SURGEON(S): Thurmon Fair, MD   RESIDENT(S): Carlena Hurl, MD    ANESTHESIA: 2% lidocaine gel.   ESTIMATED BLOOD LOSS: None.   COMPLICATIONS: None.    INDICATIONS FOR PROCEDURE: Andrew Arias is a 62 y.o. male with a history of urinary retention and indwelling foley who presents for cystoscopy and urodynamics. The patient was last seen in clinic on 01/10/18. No new issues.    DESCRIPTION OF PROCEDURE: The patient was consented preoperatively and all risks and benefits of the procedure were explained including bleeding,  infection, pain, bladder and urethral injury. He was then brought back to the cystoscopy suite and positioned for the urodynamic study. A 7-French urodynamics catheter was placed through the urethra and secured to the thigh with a piece of tape. Patch EMG electrodes were placed on either side of the perineum and a small rectal tube was placed to monitor abdominal pressure. Medium fill cystometry was undertaken. Release of bladder fluid at 50 ml of infusion x 2. Patient felt full at only 50 ml of bladder fluid.    Then, he was laid in the supine position and his genitalia were prepped and draped in the standard sterile fashion. 10 cc of 2% Lidocaine Uro-Jet were infused into the urethra. We passed a 17-French flexible cystoscope through the male urethra and into the bladder under direct visualization. Upon entering the bladder, panendoscopic views were obtained. Findings noted above. The patient tolerated this procedure well and was then taken back to the post-procedure recovery area in stable condition. Dr. Thurmon Fair was present and participated in the entire procedure    DISPOSITION: The patient will be discharged home. He was given post-procedure antibiotics. He will follow-up in one month for Foley catheter exchange.    Carlena Hurl, MD   [facility]  Division of Urology   05/07/2018, 11:41     I was present, participated and supervised the entire procedure.  Thurmon Fair, MD  05/07/2018, 14:41  Raymond Gurney., MD, FACS  Associate Residency Program Director  Associate Professor of Urology  [facility] Department of Urology

## 2018-06-03 ENCOUNTER — Ambulatory Visit (INDEPENDENT_AMBULATORY_CARE_PROVIDER_SITE_OTHER): Payer: Self-pay | Admitting: Cardiovascular Disease

## 2018-06-03 DIAGNOSIS — I482 Chronic atrial fibrillation, unspecified: Secondary | ICD-10-CM | POA: Insufficient documentation

## 2018-06-03 DIAGNOSIS — Z7901 Long term (current) use of anticoagulants: Secondary | ICD-10-CM

## 2018-06-03 NOTE — Nursing Note (Signed)
3 mg daily. INR 06/10/2018. No change. [facility] RN informed.

## 2018-06-05 ENCOUNTER — Ambulatory Visit (INDEPENDENT_AMBULATORY_CARE_PROVIDER_SITE_OTHER): Payer: Self-pay | Admitting: Cardiovascular Disease

## 2018-06-05 DIAGNOSIS — I482 Chronic atrial fibrillation, unspecified: Secondary | ICD-10-CM

## 2018-06-05 DIAGNOSIS — Z029 Encounter for administrative examinations, unspecified: Secondary | ICD-10-CM

## 2018-06-05 DIAGNOSIS — Z7901 Long term (current) use of anticoagulants: Secondary | ICD-10-CM

## 2018-06-05 NOTE — Progress Notes (Signed)
Error

## 2018-06-05 NOTE — Addendum Note (Signed)
Addended by: Sanjuan Dame on: 06/05/2018 09:53 AM     Modules accepted: Level of Service, SmartSet

## 2018-06-05 NOTE — Nursing Note (Signed)
Erroneous encounter

## 2018-06-14 ENCOUNTER — Encounter (INDEPENDENT_AMBULATORY_CARE_PROVIDER_SITE_OTHER): Payer: Self-pay | Admitting: Urology

## 2018-07-01 ENCOUNTER — Inpatient Hospital Stay (HOSPITAL_COMMUNITY)
Admission: EM | Admit: 2018-07-01 | Discharge: 2018-07-01 | Disposition: A | Discharge status: Home / Self Care | Payer: Self-pay | Source: Other Acute Inpatient Hospital

## 2018-07-01 DIAGNOSIS — I509 Heart failure, unspecified: Secondary | ICD-10-CM

## 2018-07-01 DIAGNOSIS — N189 Chronic kidney disease, unspecified: Secondary | ICD-10-CM

## 2018-07-01 DIAGNOSIS — J9601 Acute respiratory failure with hypoxia: Secondary | ICD-10-CM

## 2018-07-01 DIAGNOSIS — R0602 Shortness of breath: Secondary | ICD-10-CM

## 2018-07-01 DIAGNOSIS — Z992 Dependence on renal dialysis: Secondary | ICD-10-CM

## 2018-07-01 DIAGNOSIS — N186 End stage renal disease: Secondary | ICD-10-CM

## 2018-07-01 DIAGNOSIS — E8770 Fluid overload, unspecified: Secondary | ICD-10-CM

## 2018-07-03 DIAGNOSIS — R0602 Shortness of breath: Secondary | ICD-10-CM

## 2018-07-03 DIAGNOSIS — R079 Chest pain, unspecified: Secondary | ICD-10-CM

## 2018-07-08 ENCOUNTER — Inpatient Hospital Stay (HOSPITAL_COMMUNITY)
Admission: EM | Admit: 2018-07-08 | Discharge: 2018-07-08 | Disposition: A | Discharge status: Home / Self Care | Payer: Self-pay | Source: Other Acute Inpatient Hospital

## 2018-07-08 DIAGNOSIS — R4182 Altered mental status, unspecified: Secondary | ICD-10-CM

## 2018-07-08 DIAGNOSIS — I959 Hypotension, unspecified: Secondary | ICD-10-CM

## 2018-07-08 DIAGNOSIS — E119 Type 2 diabetes mellitus without complications: Secondary | ICD-10-CM

## 2018-07-08 DIAGNOSIS — Z87891 Personal history of nicotine dependence: Secondary | ICD-10-CM

## 2018-07-08 DIAGNOSIS — N186 End stage renal disease: Secondary | ICD-10-CM

## 2018-07-08 DIAGNOSIS — J189 Pneumonia, unspecified organism: Secondary | ICD-10-CM

## 2018-07-08 DIAGNOSIS — I509 Heart failure, unspecified: Secondary | ICD-10-CM

## 2018-07-09 DIAGNOSIS — I429 Cardiomyopathy, unspecified: Secondary | ICD-10-CM

## 2018-07-09 DIAGNOSIS — R7881 Bacteremia: Secondary | ICD-10-CM

## 2018-07-09 DIAGNOSIS — A419 Sepsis, unspecified organism: Secondary | ICD-10-CM

## 2018-07-09 DIAGNOSIS — I10 Essential (primary) hypertension: Secondary | ICD-10-CM

## 2018-07-09 DIAGNOSIS — Z992 Dependence on renal dialysis: Secondary | ICD-10-CM

## 2018-07-09 DIAGNOSIS — B958 Unspecified staphylococcus as the cause of diseases classified elsewhere: Secondary | ICD-10-CM

## 2018-07-09 DIAGNOSIS — L0390 Cellulitis, unspecified: Secondary | ICD-10-CM

## 2018-07-09 DIAGNOSIS — J690 Pneumonitis due to inhalation of food and vomit: Secondary | ICD-10-CM

## 2018-07-11 DIAGNOSIS — I5042 Chronic combined systolic (congestive) and diastolic (congestive) heart failure: Secondary | ICD-10-CM

## 2018-07-11 DIAGNOSIS — E785 Hyperlipidemia, unspecified: Secondary | ICD-10-CM

## 2018-07-11 DIAGNOSIS — I428 Other cardiomyopathies: Secondary | ICD-10-CM

## 2018-07-11 DIAGNOSIS — R509 Fever, unspecified: Secondary | ICD-10-CM

## 2018-07-11 DIAGNOSIS — I110 Hypertensive heart disease with heart failure: Secondary | ICD-10-CM

## 2018-07-11 DIAGNOSIS — I482 Chronic atrial fibrillation, unspecified: Secondary | ICD-10-CM

## 2018-08-02 ENCOUNTER — Other Ambulatory Visit (HOSPITAL_COMMUNITY): Payer: Self-pay | Admitting: Specialist

## 2018-08-02 ENCOUNTER — Other Ambulatory Visit (HOSPITAL_COMMUNITY): Payer: Self-pay

## 2018-08-02 DIAGNOSIS — N186 End stage renal disease: Secondary | ICD-10-CM

## 2018-08-02 DIAGNOSIS — N19 Unspecified kidney failure: Secondary | ICD-10-CM

## 2018-08-26 ENCOUNTER — Encounter (HOSPITAL_COMMUNITY): Payer: Self-pay | Admitting: Specialist

## 2018-08-26 ENCOUNTER — Encounter (HOSPITAL_COMMUNITY): Payer: Self-pay

## 2018-08-29 ENCOUNTER — Inpatient Hospital Stay (HOSPITAL_COMMUNITY)
Admission: EM | Admit: 2018-08-29 | Discharge: 2018-08-29 | Disposition: A | Discharge status: Home / Self Care | Payer: No Typology Code available for payment source | Source: Other Acute Inpatient Hospital

## 2018-08-29 DIAGNOSIS — I509 Heart failure, unspecified: Secondary | ICD-10-CM

## 2018-08-29 DIAGNOSIS — E8770 Fluid overload, unspecified: Secondary | ICD-10-CM

## 2018-08-29 DIAGNOSIS — J9690 Respiratory failure, unspecified, unspecified whether with hypoxia or hypercapnia: Secondary | ICD-10-CM

## 2018-08-29 DIAGNOSIS — N186 End stage renal disease: Secondary | ICD-10-CM

## 2018-08-29 DIAGNOSIS — J069 Acute upper respiratory infection, unspecified: Secondary | ICD-10-CM

## 2018-08-29 DIAGNOSIS — J9611 Chronic respiratory failure with hypoxia: Secondary | ICD-10-CM

## 2018-08-29 DIAGNOSIS — I5020 Unspecified systolic (congestive) heart failure: Secondary | ICD-10-CM

## 2018-08-30 DIAGNOSIS — R0602 Shortness of breath: Secondary | ICD-10-CM

## 2018-09-02 DIAGNOSIS — Z5181 Encounter for therapeutic drug level monitoring: Secondary | ICD-10-CM

## 2018-09-03 ENCOUNTER — Other Ambulatory Visit (INDEPENDENT_AMBULATORY_CARE_PROVIDER_SITE_OTHER): Payer: Self-pay

## 2018-09-03 DIAGNOSIS — I429 Cardiomyopathy, unspecified: Secondary | ICD-10-CM | POA: Insufficient documentation

## 2018-09-03 DIAGNOSIS — Z951 Presence of aortocoronary bypass graft: Secondary | ICD-10-CM | POA: Insufficient documentation

## 2018-09-03 DIAGNOSIS — L89514 Pressure ulcer of right ankle, stage 4: Secondary | ICD-10-CM | POA: Insufficient documentation

## 2018-09-03 DIAGNOSIS — R7881 Bacteremia: Secondary | ICD-10-CM | POA: Insufficient documentation

## 2018-09-03 DIAGNOSIS — G4730 Sleep apnea, unspecified: Secondary | ICD-10-CM | POA: Insufficient documentation

## 2018-09-03 DIAGNOSIS — R0989 Other specified symptoms and signs involving the circulatory and respiratory systems: Secondary | ICD-10-CM | POA: Insufficient documentation

## 2018-09-03 DIAGNOSIS — I10 Essential (primary) hypertension: Secondary | ICD-10-CM | POA: Insufficient documentation

## 2018-09-03 DIAGNOSIS — Z79899 Other long term (current) drug therapy: Secondary | ICD-10-CM | POA: Insufficient documentation

## 2018-09-03 DIAGNOSIS — E7800 Pure hypercholesterolemia, unspecified: Secondary | ICD-10-CM | POA: Insufficient documentation

## 2018-09-03 DIAGNOSIS — R5383 Other fatigue: Secondary | ICD-10-CM | POA: Insufficient documentation

## 2018-09-03 DIAGNOSIS — I509 Heart failure, unspecified: Secondary | ICD-10-CM | POA: Insufficient documentation

## 2018-09-03 DIAGNOSIS — I5022 Chronic systolic (congestive) heart failure: Secondary | ICD-10-CM | POA: Insufficient documentation

## 2018-09-03 DIAGNOSIS — I517 Cardiomegaly: Secondary | ICD-10-CM | POA: Insufficient documentation

## 2018-09-03 DIAGNOSIS — R0600 Dyspnea, unspecified: Secondary | ICD-10-CM | POA: Insufficient documentation

## 2018-09-03 DIAGNOSIS — I2510 Atherosclerotic heart disease of native coronary artery without angina pectoris: Secondary | ICD-10-CM | POA: Insufficient documentation

## 2018-09-03 DIAGNOSIS — E119 Type 2 diabetes mellitus without complications: Secondary | ICD-10-CM | POA: Insufficient documentation

## 2018-09-03 DIAGNOSIS — I340 Nonrheumatic mitral (valve) insufficiency: Secondary | ICD-10-CM | POA: Insufficient documentation

## 2018-09-03 DIAGNOSIS — Z9861 Coronary angioplasty status: Secondary | ICD-10-CM | POA: Insufficient documentation

## 2018-09-03 DIAGNOSIS — I472 Ventricular tachycardia, unspecified: Secondary | ICD-10-CM | POA: Insufficient documentation

## 2018-09-03 DIAGNOSIS — Z95810 Presence of automatic (implantable) cardiac defibrillator: Secondary | ICD-10-CM | POA: Insufficient documentation

## 2018-09-03 DIAGNOSIS — R9430 Abnormal result of cardiovascular function study, unspecified: Secondary | ICD-10-CM | POA: Insufficient documentation

## 2018-09-03 DIAGNOSIS — R161 Splenomegaly, not elsewhere classified: Secondary | ICD-10-CM | POA: Insufficient documentation

## 2018-09-03 DIAGNOSIS — I428 Other cardiomyopathies: Secondary | ICD-10-CM | POA: Insufficient documentation

## 2018-09-03 DIAGNOSIS — I272 Pulmonary hypertension, unspecified: Secondary | ICD-10-CM | POA: Insufficient documentation

## 2018-09-03 DIAGNOSIS — R609 Edema, unspecified: Secondary | ICD-10-CM | POA: Insufficient documentation

## 2018-09-04 DIAGNOSIS — J690 Pneumonitis due to inhalation of food and vomit: Secondary | ICD-10-CM

## 2018-09-05 ENCOUNTER — Encounter (INDEPENDENT_AMBULATORY_CARE_PROVIDER_SITE_OTHER): Payer: Self-pay | Admitting: Cardiovascular Disease

## 2018-09-27 ENCOUNTER — Encounter (HOSPITAL_COMMUNITY): Payer: Self-pay

## 2018-09-27 ENCOUNTER — Encounter (HOSPITAL_COMMUNITY): Payer: Self-pay | Admitting: Vascular Surgery

## 2018-09-27 ENCOUNTER — Encounter (INDEPENDENT_AMBULATORY_CARE_PROVIDER_SITE_OTHER): Payer: Self-pay | Admitting: Cardiovascular Disease

## 2018-09-28 DEATH — deceased

## 2018-09-30 NOTE — Care Management Notes (Signed)
Referral Information  ++++++ Placed Provider #1 ++++++  Case Manager: Guerry Bruin  Provider Type: DME  Provider Name: [facility]  Address:  [address] [place] [address]  Contact: Beulah Gandy    Phone: [phone] x  Fax:   Fax: [phone]

## 2019-12-31 IMAGING — CT CT ABD/PEL WO CONT
2 of 4 series · 17 of 46 positions shown, 19 images · non-contrast
Comparison: None available.

HISTORY: Generalized abdominal pain.
TECHNIQUE: Axial images of the abdomen and pelvis are obtained from the hemidiaphragms to the pubic symphysis without intravenous contrast. Coronal and sagittal images were reformatted.

[Series 2: soft tissue · axial · 0.79mm/px · z∈[-461,-36]mm · 14 of 93 slices shown, 16 images]
[im 4/93  soft-tissue]
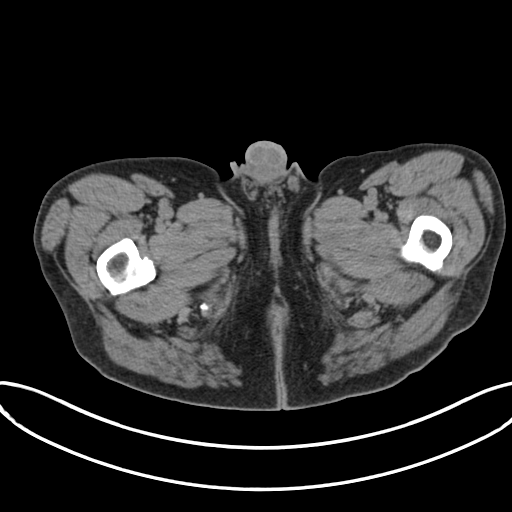
[im 4/93  bone]
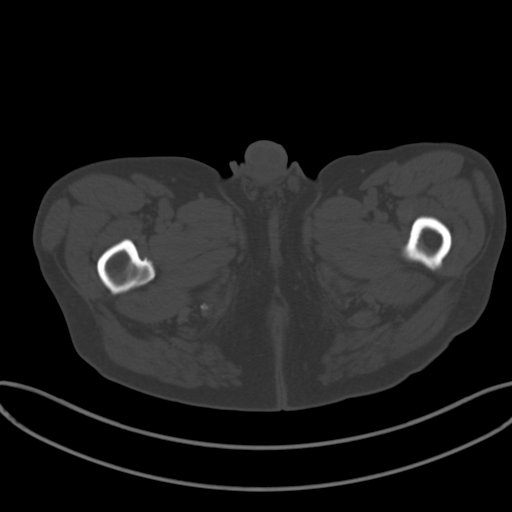
[im 12/93  soft-tissue]
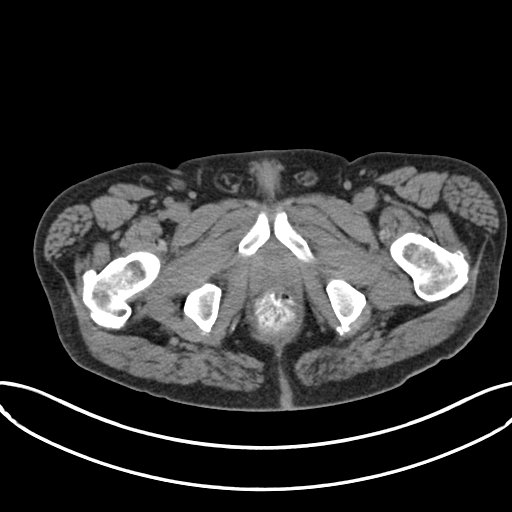
[im 20/93  soft-tissue]
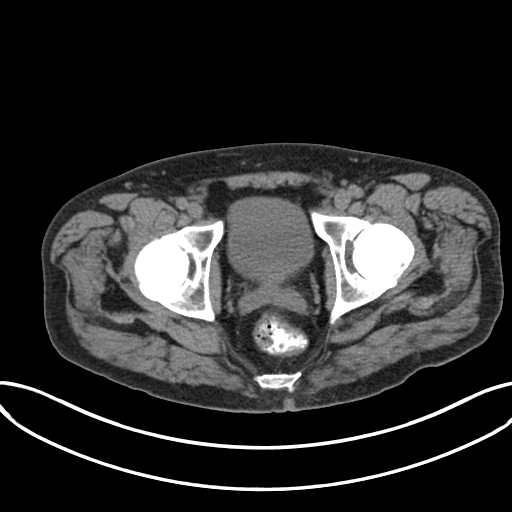
[im 24/93  soft-tissue]
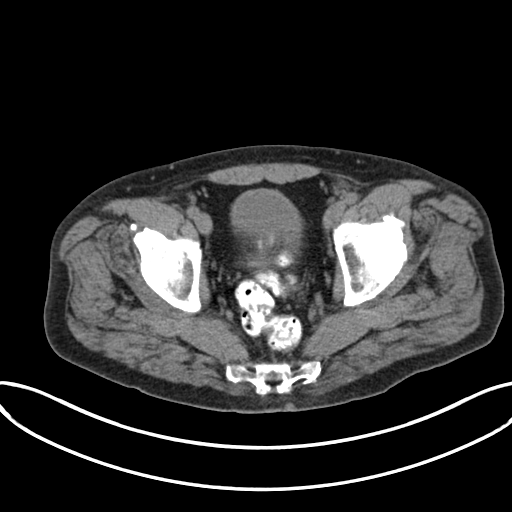
[im 31/93  soft-tissue]
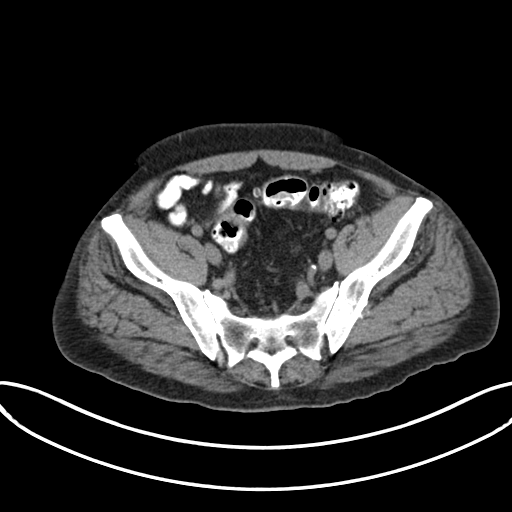
[im 39/93  soft-tissue]
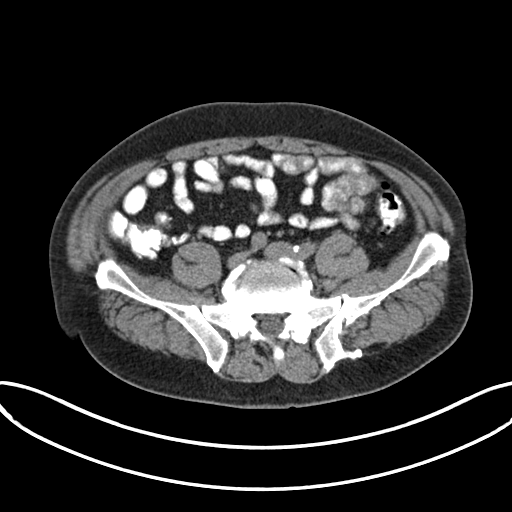
[im 43/93  soft-tissue]
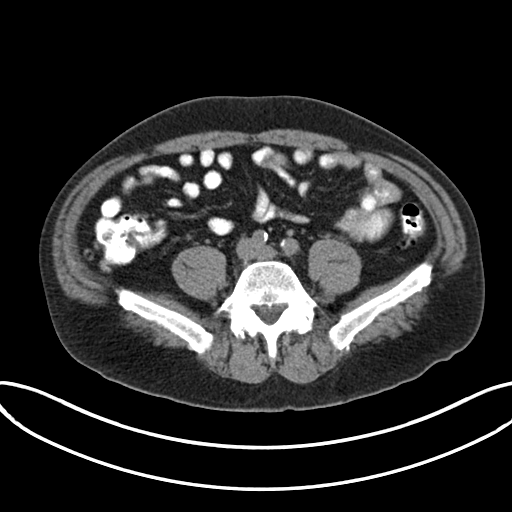
[im 50/93  soft-tissue]
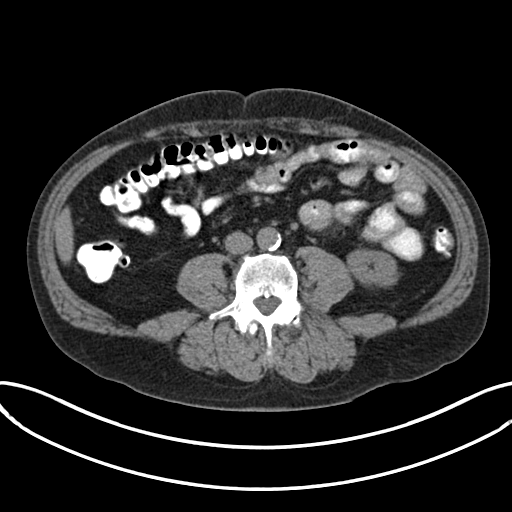
[im 54/93  soft-tissue]
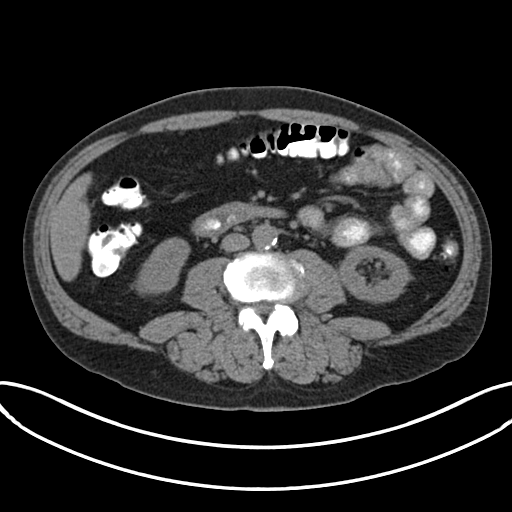
[im 54/93  bone]
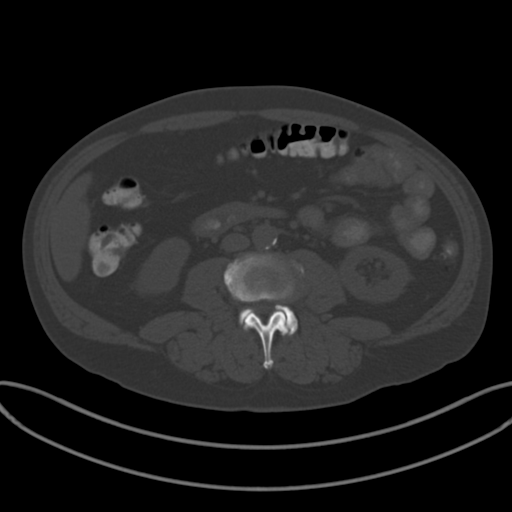
[im 62/93  soft-tissue]
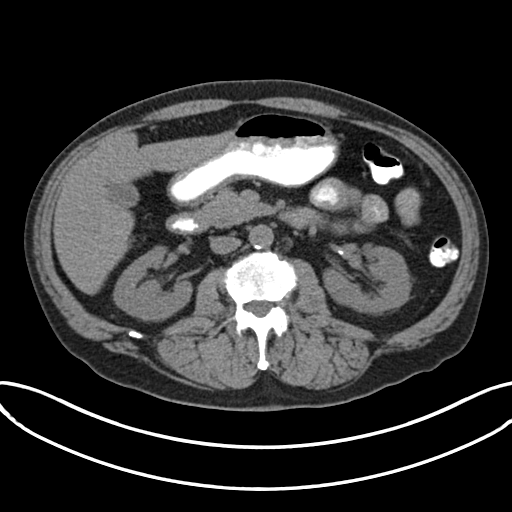
[im 70/93  soft-tissue]
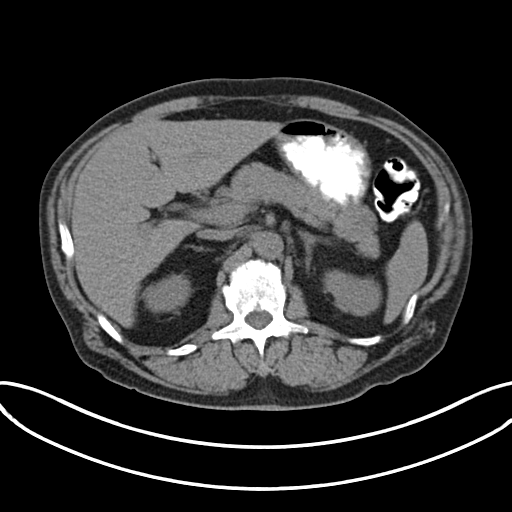
[im 73/93  soft-tissue]
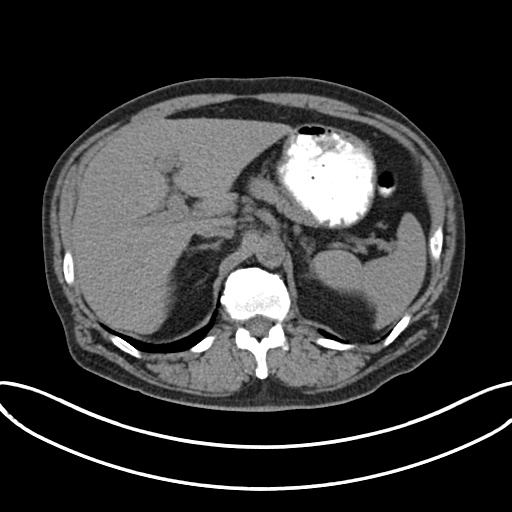
[im 81/93  soft-tissue]
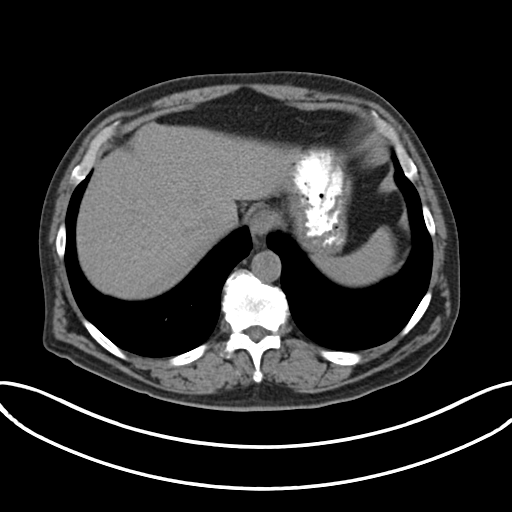
[im 89/93  soft-tissue]
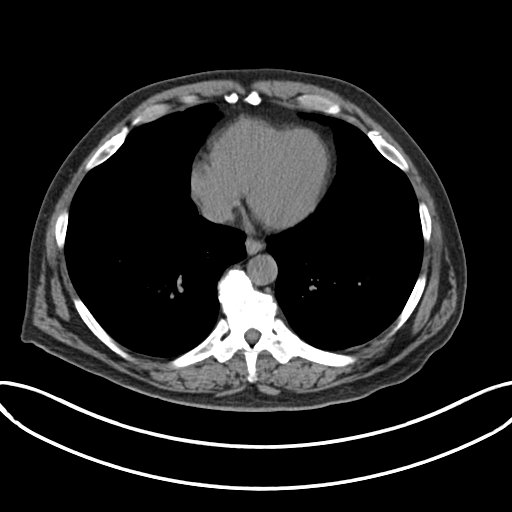

[Series 4: coronal · coronal · 0.89mm/px · 3 of 55 slices shown]
[im 19/55  soft-tissue]
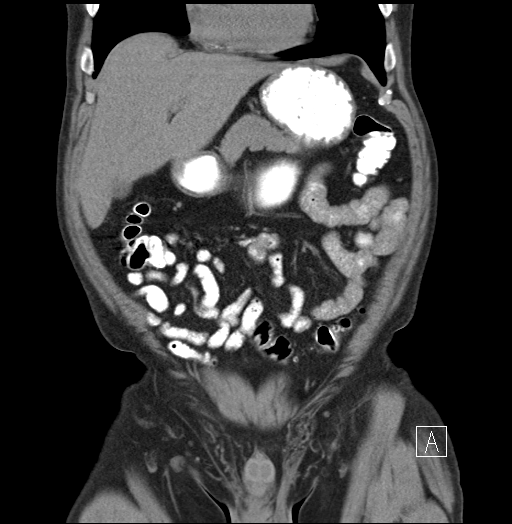
[im 25/55  soft-tissue]
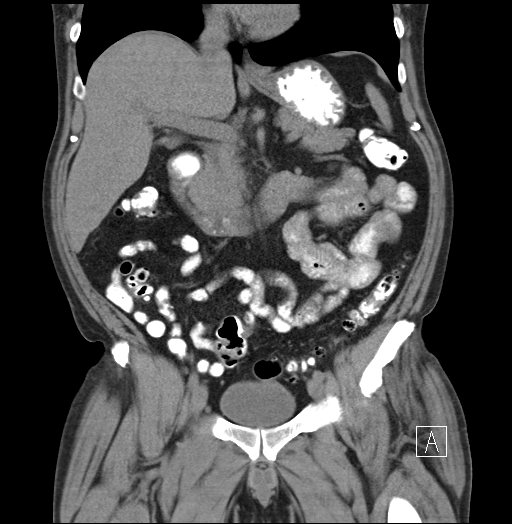
[im 31/55  soft-tissue]
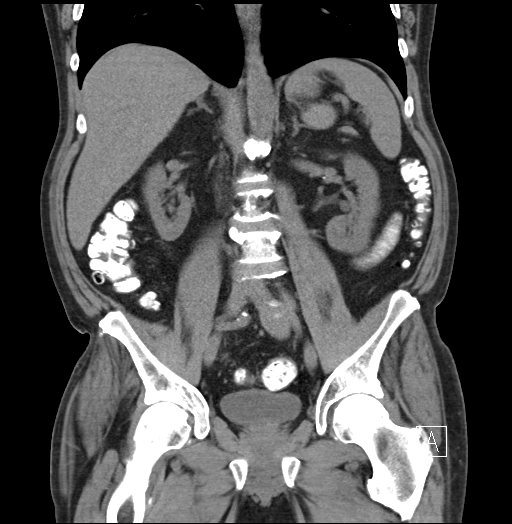

[17 of 46 positions shown; findings below may reference images not displayed]

FINDINGS: Visualized lung bases are clear. Visualized cardiac apex demonstrates coronary artery calcifications.

Liver, gallbladder, pancreas, spleen, adrenal glands, and kidneys are unremarkable. Urinary bladder is unremarkable. 

Mildly prominent prostate, transverse diameter 50 mm.

Minimal sigmoid diverticula without overt diverticulitis.

No evidence for appendicitis. Appendix is unremarkable.

No evidence for small bowel obstruction.

No ascites or free air.

The osseous structures are unremarkable.
IMPRESSION: No CT evidence for acute intra-abdominal abnormality.

Colonic diverticula without diverticulitis.

Mildly prominent prostate.

Total radiation dose to patient is CTDIvol 10.01 mGy and DLP 473.90 mGy-cm.
